# Patient Record
Sex: Female | Born: 1970 | Race: White | Hispanic: No | Marital: Married | Smoking: Never smoker
Health system: Southern US, Community
[De-identification: ages and names within clinical notes are randomized; demographics above are authoritative.]

## PROBLEM LIST (undated history)

## (undated) ENCOUNTER — Inpatient Hospital Stay (HOSPITAL_COMMUNITY): Payer: Self-pay

## (undated) DIAGNOSIS — N83209 Unspecified ovarian cyst, unspecified side: Secondary | ICD-10-CM

## (undated) DIAGNOSIS — R87629 Unspecified abnormal cytological findings in specimens from vagina: Secondary | ICD-10-CM

## (undated) DIAGNOSIS — E119 Type 2 diabetes mellitus without complications: Secondary | ICD-10-CM

## (undated) DIAGNOSIS — B999 Unspecified infectious disease: Secondary | ICD-10-CM

## (undated) DIAGNOSIS — O2200 Varicose veins of lower extremity in pregnancy, unspecified trimester: Secondary | ICD-10-CM

## (undated) HISTORY — PX: OVARIAN CYST REMOVAL: SHX89

## (undated) HISTORY — DX: Type 2 diabetes mellitus without complications: E11.9

## (undated) HISTORY — PX: OOPHORECTOMY: SHX86

---

## 2014-08-15 LAB — OB RESULTS CONSOLE ABO/RH: RH Type: POSITIVE

## 2014-08-15 LAB — OB RESULTS CONSOLE RUBELLA ANTIBODY, IGM: RUBELLA: NON-IMMUNE/NOT IMMUNE

## 2014-08-15 LAB — OB RESULTS CONSOLE HIV ANTIBODY (ROUTINE TESTING): HIV: NONREACTIVE

## 2014-08-15 LAB — OB RESULTS CONSOLE RPR: RPR: NONREACTIVE

## 2014-08-15 LAB — OB RESULTS CONSOLE HEPATITIS B SURFACE ANTIGEN: HEP B S AG: NEGATIVE

## 2014-08-15 LAB — OB RESULTS CONSOLE ANTIBODY SCREEN: Antibody Screen: NEGATIVE

## 2014-08-17 LAB — OB RESULTS CONSOLE GBS: STREP GROUP B AG: NEGATIVE

## 2014-09-05 ENCOUNTER — Encounter: Payer: BC Managed Care – PPO | Attending: Obstetrics and Gynecology | Admitting: Nutrition

## 2014-09-05 ENCOUNTER — Encounter: Payer: Self-pay | Admitting: Nutrition

## 2014-09-05 VITALS — Ht 65.0 in | Wt 192.0 lb

## 2014-09-05 DIAGNOSIS — Z713 Dietary counseling and surveillance: Secondary | ICD-10-CM | POA: Insufficient documentation

## 2014-09-05 DIAGNOSIS — E119 Type 2 diabetes mellitus without complications: Secondary | ICD-10-CM | POA: Insufficient documentation

## 2014-09-05 DIAGNOSIS — IMO0002 Reserved for concepts with insufficient information to code with codable children: Secondary | ICD-10-CM

## 2014-09-05 DIAGNOSIS — E118 Type 2 diabetes mellitus with unspecified complications: Secondary | ICD-10-CM

## 2014-09-05 DIAGNOSIS — E1165 Type 2 diabetes mellitus with hyperglycemia: Secondary | ICD-10-CM

## 2014-09-05 DIAGNOSIS — O24312 Unspecified pre-existing diabetes mellitus in pregnancy, second trimester: Secondary | ICD-10-CM | POA: Diagnosis present

## 2014-09-05 DIAGNOSIS — IMO0001 Reserved for inherently not codable concepts without codable children: Secondary | ICD-10-CM

## 2014-09-05 NOTE — Progress Notes (Signed)
  Medical Nutrition Therapy:  Appt start time: 0830 end time:  0930.   Assessment:  Primary concerns today:  Type 2 Diabetes and currently pregnant.  Here with her mom. Late age pregnancy-high risk at 43 years of age. Currently [redacted] weeks pregnant with a boy. Checks her BS Fasting and 2 hours after meals/snacks. FBS: 112 ng/dl and 657115 mg/dl.  89 before snack, 94-110 mg/dl  2 hrs after meals. Previously was on Janumet for the last year amd FBS were abpit 130 mg/dl.  Prepregnancy weight: 197 lbs. Lost 5 lbs since being pregnant. A1c was 7% before pregnancy. This is her first pregnancy. Current diet is eating three meals a day but not really any snacks and was restricting carbohydrates too much. Denies any significant low blood sugars. Currently taking 1500 mg of Metformin daily and Prenatal vitamins.  Preferred Learning Style:    Visual  Hands on  No preference indicated   Learning Readiness:     Ready  Change in progress EDICATIONS: see list   DIETARY INTAKE:  24-hr recall:  B ( AM):  1 cup of cherrios and 1/2 c whole milk, coffee with 2 splenda, tbls ff creamer Snk ( AM): apple,  L ( PM): Salad OR Hamburger patty and broccoli, chicken salad and fruit cup, unsweet tea and water:  Chicken nuggets, 5 and small slaw. Snk ( PM): none D ( PM): string beans, pork chop, 3 oz , Snk ( PM): 2 sugar free chocolate cookies Beverages: water, unsweet tea, coffee  Usual physical activity: walks during her lunch hour for 15-20 minutes.  Estimated energy needs: 2000 calories 225 g carbohydrates 150 g protein 56 g fat  Progress Towards Goal(s):  In progress.   Nutritional Diagnosis:  NB-1.1 Food and nutrition-related knowledge deficit As related to Type 2 Diabetes and being pregnant.  As evidenced by A1C 7% and [redacted] weeks pregnant..    Intervention:  Nutrition counseling for diabetes during pregnancy, meal planning, CHO counting, portion control and target ranges for BS during  pregnancy.  Plan:  Aim for 2-3 Carb Choices per meal (30-45 grams) ; 30 g for breakfast and 45-50 g for lunch and dinner each. Aim for 30 g Carbs  Plus 1 oz of protein per snack between meals .Include protein in moderation with your meals and snacks Consider reading food labels for Total Carbohydrate  Consider  increasing your activity level by 30 minutes daily as tolerated Continue checking BG at alternate times per day as directed by MD  Try to keep 1 hr BS less than 140 or the 2 hour BS after meal less than 120 mg/dl. Goal: Keep BS WNL during pregnancy 2. Gain 1-2 lbs per month for normal pregnancy weight gain. Avoid weight loss 3. Talk to OBGYN about high risk clinic due to age, DM and pregnancy. Follow Prenatal meal plan as outlined. Talk to OBGYN about prescriptions for additional strips for testing.  Teaching Method Utilized:  Visual Auditory Hands on  Handouts given during visit include:      Diabetes During Pregnancy Booklet      My Plate Carb Counting and Food Label handouts Meal Plan Card   Barriers to learning/adherence to lifestyle change: none  Demonstrated degree of understanding via:  Teach Back   Monitoring/Evaluation:  Dietary intake, exercise, meal planning, SBG, and body weight in 2 week(s).

## 2014-09-05 NOTE — Patient Instructions (Signed)
Plan:  Aim for 2-3 Carb Choices per meal (30-45 grams) ; 30 g for breakfast and 45-50 g for lunch and dinner each. Aim for 30 g Carbs  Plus 1 oz of protein per snack between meals .Include protein in moderation with your meals and snacks Consider reading food labels for Total Carbohydrate  Consider  increasing your activity level by 30 minutes daily as tolerated Continue checking BG at alternate times per day as directed by MD  Goal: Keep BS WNL during pregnancy 2. Gain 1-2 lbs per month for normal pregnancy weight gain. Avoid weight loss 3. Talk to OBGYN about high risk clinic due to age, DM and pregnancy. Follow Prenatal meal plan as outlined. Talk to OBGYN about prescriptions for additional strips for testing.

## 2014-09-17 ENCOUNTER — Encounter: Payer: BC Managed Care – PPO | Attending: Obstetrics and Gynecology | Admitting: Nutrition

## 2014-09-17 DIAGNOSIS — O24419 Gestational diabetes mellitus in pregnancy, unspecified control: Secondary | ICD-10-CM | POA: Diagnosis not present

## 2014-09-17 DIAGNOSIS — Z713 Dietary counseling and surveillance: Secondary | ICD-10-CM | POA: Diagnosis not present

## 2014-09-17 DIAGNOSIS — O24919 Unspecified diabetes mellitus in pregnancy, unspecified trimester: Secondary | ICD-10-CM | POA: Diagnosis present

## 2014-09-17 DIAGNOSIS — O24912 Unspecified diabetes mellitus in pregnancy, second trimester: Secondary | ICD-10-CM

## 2014-09-17 NOTE — Progress Notes (Signed)
  Medical Nutrition Therapy:  Appt start time: 0830 end time:  0930.   Assessment:  Primary concerns today:  Type 2 Diabetes and currently pregnant. BS Log brought in FBS: 107-130's. After  Breakfast 91-117 2 hrs after lunch:  109-140 2 hrs after supper:   85-136. 98% of blood sugars are WNL. She is 19 weeks 2 days pregnant. U/S was done and everything is on target. Gain 2 lbs since visit.  Denies any significant low blood sugars. She can tell when her blood sugars is getting low and it's time to eat. Appetite good. Sticking with eating 30-45 g of CHO at meals and 15-30 g for snacks between meals. Getting regular exercise by walking when it's not raining.Currently taking 1500 mg of Metformin daily and Prenatal vitamins.  Preferred Learning Style:   Visual  Hands on  No preference indicated   Learning Readiness:   Ready  Change in progress EDICATIONS: see list   DIETARY INTAKE:  24-hr recall:  B ( AM):  Whole wheat toast 2 slices and pb.  Snk ( AM): apple, cheese stick L ( PM): walnut chicken salad,  1 cup fruit 1 crissant  Snk ( PM): yogurt D ( PM): [place] stew 1 cup, apple, water Snk ( PM): small bowl of cherrios. Beverages: water, unsweet tea, coffee  Usual physical activity: walks during her lunch hour for 15-20 minutes.  Estimated energy needs: 2000 calories 225 g carbohydrates 150 g protein 56 g fat  Progress Towards Goal(s):  In progress.   Nutritional Diagnosis:  NB-1.1 Food and nutrition-related knowledge deficit As related to Type 2 Diabetes and being pregnant.  As evidenced by A1C 7% and [redacted] weeks pregnant..    Intervention:  Nutrition counseling for diabetes during pregnancy, meal planning, CHO counting, portion control and target ranges for BS during pregnancy.Discussed hormonal changes during pregnancy and potential need for short duration of insulin during later stages of pregnancy if BS consistently are elevated > 140 mg/dl 2 hours after meals or >100  mg/dl fasting. Encouraged her to discuss with OB GYN.  Plan:  Keep up the good wok!!! Aim for 2-3 Carb Choices per meal (30-45 grams) ; 30 g for breakfast and 45-50 g for lunch and dinner each. Aim for 30 g Carbs  Plus 1 oz of protein per snack between meals .Include protein in moderation with your meals and snacks Consider reading food labels for Total Carbohydrate  Consider  increasing your activity level by 30 minutes daily as tolerated Continue checking BG at alternate times per day as directed by MD  Try to keep 1 hr BS less than 140 or the 2 hour BS after meal less than 120 mg/dl. Goal: Keep BS WNL during pregnancy 2. Gain 1-2 lbs per month for normal pregnancy weight gain. Avoid weight loss   Teaching Method Utilized:  Visual Auditory Hands on  Handouts given during visit include:      Diabetes During Pregnancy Booklet      My Plate Carb Counting and Food Label handouts Meal Plan Card   Barriers to learning/adherence to lifestyle change: none  Demonstrated degree of understanding via:  Teach Back   Monitoring/Evaluation:  Dietary intake, exercise, meal planning, SBG, and body weight PRN.

## 2014-09-17 NOTE — Patient Instructions (Signed)
Keep up the good wok!!! Aim for 2-3 Carb Choices per meal (30-45 grams) ; 30 g for breakfast and 45-50 g for lunch and dinner each. Aim for 30 g Carbs  Plus 1 oz of protein per snack between meals .Include protein in moderation with your meals and snacks Consider reading food labels for Total Carbohydrate  Consider  increasing your activity level by 30 minutes daily as tolerated Continue checking BG at alternate times per day as directed by MD  Try to keep 1 hr BS less than 140 or the 2 hour BS after meal less than 120 mg/dl. Goal: Keep BS WNL during pregnancy 2. Gain 1-2 lbs per month for normal pregnancy weight gain. Avoid weight loss

## 2014-09-21 NOTE — L&D Delivery Note (Signed)
Vtx at +2 station and LOA.  Pt desires VE.  I discussed the R&B of VE including but not limited to injury to fetus but the potential benefit of expedited delivery.  She gives her informed consent and wishes to proceed. On the 2nd pull delivered viable female apgars 8,9 over post vag wall lac   Placenta delivered spontaneously intact with 3VC. Repair with 2-0 Chromic with good support and hemostasis noted and R/V exam confirms.  PH art was sent.  [name] was not done.  Mother and baby were doing well.  EBL 200cc  Gina Campavid Hiyab Nhem, MD

## 2014-10-11 ENCOUNTER — Inpatient Hospital Stay (HOSPITAL_COMMUNITY)
Admission: AD | Admit: 2014-10-11 | Discharge: 2014-10-15 | DRG: 781 | Disposition: A | Discharge status: Home / Self Care | Payer: BLUE CROSS/BLUE SHIELD | Source: Ambulatory Visit | Attending: Obstetrics & Gynecology | Admitting: Obstetrics & Gynecology

## 2014-10-11 ENCOUNTER — Encounter (HOSPITAL_COMMUNITY): Payer: Self-pay | Admitting: *Deleted

## 2014-10-11 DIAGNOSIS — N883 Incompetence of cervix uteri: Secondary | ICD-10-CM

## 2014-10-11 DIAGNOSIS — O09512 Supervision of elderly primigravida, second trimester: Secondary | ICD-10-CM | POA: Diagnosis not present

## 2014-10-11 DIAGNOSIS — Z3A22 22 weeks gestation of pregnancy: Secondary | ICD-10-CM | POA: Diagnosis present

## 2014-10-11 DIAGNOSIS — E119 Type 2 diabetes mellitus without complications: Secondary | ICD-10-CM | POA: Diagnosis present

## 2014-10-11 DIAGNOSIS — O3432 Maternal care for cervical incompetence, second trimester: Secondary | ICD-10-CM | POA: Diagnosis present

## 2014-10-11 DIAGNOSIS — O24112 Pre-existing diabetes mellitus, type 2, in pregnancy, second trimester: Secondary | ICD-10-CM | POA: Diagnosis present

## 2014-10-11 LAB — CBC
HCT: 35.9 % — ABNORMAL LOW (ref 36.0–46.0)
HEMOGLOBIN: 12.4 g/dL (ref 12.0–15.0)
MCH: 29.6 pg (ref 26.0–34.0)
MCHC: 34.5 g/dL (ref 30.0–36.0)
MCV: 85.7 fL (ref 78.0–100.0)
Platelets: 235 10*3/uL (ref 150–400)
RBC: 4.19 MIL/uL (ref 3.87–5.11)
RDW: 13.5 % (ref 11.5–15.5)
WBC: 12.7 10*3/uL — ABNORMAL HIGH (ref 4.0–10.5)

## 2014-10-11 MED ORDER — METFORMIN HCL 500 MG PO TABS
500.0000 mg | ORAL_TABLET | Freq: Three times a day (TID) | ORAL | Status: DC
  Administered 2014-10-12 – 2014-10-15 (×9): 500 mg via ORAL
  Filled 2014-10-11 (×14): qty 1

## 2014-10-11 MED ORDER — DEXTROSE IN LACTATED RINGERS 5 % IV SOLN
INTRAVENOUS | Status: DC
  Administered 2014-10-12 – 2014-10-14 (×2): via INTRAVENOUS

## 2014-10-11 MED ORDER — DOCUSATE SODIUM 100 MG PO CAPS
100.0000 mg | ORAL_CAPSULE | Freq: Every day | ORAL | Status: DC
  Administered 2014-10-13: 100 mg via ORAL
  Filled 2014-10-11: qty 1

## 2014-10-11 MED ORDER — PROGESTERONE MICRONIZED 200 MG PO CAPS
200.0000 mg | ORAL_CAPSULE | Freq: Every day | ORAL | Status: DC
  Administered 2014-10-11 – 2014-10-14 (×4): 200 mg via VAGINAL
  Filled 2014-10-11 (×5): qty 1

## 2014-10-11 MED ORDER — ZOLPIDEM TARTRATE 5 MG PO TABS
5.0000 mg | ORAL_TABLET | Freq: Every evening | ORAL | Status: DC | PRN
  Administered 2014-10-11: 5 mg via ORAL
  Filled 2014-10-11: qty 1

## 2014-10-11 MED ORDER — PRENATAL MULTIVITAMIN CH
1.0000 | ORAL_TABLET | Freq: Every day | ORAL | Status: DC
  Administered 2014-10-12 – 2014-10-13 (×2): 1 via ORAL
  Filled 2014-10-11 (×2): qty 1

## 2014-10-11 MED ORDER — CALCIUM CARBONATE ANTACID 500 MG PO CHEW: 2.0000 | CHEWABLE_TABLET | ORAL | Status: DC | PRN

## 2014-10-11 MED ORDER — ACETAMINOPHEN 325 MG PO TABS
650.0000 mg | ORAL_TABLET | ORAL | Status: DC | PRN
  Administered 2014-10-13: 650 mg via ORAL
  Filled 2014-10-11 (×2): qty 2

## 2014-10-11 NOTE — H&P (Signed)
[name] is a 44 y.o. female presenting for cervical incompetence.  She presented for routine OB appointment today and had ultrasound for completion of anatomy screen.  No measurable cervix was incidentally found.  Patient denies pelvic pressure and CTX.  +FM.  Antepartum course complicated by AMA with normal Panorama.  Also, Type 2 DM controlled with metformin 500 bid.    Maternal Medical History:  Fetal activity: Perceived fetal activity is normal.   Last perceived fetal movement was within the past hour.    Prenatal complications: no prenatal complications Prenatal Complications - Diabetes: type 2. Diabetes is managed by oral agent (monotherapy).      OB History    Gravida Para Term Preterm AB TAB SAB Ectopic Multiple Living   1              Past Medical History  Diagnosis Date  . Diabetes mellitus without complication    Past Surgical History  Procedure Laterality Date  . Ovarian cyst removal    . Oophorectomy     Family History: family history is not on file. Social History:  has no tobacco, alcohol, and drug history on file.   Prenatal Transfer Tool  Maternal Diabetes: Yes:  Diabetes Type:  Pre-pregnancy Genetic Screening: Normal Maternal Ultrasounds/Referrals: Abnormal:  Findings:   Other:no measurable cervix Fetal Ultrasounds or other Referrals:  None Maternal Substance Abuse:  No Significant Maternal Medications:  Meds include: Other: Metformin Significant Maternal Lab Results:  None Other Comments:  None  Review of Systems  Gastrointestinal: Negative for abdominal pain.  Musculoskeletal: Negative for back pain.      There were no vitals taken for this visit. Maternal Exam:  Abdomen: Patient reports no abdominal tenderness. Fundal height is c/w dates.       Physical Exam  Constitutional: She is oriented to person, place, and time. She appears well-developed and well-nourished.  GI: Soft. There is no rebound and no guarding.  Neurological: She is  alert and oriented to person, place, and time.  Skin: Skin is warm and dry.  Psychiatric: She has a normal mood and affect. Her behavior is normal.    Prenatal labs: ABO, Rh:   Antibody:   Rubella:   RPR:    HBsAg:    HIV:    GBS:     Assessment/Plan: 43yo G1 at 6180w4d with cervical incompetence -MFM u/s in AM to evaluate for cerclage placement -NPO after MN -Start vaginal progesterone -Type 2 DM-Continue metformin, monitor CBGs  Astor Gentle 10/11/2014, 9:50 PM

## 2014-10-12 ENCOUNTER — Inpatient Hospital Stay (HOSPITAL_COMMUNITY): Payer: BLUE CROSS/BLUE SHIELD | Admitting: Anesthesiology

## 2014-10-12 ENCOUNTER — Encounter (HOSPITAL_COMMUNITY)
Admission: AD | Disposition: A | Discharge status: Home / Self Care | Payer: Self-pay | Source: Ambulatory Visit | Attending: Obstetrics & Gynecology

## 2014-10-12 ENCOUNTER — Encounter (HOSPITAL_COMMUNITY): Payer: Self-pay | Admitting: *Deleted

## 2014-10-12 ENCOUNTER — Inpatient Hospital Stay (HOSPITAL_COMMUNITY): Payer: BLUE CROSS/BLUE SHIELD

## 2014-10-12 DIAGNOSIS — Z3A22 22 weeks gestation of pregnancy: Secondary | ICD-10-CM | POA: Insufficient documentation

## 2014-10-12 DIAGNOSIS — N883 Incompetence of cervix uteri: Secondary | ICD-10-CM | POA: Diagnosis present

## 2014-10-12 HISTORY — PX: CERVICAL CERCLAGE: SHX1329

## 2014-10-12 LAB — GLUCOSE, CAPILLARY
GLUCOSE-CAPILLARY: 97 mg/dL (ref 70–99)
Glucose-Capillary: 108 mg/dL — ABNORMAL HIGH (ref 70–99)
Glucose-Capillary: 134 mg/dL — ABNORMAL HIGH (ref 70–99)

## 2014-10-12 SURGERY — CERCLAGE, CERVIX, VAGINAL APPROACH
Anesthesia: Spinal | Site: Cervix

## 2014-10-12 MED ORDER — INDOMETHACIN 25 MG PO CAPS
25.0000 mg | ORAL_CAPSULE | Freq: Two times a day (BID) | ORAL | Status: AC
  Administered 2014-10-12 – 2014-10-14 (×4): 25 mg via ORAL
  Filled 2014-10-12 (×4): qty 1

## 2014-10-12 MED ORDER — FENTANYL CITRATE 0.05 MG/ML IJ SOLN
INTRAMUSCULAR | Status: AC
  Filled 2014-10-12: qty 2

## 2014-10-12 MED ORDER — INDOMETHACIN 50 MG PO CAPS
50.0000 mg | ORAL_CAPSULE | Freq: Once | ORAL | Status: AC
  Administered 2014-10-12: 50 mg via ORAL
  Filled 2014-10-12: qty 1

## 2014-10-12 MED ORDER — FENTANYL CITRATE 0.05 MG/ML IJ SOLN
INTRAMUSCULAR | Status: DC | PRN
  Administered 2014-10-12 (×2): 25 ug via INTRAVENOUS
  Administered 2014-10-12: 50 ug via INTRAVENOUS

## 2014-10-12 MED ORDER — DEXTROSE 5 % IV SOLN
2.0000 g | INTRAVENOUS | Status: DC
  Filled 2014-10-12: qty 2

## 2014-10-12 MED ORDER — CEFOTETAN DISODIUM 2 G IJ SOLR
2.0000 g | INTRAMUSCULAR | Status: DC | PRN
  Administered 2014-10-12: 2 g via INTRAVENOUS

## 2014-10-12 MED ORDER — LIDOCAINE IN DEXTROSE 5-7.5 % IV SOLN
INTRAVENOUS | Status: AC
  Filled 2014-10-12: qty 2

## 2014-10-12 MED ORDER — DEXTROSE 5 % IV SOLN
1.0000 g | Freq: Two times a day (BID) | INTRAVENOUS | Status: AC
  Administered 2014-10-12 – 2014-10-14 (×4): 1 g via INTRAVENOUS
  Filled 2014-10-12 (×4): qty 1

## 2014-10-12 MED ORDER — LACTATED RINGERS IV SOLN
INTRAVENOUS | Status: DC | PRN
  Administered 2014-10-12 (×2): via INTRAVENOUS

## 2014-10-12 MED ORDER — CITRIC ACID-SODIUM CITRATE 334-500 MG/5ML PO SOLN
ORAL | Status: AC
  Administered 2014-10-12: 30 mL
  Filled 2014-10-12: qty 15

## 2014-10-12 SURGICAL SUPPLY — 19 items
CATH FOLEY 2WAY SLVR 30CC 16FR (CATHETERS) ×3 IMPLANT
CLOTH BEACON ORANGE TIMEOUT ST (SAFETY) ×3 IMPLANT
COUNTER NEEDLE 1200 MAGNETIC (NEEDLE) ×3 IMPLANT
GAUZE SPONGE 4X4 16PLY XRAY LF (GAUZE/BANDAGES/DRESSINGS) ×3 IMPLANT
GLOVE BIO SURGEON STRL SZ8 (GLOVE) ×3 IMPLANT
GLOVE BIOGEL PI IND STRL 7.0 (GLOVE) ×4 IMPLANT
GLOVE BIOGEL PI INDICATOR 7.0 (GLOVE) ×8
GLOVE SURG ORTHO 8.0 STRL STRW (GLOVE) ×6 IMPLANT
GLOVE SURG SS PI 7.0 STRL IVOR (GLOVE) ×15 IMPLANT
GOWN STRL REUS W/TWL LRG LVL3 (GOWN DISPOSABLE) ×12 IMPLANT
NS IRRIG 1000ML POUR BTL (IV SOLUTION) ×3 IMPLANT
PACK VAGINAL MINOR WOMEN LF (CUSTOM PROCEDURE TRAY) ×3 IMPLANT
PAD OB MATERNITY 4.3X12.25 (PERSONAL CARE ITEMS) ×3 IMPLANT
PAD PREP 24X48 CUFFED NSTRL (MISCELLANEOUS) ×3 IMPLANT
SUT PROLENE 0 CT 1 30 (SUTURE) ×6 IMPLANT
SYR 30ML LL (SYRINGE) ×3 IMPLANT
TOWEL OR 17X24 6PK STRL BLUE (TOWEL DISPOSABLE) ×6 IMPLANT
TRAY FOLEY CATH 14FR (SET/KITS/TRAYS/PACK) ×3 IMPLANT
WATER STERILE IRR 1000ML POUR (IV SOLUTION) ×3 IMPLANT

## 2014-10-12 NOTE — Transfer of Care (Signed)
Immediate Anesthesia Transfer of Care Note  Patient: [name]  Procedure(s) Performed: Procedure(s): CERCLAGE CERVICAL (N/A)  Patient Location: Mother/Baby  Anesthesia Type:Spinal  Level of Consciousness: awake, alert , oriented and patient cooperative  Airway & Oxygen Therapy: Patient Spontanous Breathing  Post-op Assessment: Report given to PACU RN and Post -op Vital signs reviewed and stable  Post vital signs: Reviewed and stable  Complications: No apparent anesthesia complications

## 2014-10-12 NOTE — Op Note (Signed)
[name], MACBETH              ACCOUNT NO.:  [id number]  MEDICAL RECORD NO.:  [id number]  LOCATION:  9161                          FACILITY:  [facility]  PHYSICIAN:  Dineen Kid. Rana Snare, M.D.    DATE OF BIRTH:  02-Dec-1970  DATE OF PROCEDURE:  10/12/2014 DATE OF DISCHARGE:                              OPERATIVE REPORT   PREOPERATIVE DIAGNOSIS:  Incompetent cervix at 42 and 5/7 th weeks.  POSTOPERATIVE DIAGNOSIS:  Incompetent cervix at 42 and 5/7 th weeks.  PROCEDURE:  Emergent [employer] cervical cerclage.  SURGEON:  Dineen Kid. Rana Snare, MD  ANESTHESIA:  Spinal.  INDICATIONS:  [name] is a 44 year old, G1, who presented for routine obstetrical care at 22 weeks with a repeat ultrasound to complete her anatomic survey and noted to have no measurable cervix and funneling of the cervix.  She was admitted for hospitalization, placed in Trendelenburg, and no contractions were noted.  Followup ultrasound today was reviewed by Maternal Fetal Medicine, and due to no measurable cervix left, they had recommended and we had discussed with the family the possibility of an emergent cerclage.  We had discussed the risks and benefits, the pros and cons at length.  The risks include but not limited to risk of infection, bleeding, damage to bowel, bladder, uterus, fetus, possibility that we could have rupture of membranes could cause miscarriage, it may or may not prolong the pregnancy.  They do gave their informed consent.  FINDINGS AT THE TIME OF SURGERY:  Minimal cervical tissue noted more anteriorly than posteriorly.  The membranes were not hourglassing.  DESCRIPTION OF PROCEDURE:  After adequate analgesia, the patient was placed in the dorsal lithotomy position.  She was sterilely prepped and draped in the dorsal lithotomy position.  The bladder was not drained. We elected Foley intentionally.  A Heaney ring forceps was used at the 12 o'clock position to identify approximately 1-1.5-cm cervix.  A  0 Prolene was inserted at the 12 o'clock position, we made at the 10 o'clock position.  We inserted at the 7 to 6 o'clock position and at the 6 to 5 o'clock position and then the 10 and 12 o'clock positions.  After this was tied securely palpation by the operator's fingertips felt that the posterior 6 o'clock position had slipped through the cerclage, still had good support of the remaining portion of the cervix.  So a second cerclage was placed.  After further evaluation, we were able to grasp the 6 o'clock position with a Heaney ring forceps and a second cerclage was placed beginning at 12-10, 7-6, 6-5 and then 10-12 secured approximately about 1.5 cm from the external os.  This was tied down securely and by the operator's fingertips the posterior cervix was still part of this cerclage with good results noted.  It was tied at the 12 o'clock position along with the other suture and both were then cut.  So at the completion of the surgery, we have two cerclages, one that did not include the 6 o'clock position, the other did.  Minimal bleeding was noted.  No evidence of leaking of membranes.  Foley catheter was then placed after the completion of procedure, and the patient was  then transferred to the recovery room and then to the antepartum.  She will be continued in the hospital on bed rest.  We will continue antibiotics for 24-48 hours, and we will begin Indocin for 48 hours as well.  Estimated blood loss was minimal.  No complications noted.     Dineen Kidavid C. Rana SnareLowe, M.D.     DCL/MEDQ  D:  10/12/2014  T:  10/12/2014  Job:  [id number]

## 2014-10-12 NOTE — Anesthesia Procedure Notes (Signed)
Spinal Patient location during procedure: OR Preanesthetic Checklist Completed: patient identified, site marked, surgical consent, pre-op evaluation, timeout performed, IV checked, risks and benefits discussed and monitors and equipment checked Spinal Block Patient position: sitting Prep: DuraPrep Patient monitoring: heart rate, cardiac monitor, continuous pulse ox and blood pressure Approach: midline Location: L3-4 Injection technique: single-shot Needle Needle type: Sprotte  Needle gauge: 24 G Needle length: 9 cm Assessment Sensory level: T8 Additional Notes 1.1cc 5%Xylocaine = SAB

## 2014-10-12 NOTE — Progress Notes (Signed)
   10/12/14 1100  Clinical Encounter Type  Visited With Patient and family together (husband Berna SpareMarcus and dad Tommy)  Visit Type Pre-op;Spiritual support  Referral From Nurse Erven Colla(Melissa [name], RN)  Spiritual Encounters  Spiritual Needs Prayer (prior to cerclage)  Stress Factors  Patient Stress Factors Loss of control  Family Stress Factors Loss of control   Visited briefly with Toniann FailWendy and in more detail afterward with her husband and dad to offer prayer per pt request.  Introduced Spiritual Care and chaplain availability, providing pastoral presence and encouragement to [place] and Tommy.  Spiritual Care will follow, but please also page as needs arise.  Thank you.  [address]Chaplain Jammal Sarr [place], [place] [phone]

## 2014-10-12 NOTE — Progress Notes (Signed)
Noted pt with pre-existing type 2 DM treated with metformin at 24.[redacted] wks gestation presenting with incompetent cerivix for cerclage placement. Will follow patient for potential insulin needs while in-patient. CBG this am at 108 mg/dL (NPO after MN last HS). Glad to assist in any way.  Thank you, Lenor CoffinAnn Redding Cloe, RN, CNS, Diabetes Coordinator [phone])

## 2014-10-12 NOTE — Anesthesia Postprocedure Evaluation (Signed)
  Anesthesia Post-op Note  Patient: [name]  Procedure(s) Performed: Procedure(s): CERCLAGE CERVICAL (N/A)  Patient is awake, responsive, moving her legs, and has signs of resolution of her numbness. Pain and nausea are reasonably well controlled. Vital signs are stable and clinically acceptable. Oxygen saturation is clinically acceptable. There are no apparent anesthetic complications at this time. Patient is ready for discharge.

## 2014-10-12 NOTE — Anesthesia Preprocedure Evaluation (Signed)
Anesthesia Evaluation  Patient identified by MRN, date of birth, ID band Patient awake    Reviewed: Allergy & Precautions, H&P , NPO status , Patient's Chart, lab work & pertinent test results  Airway Mallampati: II       Dental   Pulmonary  breath sounds clear to auscultation        Cardiovascular Exercise Tolerance: Good Rhythm:regular Rate:Normal     Neuro/Psych    GI/Hepatic   Endo/Other  diabetes  Renal/GU      Musculoskeletal   Abdominal   Peds  Hematology   Anesthesia Other Findings   Reproductive/Obstetrics (+) Pregnancy                             Anesthesia Physical Anesthesia Plan  ASA: III  Anesthesia Plan: Spinal   Post-op Pain Management:    Induction:   Airway Management Planned:   Additional Equipment:   Intra-op Plan:   Post-operative Plan:   Informed Consent: I have reviewed the patients History and Physical, chart, labs and discussed the procedure including the risks, benefits and alternatives for the proposed anesthesia with the patient or authorized representative who has indicated his/her understanding and acceptance.     Plan Discussed with: Anesthesiologist, CRNA and Surgeon  Anesthesia Plan Comments:         Anesthesia Quick Evaluation

## 2014-10-12 NOTE — Anesthesia Postprocedure Evaluation (Signed)
Anesthesia Post Note  Patient: [name]  Procedure(s) Performed: Procedure(s) (LRB): CERCLAGE CERVICAL (N/A)  Anesthesia type: Spinal  Patient location: Mother/Baby  Post pain: Pain level controlled  Post assessment: Post-op Vital signs reviewed  Last Vitals:  Filed Vitals:   10/12/14 1212  BP: 119/61  Pulse: 95  Temp:   Resp:     Post vital signs: Reviewed  Level of consciousness: awake  Complications: No apparent anesthesia complications

## 2014-10-12 NOTE — Progress Notes (Signed)
   10/12/14 1400  Clinical Encounter Type  Visited With Patient and family together  Visit Type Follow-up;Spiritual support;Social support  Spiritual Encounters  Spiritual Needs Emotional   Followed up with Toniann FailWendy and [place] after procedure.  Per pt, she was much relieved, despite back pain/ctx discomfort.  She was cheerful, talkative, and very grateful to be finished with OR.  Provided pastoral presence, empathic listening, and emotional support, reminding family of ongoing chaplain availability throughout her pregnancy (BS/ante/NICU/etc).  Family appreciative.  [address]Chaplain Aadit Hagood [place], [place] [phone]

## 2014-10-12 NOTE — Brief Op Note (Signed)
10/11/2014 - 10/12/2014  12:01 PM  PATIENT:  [name]  44 y.o. female  PRE-OPERATIVE DIAGNOSIS:  incompetent cervix  POST-OPERATIVE DIAGNOSIS:  incompetent cervix  PROCEDURE:  Procedure(s): CERCLAGE CERVICAL (N/A)  SURGEON:  Surgeon(s) and Role:    * Turner Danielsavid C Adelaine Roppolo, MD - Primary  PHYSICIAN ASSISTANT:   ASSISTANTS: none   ANESTHESIA:   spinal  EBL:     BLOOD ADMINISTERED:none  DRAINS: Urinary Catheter (Foley)   LOCAL MEDICATIONS USED:  NONE  SPECIMEN:  No Specimen  DISPOSITION OF SPECIMEN:  N/A  COUNTS:  YES  TOURNIQUET:  * No tourniquets in log *  DICTATION: .Other Dictation: Dictation Number 1  PLAN OF CARE: Admit to inpatient   PATIENT DISPOSITION:  PACU - hemodynamically stable.   Delay start of Pharmacological VTE agent (>24hrs) due to surgical blood loss or risk of bleeding: not applicable

## 2014-10-12 NOTE — Progress Notes (Signed)
Pt went to labor and delivery and did not come into pacu

## 2014-10-12 NOTE — Progress Notes (Signed)
Patient ID: Liberty HandyWendy Plouffe, female   DOB: 12/13/1970, 44 y.o.   MRN: [id number] Pt resting in T berg without ctxs.  GFM  VSSAF FHR 140s No ctxs on Toco  Cx  Ft/1-2 cm of cervix thickness/High  By pelvic No hour glassing membranes  US/MFM - no measurable cervix.  Funneling noted thru entire cervix. Dr Cherre RobinsWhiticar (MFM) and I discussed the findings and he recommends proceeding with rescue cerclage attempt  IUP at 22 5/7 weeks Incompetent cervix - I discussed the findings by the US and exam, including MFM recommendations with the patient and her family.  The risks and benefits of cerclage were discussed at length including the risks or PPROM, labor, miscarriage,infection, the possiblity it can't be done, or the that it may not help prolong the pregnancy.  Other risks include infection, bleeding, damage to bowel ,bladder, fetus.  They give their informed consent and wish to proceed.  DL

## 2014-10-12 NOTE — Addendum Note (Signed)
Addendum  created 10/12/14 1750 by Graciela HusbandsWynn O Braven Wolk, CRNA   Modules edited: Notes Section   Notes Section:  File: [id number]

## 2014-10-13 LAB — GLUCOSE, CAPILLARY
GLUCOSE-CAPILLARY: 116 mg/dL — AB (ref 70–99)
GLUCOSE-CAPILLARY: 132 mg/dL — AB (ref 70–99)
Glucose-Capillary: 113 mg/dL — ABNORMAL HIGH (ref 70–99)
Glucose-Capillary: 101 mg/dL — ABNORMAL HIGH (ref 70–99)

## 2014-10-13 NOTE — Progress Notes (Addendum)
Patient ID: [name], female   DOB: 05/24/1971, 44 y.o.   MRN: [id number] Pt without complaints GFM No ctxs, bleeding  VSSAF FHR 140s Ctx irritability only  Abd Gravid nt  Inc Cx POD 1 s/p cerclage (x2) Stable Cont Indocin, Mod BR, Iv abx x 48 hours, prog supp Repeat US on Monday

## 2014-10-14 LAB — GLUCOSE, CAPILLARY
GLUCOSE-CAPILLARY: 117 mg/dL — AB (ref 70–99)
GLUCOSE-CAPILLARY: 97 mg/dL (ref 70–99)
Glucose-Capillary: 89 mg/dL (ref 70–99)

## 2014-10-14 NOTE — Progress Notes (Signed)
Patient ID: [name], female   DOB: 01/24/1971, 44 y.o.   MRN: [id number] Pt comfortable with only rare mild ctxs No bleeding or ROM sxs GFM  VSSAF FHR 140s Ctxs rare  Abd Gravid nt Neg homans bilaterally  IUP at 23 0/7 weeks  Incompetent cervix with emergent cerclage placement on 10/12/14 (POD 2) Stable on Indocin and abx both will be completed today Continue Vaginal Prog Supp q HS BMZ at 24 weeks Plan MFM US and consult tomorrow to discuss plan of care [place] so this may affect disposition

## 2014-10-15 ENCOUNTER — Inpatient Hospital Stay (HOSPITAL_COMMUNITY): Payer: BLUE CROSS/BLUE SHIELD

## 2014-10-15 ENCOUNTER — Encounter (HOSPITAL_COMMUNITY): Payer: Self-pay | Admitting: Obstetrics and Gynecology

## 2014-10-15 LAB — GLUCOSE, CAPILLARY: GLUCOSE-CAPILLARY: 76 mg/dL (ref 70–99)

## 2014-10-15 MED ORDER — PROGESTERONE MICRONIZED 200 MG PO CAPS: 200.0000 mg | ORAL_CAPSULE | Freq: Every day | ORAL | Status: DC

## 2014-10-15 NOTE — Progress Notes (Signed)
Pt without c/o.  Denies ctx and vb.  FHT + toco quiet  Abd - gravid, NT Ext - NT  A/P:  23+1 weeks with incompetent cervix S/p emergent double cerclage (1/22) S/p Indocin & abx Continue vaginal P4 Plan BMZ @ 24 wks MFM consult today

## 2014-10-15 NOTE — Consult Note (Signed)
Maternal Fetal Medicine Consultation  Requesting Provider(s): Dr. Langston Masker  Reason for consultation: Cervical insufficiency s/p emergency cerclage, Type 2 diabetes, Advanced maternal age > 40  HPI: [name] is a 44 yo G1P0 currently at [redacted]w[redacted]d who is currently admitted due to shortened cervix s/p cerclage.  On 1/25 she underwent an ultrasound that showed no measureable cervix with V-shaped funneling and cervical debris.  Her only risk factor was a history of a prior Laser of the cervix for dysplasia.  The patient underwent an emergent cerclage without complications.  Since that time, she has been in house - has completed a 48 hour course of Indocin and IV antibiotics.  She is without complaints today and denies contractions or leakage of fluid.  Ms. Franchini prenatal course has also been complicated by Type 2 diabetes that was first diagnosed about 4 years ago.  Her baseline HbA1C was 7.  She reports that her fingerstick values have improved (reports fasting values 70-123; most post prandial values "OK").  She has not yet had a fetal echo.  Her prenatal course has otherwise been uncomplicated.  OB History: OB History    Gravida Para Term Preterm AB TAB SAB Ectopic Multiple Living   1               PMH:  Past Medical History  Diagnosis Date  . Diabetes mellitus without complication     PSH:  Past Surgical History  Procedure Laterality Date  . Ovarian cyst removal    . Oophorectomy    Laser of cervix  Meds:  No current facility-administered medications on file prior to encounter.   Current Outpatient Prescriptions on File Prior to Encounter  Medication Sig Dispense Refill  . metFORMIN (GLUCOPHAGE) 500 MG tablet Take 500 mg by mouth 3 (three) times daily.     Allergies: No Known Allergies   FH: History reviewed. No pertinent family history. Denies family history of birth defects or hereditary disorders  Soc:  History   Social History  . Marital Status: Married    Spouse  Name: N/A    Number of Children: N/A  . Years of Education: N/A   Occupational History  . Not on file.   Social History Main Topics  . Smoking status: Never Smoker   . Smokeless tobacco: Not on file  . Alcohol Use: No  . Drug Use: No  . Sexual Activity: Yes    Birth Control/ Protection: Pill     Comment: on pill when found out pregnant   Other Topics Concern  . Not on file   Social History Narrative    Review of Systems: no vaginal bleeding or cramping/contractions, no LOF, no nausea/vomiting. All other systems reviewed and are negative.  PE:   Filed Vitals:   10/15/14 0756  BP:   Pulse: 66  Temp:   Resp:     GEN: well-appearing female ABD: gravid, NT  Ultrasound: Single IUP at 23w 1d Type 2 diabetes, cervical insufficiency s/p cerclage Normal fetal anatomic survey Somewhat limited views of the cranium, heart and spine obtained due to fetal position Growth is appropriate (47th %tile)  TVUS - cervical length 3.9 cm.  Some fluid is noted in the endocervical canal with some debris.  The cerclage stitch appears intact  Labs: CBC    Component Value Date/Time   WBC 12.7* 10/11/2014 2210   RBC 4.19 10/11/2014 2210   HGB 12.4 10/11/2014 2210   HCT 35.9* 10/11/2014 2210   PLT 235 10/11/2014 2210  MCV 85.7 10/11/2014 2210   MCH 29.6 10/11/2014 2210   MCHC 34.5 10/11/2014 2210   RDW 13.5 10/11/2014 2210     A/P: 1) Single IUP at 23w 1d         2) Cervical insufficiency s/p emergency cerclage - cerclage stitch appears intact; excellent cervical length achieved.  Feel that the patient may be discharged home.  Would continue vaginal progesterone supplementation.  Would discontinue antibiotics and Indocin.  Concur with plan for betamethasone at 24 weeks.  Recommend follow up ultrasound for cervical length with MFM in 2 weeks.         3) Type 2 diabetes - currently on Metformin 500 mg TID.  Baseline HbA1C was 7, but reports that fingerstick values have improved since  becoming pregnant.  Would recommend repeating HbA1C now and each trimester.  Would anticipate worsening values after betamethasone and as her pregnancy progresses.  There is a good likelihood that she will require Insulin as the pregnancy progresses.         4) Advanced maternal age > 7540.   Recommend: 1) Discharge home.  May discontinue antibiotics and Indocin. 2) Continue vaginal progesterone 3) Betamethasone at 24 weeks. 4) Fetal echo due to pregestational diabetes 5) Follow up cervical length with MFM in 2 weeks. 6) Follow up ultrasound with MFM in 4 weeks for growth and to complete anatomy 7) Antenatal testing to begin at 32 weeks   Thank you for the opportunity to be a part of the care of [name]. Please contact our office if we can be of further assistance.   I spent approximately 30 minutes with this patient with over 50% of time spent in face-to-face counseling.  Alpha GulaPaul Kelsey Edman, MD Maternal Fetal Medicine

## 2014-10-15 NOTE — Progress Notes (Signed)
Pt was unavailable when I went for a follow-up visit today.  Her RN reported that she was doing much better and would be discharged today.  I asked her to page if pt showed signs of additional stress before being discharged.  [id number]Chaplain Katy Citruslaussen Pager, [phone] 4:15 PM    10/15/14 1600  Clinical Encounter Type  Visited With Health care provider

## 2014-10-22 ENCOUNTER — Other Ambulatory Visit (HOSPITAL_COMMUNITY): Payer: Self-pay | Admitting: Obstetrics and Gynecology

## 2014-10-22 DIAGNOSIS — O24112 Pre-existing diabetes mellitus, type 2, in pregnancy, second trimester: Secondary | ICD-10-CM

## 2014-10-22 DIAGNOSIS — O09522 Supervision of elderly multigravida, second trimester: Secondary | ICD-10-CM

## 2014-10-22 DIAGNOSIS — O3432 Maternal care for cervical incompetence, second trimester: Secondary | ICD-10-CM

## 2014-10-29 ENCOUNTER — Ambulatory Visit (HOSPITAL_COMMUNITY): Payer: BLUE CROSS/BLUE SHIELD

## 2014-10-30 ENCOUNTER — Other Ambulatory Visit (HOSPITAL_COMMUNITY): Payer: Self-pay

## 2014-10-31 ENCOUNTER — Other Ambulatory Visit (HOSPITAL_COMMUNITY): Payer: Self-pay

## 2014-10-31 ENCOUNTER — Encounter (HOSPITAL_COMMUNITY): Payer: Self-pay | Admitting: *Deleted

## 2014-10-31 ENCOUNTER — Encounter (HOSPITAL_COMMUNITY): Payer: Self-pay

## 2014-10-31 ENCOUNTER — Inpatient Hospital Stay (HOSPITAL_COMMUNITY)
Admission: AD | Admit: 2014-10-31 | Discharge: 2015-01-04 | DRG: 781 | Disposition: A | Discharge status: Home / Self Care | Payer: BLUE CROSS/BLUE SHIELD | Source: Ambulatory Visit | Attending: Obstetrics and Gynecology | Admitting: Obstetrics and Gynecology

## 2014-10-31 ENCOUNTER — Ambulatory Visit (HOSPITAL_COMMUNITY)
Admission: RE | Admit: 2014-10-31 | Discharge: 2014-10-31 | Disposition: A | Discharge status: Home / Self Care | Payer: BLUE CROSS/BLUE SHIELD | Source: Ambulatory Visit | Attending: Obstetrics and Gynecology | Admitting: Obstetrics and Gynecology

## 2014-10-31 DIAGNOSIS — Z3A25 25 weeks gestation of pregnancy: Secondary | ICD-10-CM | POA: Diagnosis present

## 2014-10-31 DIAGNOSIS — E119 Type 2 diabetes mellitus without complications: Secondary | ICD-10-CM

## 2014-10-31 DIAGNOSIS — Z3A27 27 weeks gestation of pregnancy: Secondary | ICD-10-CM | POA: Diagnosis not present

## 2014-10-31 DIAGNOSIS — O09522 Supervision of elderly multigravida, second trimester: Secondary | ICD-10-CM

## 2014-10-31 DIAGNOSIS — O2202 Varicose veins of lower extremity in pregnancy, second trimester: Secondary | ICD-10-CM | POA: Diagnosis present

## 2014-10-31 DIAGNOSIS — O24312 Unspecified pre-existing diabetes mellitus in pregnancy, second trimester: Secondary | ICD-10-CM | POA: Diagnosis present

## 2014-10-31 DIAGNOSIS — O3432 Maternal care for cervical incompetence, second trimester: Secondary | ICD-10-CM | POA: Insufficient documentation

## 2014-10-31 DIAGNOSIS — Z90721 Acquired absence of ovaries, unilateral: Secondary | ICD-10-CM | POA: Diagnosis present

## 2014-10-31 DIAGNOSIS — O24112 Pre-existing diabetes mellitus, type 2, in pregnancy, second trimester: Secondary | ICD-10-CM

## 2014-10-31 DIAGNOSIS — N883 Incompetence of cervix uteri: Secondary | ICD-10-CM

## 2014-10-31 DIAGNOSIS — O321XX0 Maternal care for breech presentation, not applicable or unspecified: Secondary | ICD-10-CM | POA: Diagnosis not present

## 2014-10-31 DIAGNOSIS — O09512 Supervision of elderly primigravida, second trimester: Secondary | ICD-10-CM

## 2014-10-31 DIAGNOSIS — Z3A31 31 weeks gestation of pregnancy: Secondary | ICD-10-CM | POA: Insufficient documentation

## 2014-10-31 DIAGNOSIS — Z3A34 34 weeks gestation of pregnancy: Secondary | ICD-10-CM | POA: Insufficient documentation

## 2014-10-31 DIAGNOSIS — O09519 Supervision of elderly primigravida, unspecified trimester: Secondary | ICD-10-CM | POA: Diagnosis present

## 2014-10-31 DIAGNOSIS — Z3A29 29 weeks gestation of pregnancy: Secondary | ICD-10-CM

## 2014-10-31 DIAGNOSIS — O24919 Unspecified diabetes mellitus in pregnancy, unspecified trimester: Secondary | ICD-10-CM | POA: Insufficient documentation

## 2014-10-31 DIAGNOSIS — O3430 Maternal care for cervical incompetence, unspecified trimester: Secondary | ICD-10-CM | POA: Insufficient documentation

## 2014-10-31 HISTORY — DX: Unspecified ovarian cyst, unspecified side: N83.209

## 2014-10-31 HISTORY — DX: Varicose veins of lower extremity in pregnancy, unspecified trimester: O22.00

## 2014-10-31 HISTORY — DX: Unspecified infectious disease: B99.9

## 2014-10-31 HISTORY — DX: Unspecified abnormal cytological findings in specimens from vagina: R87.629

## 2014-10-31 LAB — COMPREHENSIVE METABOLIC PANEL
ALT: 14 U/L (ref 0–35)
ANION GAP: 5 (ref 5–15)
AST: 15 U/L (ref 0–37)
Albumin: 3.3 g/dL — ABNORMAL LOW (ref 3.5–5.2)
Alkaline Phosphatase: 34 U/L — ABNORMAL LOW (ref 39–117)
BUN: 8 mg/dL (ref 6–23)
CO2: 21 mmol/L (ref 19–32)
Calcium: 8.7 mg/dL (ref 8.4–10.5)
Chloride: 109 mmol/L (ref 96–112)
Creatinine, Ser: 0.52 mg/dL (ref 0.50–1.10)
GFR calc Af Amer: >90 mL/min (ref >90)
GFR calc non Af Amer: >90 mL/min (ref >90)
GLUCOSE: 82 mg/dL (ref 70–99)
POTASSIUM: 4 mmol/L (ref 3.5–5.1)
SODIUM: 135 mmol/L (ref 135–145)
Total Bilirubin: 0.5 mg/dL (ref 0.3–1.2)
Total Protein: 7.2 g/dL (ref 6.0–8.3)

## 2014-10-31 LAB — URINE MICROSCOPIC-ADD ON

## 2014-10-31 LAB — URINALYSIS, ROUTINE W REFLEX MICROSCOPIC
Bilirubin Urine: NEGATIVE
GLUCOSE, UA: NEGATIVE mg/dL
KETONES UR: NEGATIVE mg/dL
Nitrite: NEGATIVE
Protein, ur: NEGATIVE mg/dL
Urobilinogen, UA: 0.2 mg/dL (ref 0.0–1.0)
pH: 5.5 (ref 5.0–8.0)

## 2014-10-31 LAB — CBC
HEMATOCRIT: 35.7 % — AB (ref 36.0–46.0)
Hemoglobin: 12.3 g/dL (ref 12.0–15.0)
MCH: 29.6 pg (ref 26.0–34.0)
MCHC: 34.5 g/dL (ref 30.0–36.0)
MCV: 85.8 fL (ref 78.0–100.0)
PLATELETS: 216 10*3/uL (ref 150–400)
RBC: 4.16 MIL/uL (ref 3.87–5.11)
RDW: 13.3 % (ref 11.5–15.5)
WBC: 13.2 10*3/uL — AB (ref 4.0–10.5)

## 2014-10-31 LAB — GLUCOSE, CAPILLARY
GLUCOSE-CAPILLARY: 83 mg/dL (ref 70–99)
GLUCOSE-CAPILLARY: 174 mg/dL — AB (ref 70–99)

## 2014-10-31 MED ORDER — CALCIUM CARBONATE ANTACID 500 MG PO CHEW
2.0000 | CHEWABLE_TABLET | ORAL | Status: DC | PRN
  Filled 2014-10-31: qty 2

## 2014-10-31 MED ORDER — DOCUSATE SODIUM 100 MG PO CAPS
100.0000 mg | ORAL_CAPSULE | Freq: Every day | ORAL | Status: DC
  Administered 2014-11-01 – 2015-01-04 (×65): 100 mg via ORAL
  Filled 2014-10-31 (×67): qty 1

## 2014-10-31 MED ORDER — ZOLPIDEM TARTRATE 5 MG PO TABS: 5.0000 mg | ORAL_TABLET | Freq: Every evening | ORAL | Status: DC | PRN

## 2014-10-31 MED ORDER — SODIUM CHLORIDE 0.9 % IV SOLN: 250.0000 mL | INTRAVENOUS | Status: DC | PRN

## 2014-10-31 MED ORDER — BETAMETHASONE SOD PHOS & ACET 6 (3-3) MG/ML IJ SUSP
12.0000 mg | INTRAMUSCULAR | Status: AC
Start: 2014-10-31 — End: 2014-11-02
  Administered 2014-10-31: 12 mg via INTRAMUSCULAR
  Filled 2014-10-31 (×2): qty 2

## 2014-10-31 MED ORDER — METFORMIN HCL 500 MG PO TABS
500.0000 mg | ORAL_TABLET | Freq: Three times a day (TID) | ORAL | Status: DC
Start: 2014-10-31 — End: 2015-01-04
  Administered 2014-10-31 – 2015-01-04 (×192): 500 mg via ORAL
  Filled 2014-10-31 (×199): qty 1

## 2014-10-31 MED ORDER — SODIUM CHLORIDE 0.9 % IJ SOLN
3.0000 mL | INTRAMUSCULAR | Status: DC | PRN
  Administered 2014-11-06: 3 mL via INTRAVENOUS
  Filled 2014-10-31: qty 3

## 2014-10-31 MED ORDER — ACETAMINOPHEN 325 MG PO TABS
650.0000 mg | ORAL_TABLET | ORAL | Status: DC | PRN
Start: 2014-10-31 — End: 2015-01-04
  Administered 2014-11-02 – 2014-11-07 (×2): 650 mg via ORAL
  Filled 2014-10-31 (×2): qty 2

## 2014-10-31 MED ORDER — SODIUM CHLORIDE 0.9 % IJ SOLN
3.0000 mL | Freq: Two times a day (BID) | INTRAMUSCULAR | Status: DC
  Administered 2014-10-31 – 2014-11-10 (×16): 3 mL via INTRAVENOUS

## 2014-10-31 MED ORDER — PRENATAL MULTIVITAMIN CH
1.0000 | ORAL_TABLET | Freq: Every day | ORAL | Status: DC
  Administered 2014-11-01 – 2015-01-04 (×65): 1 via ORAL
  Filled 2014-10-31 (×67): qty 1

## 2014-10-31 MED ORDER — PROGESTERONE MICRONIZED 200 MG PO CAPS
200.0000 mg | ORAL_CAPSULE | Freq: Every day | ORAL | Status: DC
  Administered 2014-10-31 – 2014-11-27 (×28): 200 mg via VAGINAL
  Filled 2014-10-31 (×28): qty 1

## 2014-10-31 NOTE — MAU Provider Note (Signed)
Chief Complaint:  No chief complaint on file.   First Provider Initiated Contact with Patient 10/31/14 1320      HPI: [name] is a 44 y.o. G1P0 at 6571w3d who presents to maternity admissions sent from MFM following ultrasound for no measurable cervix and funneling membranes with cerclage.  Pt had cerclage placed for shortening cervix on 10/12/14 and has been seeing MFM for serial cervical length U/S.  She is currently using vaginal progesterone.  She reports good fetal movement, denies cramping/contractions, LOF, vaginal bleeding, vaginal itching/burning, urinary symptoms, h/a, dizziness, n/v, or fever/chills.     Past Medical History: Past Medical History  Diagnosis Date  . Diabetes mellitus without complication   . Infection     bladder infection  . Ovarian cyst   . Vaginal Pap smear, abnormal     laser removal of abn cells; normal since    Past obstetric history: OB History  Gravida Para Term Preterm AB SAB TAB Ectopic Multiple Living  1             # Outcome Date GA Lbr Len/2nd Weight Sex Delivery Anes PTL Lv  1 Current               Past Surgical History: Past Surgical History  Procedure Laterality Date  . Ovarian cyst removal    . Oophorectomy    . Cervical cerclage N/A 10/12/2014    Procedure: CERCLAGE CERVICAL;  Surgeon: Turner Danielsavid C Lowe, MD;  Location: [facility];  Service: Gynecology;  Laterality: N/A;  . No past surgeries      Family History: Family History  Problem Relation Age of Onset  . Hypertension Mother   . Diabetes Mother   . Stroke Mother   . Heart disease Father   . Hypertension Father   . Stroke Maternal Grandmother   . Cancer Maternal Grandfather     lung  . Heart disease Other     nephew, stint in aorta "narrow"; 2 leaves on aorta    Social History: History  Substance Use Topics  . Smoking status: Never Smoker   . Smokeless tobacco: Never Used  . Alcohol Use: Yes     Comment: rare, none with pregnancy    Allergies: No Known  Allergies  Meds:  Prescriptions prior to admission  Medication Sig Dispense Refill Last Dose  . metFORMIN (GLUCOPHAGE) 500 MG tablet Take 500 mg by mouth 3 (three) times daily.   10/31/2014 at Unknown time  . Prenatal Vit-Fe Fumarate-FA (PRENATAL MULTIVITAMIN) TABS tablet Take 1 tablet by mouth daily at 12 noon.   10/31/2014 at Unknown time  . progesterone 200 MG SUPP Place 200 mg vaginally at bedtime.   10/30/2014 at Unknown time  . progesterone (PROMETRIUM) 200 MG capsule Place 1 capsule (200 mg total) vaginally at bedtime. (Patient not taking: Reported on 10/31/2014) 30 capsule 6 Taking    ROS: Pertinent findings in history of present illness.  Physical Exam  Blood pressure 130/88, pulse 101, temperature 97.9 F (36.6 C), temperature source Oral, resp. rate 18. GENERAL: Well-developed, well-nourished female in no acute distress.  HEENT: normocephalic HEART: normal rate RESP: normal effort ABDOMEN: Soft, non-tender, gravid appropriate for gestational age EXTREMITIES: Nontender, no edema NEURO: alert and oriented Pelvic exam: Cervix pink, visually 1-2 cm, cerclage visible and intact, no pulling across cervical os, membranes visible at os, but not coming through on inspection, small amount white creamy discharge, vaginal walls and external genitalia normal     FHT:  Baseline 145 ,  moderate variability, accelerations present, no decelerations Contractions: None on toco or to palpation   Labs: Results for orders placed or performed during the hospital encounter of 10/31/14 (from the past 24 hour(s))  Urinalysis, Routine w reflex microscopic     Status: Abnormal   Collection Time: 10/31/14 12:45 PM  Result Value Ref Range   Color, Urine YELLOW YELLOW   APPearance HAZY (A) CLEAR   Specific Gravity, Urine <1.005 (L) 1.005 - 1.030   pH 5.5 5.0 - 8.0   Glucose, UA NEGATIVE NEGATIVE mg/dL   Hgb urine dipstick TRACE (A) NEGATIVE   Bilirubin Urine NEGATIVE NEGATIVE   Ketones, ur NEGATIVE  NEGATIVE mg/dL   Protein, ur NEGATIVE NEGATIVE mg/dL   Urobilinogen, UA 0.2 0.0 - 1.0 mg/dL   Nitrite NEGATIVE NEGATIVE   Leukocytes, UA SMALL (A) NEGATIVE  Urine microscopic-add on     Status: Abnormal   Collection Time: 10/31/14 12:45 PM  Result Value Ref Range   Squamous Epithelial / LPF MANY (A) RARE   WBC, UA 0-2 <3 WBC/hpf   RBC / HPF 0-2 <3 RBC/hpf   Bacteria, UA MANY (A) RARE  CBC on admission     Status: Abnormal   Collection Time: 10/31/14  1:38 PM  Result Value Ref Range   WBC 13.2 (H) 4.0 - 10.5 K/uL   RBC 4.16 3.87 - 5.11 MIL/uL   Hemoglobin 12.3 12.0 - 15.0 g/dL   HCT 16.1 (L) 09.6 - 04.5 %   MCV 85.8 78.0 - 100.0 fL   MCH 29.6 26.0 - 34.0 pg   MCHC 34.5 30.0 - 36.0 g/dL   RDW 40.9 81.1 - 91.4 %   Platelets 216 150 - 400 K/uL    Imaging:     Assessment: 44 y.o. G1P0 at [redacted]w[redacted]d 1. Incompetent cervix during second trimester, antepartum   2.  No measurable cervix with funneling membranes  Plan: Consult Dr Henderson Cloud Admit to antepartum Betamethasone x 2 in 24 hours     Medication List    ASK your doctor about these medications        metFORMIN 500 MG tablet  Commonly known as:  GLUCOPHAGE  Take 500 mg by mouth 3 (three) times daily.     prenatal multivitamin Tabs tablet  Take 1 tablet by mouth daily at 12 noon.     progesterone 200 MG capsule  Commonly known as:  PROMETRIUM  Place 1 capsule (200 mg total) vaginally at bedtime.     progesterone 200 MG Supp  Place 200 mg vaginally at bedtime.        Sharen Counter Certified Nurse-Midwife 10/31/2014 2:16 PM

## 2014-10-31 NOTE — ED Notes (Signed)
Report called to Ninfa MeekerJudy Lowe RN.  Pt to MAU.

## 2014-10-31 NOTE — Progress Notes (Signed)
I spent time with [name] at nurses' referral.  She is in good spirits despite anticipating extended time in the hospital.  She reports good support as well.  She requested that I keep her and her baby in prayer, which I will gladly do.  [id number]Cha[address] Dyanne CarrelKaty Jeron Grahn, Bcc Pager, [phone] 3:50 PM    10/31/14 1500  Clinical Encounter Type  Visited With Patient and family together  Visit Type Spiritual support  Referral From Nurse

## 2014-10-31 NOTE — H&P (Signed)
[name] is a 44 y.o. female presenting for incompetent cx. Had rescue cerclage placed at 22 5/7 weeks with 2 sutures. Today had U/S with MFM and had no measurable cervix, vtx. Speculum exam in MAU with CNMW saw external cervical os about 1-2 cm dilated to inspection. Membranes visualized but not prolapsing in to vaginal. Pregnancy also complicated by AMA with normal NIPPS and Type II DM on metformin and normal fetal echo. Maternal Medical History:  Fetal activity: Perceived fetal activity is normal.      OB History    Gravida Para Term Preterm AB TAB SAB Ectopic Multiple Living   1 0        0     Past Medical History  Diagnosis Date  . Diabetes mellitus without complication   . Infection     bladder infection  . Ovarian cyst   . Vaginal Pap smear, abnormal     laser removal of abn cells; normal since  . Varicose veins during pregnancy, antepartum    Past Surgical History  Procedure Laterality Date  . Ovarian cyst removal    . Oophorectomy    . Cervical cerclage N/A 10/12/2014    Procedure: CERCLAGE CERVICAL;  Surgeon: Gina Danielsavid C Lowe, MD;  Location: [facility];  Service: Gynecology;  Laterality: N/A;  . No past surgeries     Family History: family history includes Cancer in her maternal grandfather; Diabetes in her mother; Heart disease in her father and other; Hypertension in her father and mother; Stroke in her maternal grandmother and mother. Social History:  reports that she has never smoked. She has never used smokeless tobacco. She reports that she drinks alcohol. She reports that she does not use illicit drugs.   Prenatal Transfer Tool  Maternal Diabetes: Yes:  Diabetes Type:  Pre-pregnancy Genetic Screening: Normal Maternal Ultrasounds/Referrals: Normal Fetal Ultrasounds or other Referrals:  Referred to Materal Fetal Medicine  Maternal Substance Abuse:  No Significant Maternal Medications:  Meds include: Other: Metformin Significant Maternal Lab Results:  None Other  Comments:  None  Review of Systems  Eyes: Negative for blurred vision.  Gastrointestinal: Negative for abdominal pain.  Neurological: Negative for headaches.      Blood pressure 124/82, pulse 100, temperature 98.3 F (36.8 C), temperature source Oral, resp. rate 18.   Fetal Exam Fetal Monitor Review: Pattern: accelerations present.       Physical Exam  Cardiovascular: Normal rate and regular rhythm.   Respiratory: Effort normal and breath sounds normal.  GI: Soft. There is no tenderness.  Neurological: She has normal reflexes.    Prenatal labs: ABO, Rh: A/Positive/-- (11/25 0000) Antibody: Negative (11/25 0000) Rubella: Nonimmune (11/25 0000) RPR: Nonreactive (11/25 0000)  HBsAg: Negative (11/25 0000)  HIV: Non-reactive (11/25 0000)  GBS:     Assessment/Plan: 44 yo G1P0 @ 25 3/7 weeks Incompetent cervix-cerclage-now with cx dilation    Per MFM-will monitor cervix based on symptoms and exam    Vtx    BMZ ordered    Continue vaginal progesterone  Type II DM    Continue Metformin    Monitor glucose  Magnesium Sulfate for neuroprotection if labors U/S for growth due in 2 weeks Monitor in patient for at least 1 week   Ihor Meinzer II,Kameron Blethen E 10/31/2014, 6:39 PM

## 2014-10-31 NOTE — MAU Note (Signed)
Denies pain, cramping, no bleeding, leaking, wetness. Hips are achy at night.. Yellowish d/c after progesterone suppository- which are yellow. Frequency with urination, no pain or burning. No fever or diarrhea.

## 2014-10-31 NOTE — Progress Notes (Signed)
[facility] ultrasound  Indication: 44 yr old G1P0 at 3029w3d with type II diabetes and cervical insufficiency s/p ultrasound indicated cerclage for cervical length.  Findings: 1. Single intrauterine pregnancy. 2. Posterior placenta without evidence of previa. 3. Normal amniotic fluid volume. 4. There is no measurable cervix transvaginally; the membranes appeared funneled past the cerclage.  Recommendations: 1. Cervical insufficiency: - previously counseled; reiterated increased risk of PPROM, preterm delivery, preterm labor - has cerclage and is on vaginal progesterone (recommend continue) - given change in ultrasound exam recommend speculum exam to evaluate for dilation and integrity of cerclage - recommend admission for betamethasone- discussed benefits in reducing morbidity and mortality associated with prematurity - would recommend monitoring at least a week and then can reassess cervical exam to determine if can be managed as outpatient - do not feel any further transvaginal cervical lengths would be helpful; would monitor via speculum and digital exam - if delivery felt to be imminent prior to 32 weeks recommend magnesium sulfate for fetal neuroprotection  2. Advanced maternal age: - previously counseled - had low risk cell free fetal DNA - recommend serial fetal growth 3. Diabetes: - previously counseled - normal fetal echocardiogram - recommend fetal growth every 4 weeks; due in 2 weeks - recommend antenatal testing starting at 32 weeks - recommend close surveillance of blood sugars (expected to increase after betamethasone)  Discussed with Dr. Henderson Cloudomblin; patient sent to MAU  Eulis FosterKristen Audi Wettstein, MD

## 2014-10-31 NOTE — MAU Note (Signed)
On bed pan

## 2014-10-31 NOTE — Consult Note (Signed)
Neonatology Consult Note:  At the request of the patients obstetrician Dr. Gaetano Net I met with Hendricks Limes who is at 2 3 weeks currently with pregnancy complicated by cervical insufficiency s/p emergency cerclage at 22 weeks, Type 2 diabetes on metrformin and Advanced maternal age.  She was admitted today due to an U/S which showed no measurable cervix and funneling membranes.  She has received her first dose of betamethasone on 2/10.   We discussed morbidity/mortality at this gestional age, delivery room resuscitation, including intubation and surfactant in DR.  Discussed mechanical ventilation and risk for chronic lung disease, risk for IVH with potential for motor / cognitive deficits, ROP, NEC, sepsis, as well as temperature instability and feeding immaturity.  Discussed NG / OG feeds, benefits of MBM in reducing incidence of NEC.   Discussed likely length of stay. Thank you for allowing [place] to participate in her care.  Please call with questions.  Higinio Roger, DO  Neonatologist  The total length of face-to-face or floor / unit time for this encounter was 25 minutes.  Counseling and / or coordination of care was greater than fifty percent of the time.

## 2014-11-01 LAB — GLUCOSE, CAPILLARY
GLUCOSE-CAPILLARY: 130 mg/dL — AB (ref 70–99)
GLUCOSE-CAPILLARY: 209 mg/dL — AB (ref 70–99)
Glucose-Capillary: 116 mg/dL — ABNORMAL HIGH (ref 70–99)
Glucose-Capillary: 129 mg/dL — ABNORMAL HIGH (ref 70–99)
Glucose-Capillary: 187 mg/dL — ABNORMAL HIGH (ref 70–99)

## 2014-11-01 LAB — TYPE AND SCREEN
ABO/RH(D): A POS
Antibody Screen: NEGATIVE

## 2014-11-01 LAB — ABO/RH: ABO/RH(D): A POS

## 2014-11-01 NOTE — Progress Notes (Signed)
Antenatal Nutrition Assessment:  Currently  25 4/[redacted] weeks gestation, with incomp cervix. Height  65 "  Weight 186 lbs  pre-pregnancy weight 195 lbs .  Pre-pregnancy  BMI 32.5  IBW 125 lbs Total weight gain 0.lbs Weight gain goals 11-20 lbs Estimated needs: 2000-2200 kcal/day, 85-95 grams protein/day, 2.3 liters fluid/day  Carbohydrate modified gestational diet tolerated well, appetite good. Has no Hx of poor appetite or nausea as etiology of weight loss Current diet prescription will provide for increased needs.  Nutrition related labs  CBG (last 3)   Recent Labs  10/31/14 1635 10/31/14 2115 11/01/14 0932  GLUCAP 83 174* 116*      Nutrition Dx: Increased nutrient needs r/t pregnancy and fetal growth requirements aeb [redacted] weeks gestation.  No educational needs assessed at this time.Pt received diet education at her endocrinologists office on 12/16 and 12/28. She is well versed in [facility] counting/diet parameters  Gina CaraKatherine Kenadie [name] M.Odis LusterEd. R.D. LDN Neonatal Nutrition Support Specialist/RD III Pager [phone]

## 2014-11-01 NOTE — Progress Notes (Signed)
Patient is doing well. No complaints. Denies contractions or vaginal bleeding.  BP 116/70 mmHg  Pulse 104  Temp(Src) 98.2 F (36.8 C) (Oral)  Resp 16  Ht  (1.651 m)  Wt 84.369 kg (186 lb)  BMI 30.95 kg/m2 Results for orders placed or performed during the hospital encounter of 10/31/14 (from the past 24 hour(s))  Urinalysis, Routine w reflex microscopic     Status: Abnormal   Collection Time: 10/31/14 12:45 PM  Result Value Ref Range   Color, Urine YELLOW YELLOW   APPearance HAZY (A) CLEAR   Specific Gravity, Urine <1.005 (L) 1.005 - 1.030   pH 5.5 5.0 - 8.0   Glucose, UA NEGATIVE NEGATIVE mg/dL   Hgb urine dipstick TRACE (A) NEGATIVE   Bilirubin Urine NEGATIVE NEGATIVE   Ketones, ur NEGATIVE NEGATIVE mg/dL   Protein, ur NEGATIVE NEGATIVE mg/dL   Urobilinogen, UA 0.2 0.0 - 1.0 mg/dL   Nitrite NEGATIVE NEGATIVE   Leukocytes, UA SMALL (A) NEGATIVE  Urine microscopic-add on     Status: Abnormal   Collection Time: 10/31/14 12:45 PM  Result Value Ref Range   Squamous Epithelial / LPF MANY (A) RARE   WBC, UA 0-2 <3 WBC/hpf   RBC / HPF 0-2 <3 RBC/hpf   Bacteria, UA MANY (A) RARE  CBC on admission     Status: Abnormal   Collection Time: 10/31/14  1:38 PM  Result Value Ref Range   WBC 13.2 (H) 4.0 - 10.5 K/uL   RBC 4.16 3.87 - 5.11 MIL/uL   Hemoglobin 12.3 12.0 - 15.0 g/dL   HCT 45.4 (L) 09.8 - 11.9 %   MCV 85.8 78.0 - 100.0 fL   MCH 29.6 26.0 - 34.0 pg   MCHC 34.5 30.0 - 36.0 g/dL   RDW 14.7 82.9 - 56.2 %   Platelets 216 150 - 400 K/uL  Comprehensive metabolic panel     Status: Abnormal   Collection Time: 10/31/14  1:38 PM  Result Value Ref Range   Sodium 135 135 - 145 mmol/L   Potassium 4.0 3.5 - 5.1 mmol/L   Chloride 109 96 - 112 mmol/L   CO2 21 19 - 32 mmol/L   Glucose, Bld 82 70 - 99 mg/dL   BUN 8 6 - 23 mg/dL   Creatinine, Ser 1.30 0.50 - 1.10 mg/dL   Calcium 8.7 8.4 - 86.5 mg/dL   Total Protein 7.2 6.0 - 8.3 g/dL   Albumin 3.3 (L) 3.5 - 5.2 g/dL   AST 15 0  - 37 U/L   ALT 14 0 - 35 U/L   Alkaline Phosphatase 34 (L) 39 - 117 U/L   Total Bilirubin 0.5 0.3 - 1.2 mg/dL   GFR calc non Af Amer >90 >90 mL/min   GFR calc Af Amer >90 >90 mL/min   Anion gap 5 5 - 15  Glucose, capillary     Status: None   Collection Time: 10/31/14  4:35 PM  Result Value Ref Range   Glucose-Capillary 83 70 - 99 mg/dL   Comment 1 Notify RN    Comment 2 Document in Chart   Type and screen     Status: None   Collection Time: 10/31/14  6:08 PM  Result Value Ref Range   ABO/RH(D) A POS    Antibody Screen NEG    Sample Expiration 11/03/2014   ABO/Rh     Status: None   Collection Time: 10/31/14  6:08 PM  Result Value Ref Range   ABO/RH(D)  A POS   Glucose, capillary     Status: Abnormal   Collection Time: 10/31/14  9:15 PM  Result Value Ref Range   Glucose-Capillary 174 (H) 70 - 99 mg/dL   General alert and oriented Lung CTAB Car RRR  IMPRESSION: IUP at 25 w 4 days Cervical incompetence with cerclage in place Type 11 Diabetes  PLAN: Continue bedrest Complete steroid series Continue progesterone Magnesium for neuroprotection if necessary Continue checking Blood sugars

## 2014-11-02 LAB — GLUCOSE, CAPILLARY
GLUCOSE-CAPILLARY: 126 mg/dL — AB (ref 70–99)
Glucose-Capillary: 133 mg/dL — ABNORMAL HIGH (ref 70–99)
Glucose-Capillary: 125 mg/dL — ABNORMAL HIGH (ref 70–99)
Glucose-Capillary: 124 mg/dL — ABNORMAL HIGH (ref 70–99)

## 2014-11-02 NOTE — Progress Notes (Signed)
Inpatient Diabetes Program Recommendations  Diabetes Treatment Program Recommendations  ADA Standards of Care 2016 Diabetes in Pregnancy Target Glucose Ranges:  Fasting: 60 - 90 mg/dL Preprandial: 60 - 295105 mg/dL 1 hr postprandial: Less than 140mg /dL (from first bite of meal) 2 hr postprandial: Less than 120 mg/dL (from first bit of meal)    Noted some elevation in glucose levels following administration of betamethasone. High last HS but almost normalized this am. Pt on Metformin. Should/if pt need insulin in next weeks while hospitalized, please feel to call for assistance from our dept. Thank you, Lenor CoffinAnn Susano Cleckler, RN, CNS, Diabetes Coordinator  Pager [phone] 8:00 am to 5:00pm Office [phone])  8:6171m - 5:00 pm

## 2014-11-02 NOTE — Progress Notes (Signed)
3668w5d  S/   No new complaints, good FM  O// BP 124/83 mmHg  Pulse 110  Temp(Src) 98.2 F (36.8 C) (Oral)  Resp 18  Ht 5\' 5"  (1.651 m)  Wt 186 lb (84.369 kg)  BMI 30.95 kg/m2  FHR stable  A+P// 868w5d, vtx, short cx w/ cerclage, S/P BMZ, cont BR + P4

## 2014-11-03 LAB — GLUCOSE, CAPILLARY
GLUCOSE-CAPILLARY: 128 mg/dL — AB (ref 70–99)
Glucose-Capillary: 93 mg/dL (ref 70–99)
Glucose-Capillary: 102 mg/dL — ABNORMAL HIGH (ref 70–99)
Glucose-Capillary: 122 mg/dL — ABNORMAL HIGH (ref 70–99)

## 2014-11-03 NOTE — Progress Notes (Signed)
Patient ID: [name], female   DOB: 03/04/1971, 44 y.o.   MRN: [id number]   4210w6d  S// resting , no new complaints  O// BP 112/73 mmHg  Pulse 94  Temp(Src) 98.6 F (37 C) (Oral)  Resp 18  Ht 5\' 5"  (1.651 m)  Wt 186 lb (84.369 kg)  BMI 30.95 kg/m2  FHR stable  A+P// 3310w6d, vtx, cerclage, thin cervix, cont BR + P4, US this week

## 2014-11-04 LAB — GLUCOSE, CAPILLARY
GLUCOSE-CAPILLARY: 96 mg/dL (ref 70–99)
GLUCOSE-CAPILLARY: 159 mg/dL — AB (ref 70–99)
Glucose-Capillary: 79 mg/dL (ref 70–99)
Glucose-Capillary: 116 mg/dL — ABNORMAL HIGH (ref 70–99)

## 2014-11-04 NOTE — Progress Notes (Signed)
.  6453w0d  S// no new complaints O// BP 115/76 mmHg  Pulse 90  Temp(Src) 98.3 F (36.8 C) (Oral)  Resp 18  Ht 5\' 5"  (1.651 m)  Wt 186 lb (84.369 kg)  BMI 30.95 kg/m2  A+P//153w0d, vtx, short cx w/ cerclage, S/P BMZ>>ultrasound this week

## 2014-11-05 LAB — GLUCOSE, CAPILLARY
Glucose-Capillary: 108 mg/dL — ABNORMAL HIGH (ref 70–99)
Glucose-Capillary: 145 mg/dL — ABNORMAL HIGH (ref 70–99)
Glucose-Capillary: 86 mg/dL (ref 70–99)
Glucose-Capillary: 108 mg/dL — ABNORMAL HIGH (ref 70–99)

## 2014-11-05 MED ORDER — INSULIN NPH (HUMAN) (ISOPHANE) 100 UNIT/ML ~~LOC~~ SUSP
10.0000 [IU] | Freq: Every day | SUBCUTANEOUS | Status: DC
  Administered 2014-11-05 – 2015-01-03 (×60): 10 [IU] via SUBCUTANEOUS
  Filled 2014-11-05 (×2): qty 10

## 2014-11-05 NOTE — Progress Notes (Signed)
Ur chart review completed.  

## 2014-11-05 NOTE — Progress Notes (Signed)
Patient ID: [name], female   DOB: 12/31/1970, 44 y.o.   MRN: [id number] Pt without complaints GFM No ctxs or pressure  VSSAF FHR 140s + Accels Ctxs none  Abd Gravid nt  44 yo G1P0 @ 26 1/7 weeks Incompetent cervix-cerclage-now with cx dilation  Per MFM-will monitor cervix based on symptoms and exam  Vtx  BMZ completed  Continue vaginal progesterone  Type II DM  Continue Metformin  Monitor glucose, slightly elevated after BMZ  Magnesium Sulfate for neuroprotection if labors U/S for growth due in 2 weeks Monitor in patient for at least 1 week

## 2014-11-05 NOTE — Progress Notes (Addendum)
Inpatient Diabetes Program Recommendations  Diabetes Treatment Program Recommendations ADA Standards of Care 2016 Diabetes in Pregnancy Target Glucose Ranges:  Fasting: 60 - 90 mg/dL Preprandial: 60 - 161105 mg/dL 1 hr postprandial: Less than 140mg /dL (from first bite of meal) 2 hr postprandial: Less than 120 mg/dL (from first bit of meal)   Reviewing glucose patterns for the past 24 hrs. Last HS (2 hr pp supper) was elevated to 150 mg/dL.  This am fasting glucose elevated to 108 mg/dL and 2 hr PP lunch elevated to 145 mg/dL. Would recommend starting with attention to the fasting glucose first.(If 2 hr pp's continue elevated following a normal fasting glucose, will then address the am therapy)  Recommend starting with NPH at 10 units at HS.(Preferable over the glyburide which is taxing to her pancrease as well as the fetus') Spoke with RN-she will call Dr Rana SnareLowe and make aware of recommendations Would also advise that patient does eat an HS snack..  Thank you Lenor CoffinAnn Akhila Mahnken, RN, MSN, CDE  Diabetes Inpatient Program Office: [phone] Pager: [phone] 8:00 am to 5:00 pm

## 2014-11-06 LAB — GLUCOSE, CAPILLARY
GLUCOSE-CAPILLARY: 103 mg/dL — AB (ref 70–99)
GLUCOSE-CAPILLARY: 134 mg/dL — AB (ref 70–99)
Glucose-Capillary: 97 mg/dL (ref 70–99)
Glucose-Capillary: 109 mg/dL — ABNORMAL HIGH (ref 70–99)

## 2014-11-06 NOTE — Progress Notes (Signed)
Pt w/out complaints.  Good FM.    AF, VSS Abd - gravid, NT Ext - NT, no edema.  SCD  A/P:  26+2 weeks 1. Incompetent cervix - cerclage/progesterone.  S/p BMZ. vtx by last US Mag for neuroprotection/tocolysis if labors 2. Class B DM - Insulin added QHS - BG improved this am.  Metformin tid

## 2014-11-06 NOTE — Progress Notes (Signed)
Diabetes Treatment Program Recommendations  ADA Standards of Care 2016 Diabetes in Pregnancy Target Glucose Ranges:  Fasting: 60 - 90 mg/dL Preprandial: 60 - 161105 mg/dL 1 hr postprandial: Less than 140mg /dL (from first bite of meal) 2 hr postprandial: Less than 120 mg/dL (from first bit of meal)  Results for [name], [place] (MRN [id number]) as of 11/06/2014 09:09  Ref. Range 11/04/2014 22:04 11/05/2014 07:58 11/05/2014 10:58 11/05/2014 14:55 11/05/2014 20:16 11/06/2014 08:20  Glucose-Capillary Latest Range: 70-99 mg/dL 811159 (H) HS 914108 (H) Fasting 145 (H) 2 hr pp BF 86 108 (H) 97   Noted NPH insulin started last HS with 10 units. Fasting this am improved at 97 mg/dl (from 782108 yesterday fasting am-(fasting goal is 60-90 mg/dl) (However, on 9/562/14 the HS glucose was 159 mg/dL (2 hr pp dinner)-could have influence on fasting glucose the following am) Only one elevated glucose other than fasting yesterday was the 2 hr pp breakfast of 145 mg/dL Will follow today and based on meal 2 hr pp, can assess need for am NPH.  Thank you Lenor CoffinAnn Otto Caraway, RN, MSN, CDE  Diabetes Inpatient Program Office: [phone] Pager: [phone] 8:00 am to 5:00 pm

## 2014-11-07 LAB — GLUCOSE, CAPILLARY
GLUCOSE-CAPILLARY: 87 mg/dL (ref 70–99)
Glucose-Capillary: 94 mg/dL (ref 70–99)
Glucose-Capillary: 109 mg/dL — ABNORMAL HIGH (ref 70–99)
Glucose-Capillary: 88 mg/dL (ref 70–99)

## 2014-11-07 NOTE — Progress Notes (Signed)
No complaints.  +FM.  No vaginal pressure or change in d/c  VSS. AF. Abd: soft, NT Ext: no c/c/e  43yo G1 at 1675w3d with cervical incomp/cerclage, Class B DM -Continue vaginal prog -Bedrest -Vaginal/spec exam for change in symptoms; no more CL recommended per MFM -DM-metformin tid, insulin HS; improved glycemic control.    Mitchel HonourMegan Miguel Medal, DO

## 2014-11-08 LAB — GLUCOSE, CAPILLARY
Glucose-Capillary: 88 mg/dL (ref 70–99)
Glucose-Capillary: 107 mg/dL — ABNORMAL HIGH (ref 70–99)
Glucose-Capillary: 111 mg/dL — ABNORMAL HIGH (ref 70–99)
Glucose-Capillary: 108 mg/dL — ABNORMAL HIGH (ref 70–99)

## 2014-11-08 NOTE — Progress Notes (Signed)
Patient ID: [name], female   DOB: 11/05/1970, 44 y.o.   MRN: [id number] S: NO CTX'S NOTED.  DENIES ANY PRESSURE O: AF VSS      GRAVID UTERUS NONTENDER      FHR CAT ONE NO CTX      BLOOD SUGARS WITH GOOD CONTROL ON METFORMIN AND HS INSULIN A:IUP AT 26.4    INCOMPETENT CERVIX WITH CERCLAGE BUT WITH PROGRESSING CERVICAL THINNING    DIABETES P:CONTINUE REST AND PROGESTERONE

## 2014-11-09 LAB — GLUCOSE, CAPILLARY
GLUCOSE-CAPILLARY: 102 mg/dL — AB (ref 70–99)
Glucose-Capillary: 83 mg/dL (ref 70–99)
Glucose-Capillary: 90 mg/dL (ref 70–99)
Glucose-Capillary: 121 mg/dL — ABNORMAL HIGH (ref 70–99)

## 2014-11-09 NOTE — Progress Notes (Signed)
26 5/7 weeks  No C/O, no leaking, no bleeding  VSS Afeb Uterus soft, NT FHT reactive  Glucose good control  A/P: 26 5/7 weeks         Incompetent Cx with Cerclage and thinning         Diabetes         Continue BR, progesterone, surveillance of glucose         U/S next week

## 2014-11-10 LAB — GLUCOSE, CAPILLARY
GLUCOSE-CAPILLARY: 96 mg/dL (ref 70–99)
Glucose-Capillary: 89 mg/dL (ref 70–99)
Glucose-Capillary: 109 mg/dL — ABNORMAL HIGH (ref 70–99)
Glucose-Capillary: 116 mg/dL — ABNORMAL HIGH (ref 70–99)

## 2014-11-10 NOTE — Progress Notes (Signed)
26 6/7 wks  No C/O, No leaking/bleeding  VSS Afeb Uterus soft, NT  FHT + accels  Good Glucose control  A/P: Incompetent Cx with Cerclage and thinning           Progesterone, BR         Diabetes-good control on Metformin/insulin         U/S Next week

## 2014-11-10 NOTE — Progress Notes (Signed)
Patient has had company this afternoon and request to be monitored later.

## 2014-11-11 LAB — GLUCOSE, CAPILLARY
GLUCOSE-CAPILLARY: 88 mg/dL (ref 70–99)
GLUCOSE-CAPILLARY: 88 mg/dL (ref 70–99)
Glucose-Capillary: 111 mg/dL — ABNORMAL HIGH (ref 70–99)
Glucose-Capillary: 111 mg/dL — ABNORMAL HIGH (ref 70–99)

## 2014-11-11 NOTE — Progress Notes (Signed)
27 0/7 weeks  No C/O  VSS Afeb Uterus soft/NT  FHT + aceels  Glucose- good control  A/P: Incompetent Cx with Cerclage             Cx Thinning             Continue BR/Progesterone        Diabetes continue metformin        U/S  11/13/14

## 2014-11-12 LAB — GLUCOSE, CAPILLARY
GLUCOSE-CAPILLARY: 110 mg/dL — AB (ref 70–99)
GLUCOSE-CAPILLARY: 104 mg/dL — AB (ref 70–99)
GLUCOSE-CAPILLARY: 111 mg/dL — AB (ref 70–99)
Glucose-Capillary: 72 mg/dL (ref 70–99)

## 2014-11-12 NOTE — Progress Notes (Signed)
4425w1d  S// no new c/o  O//BP 115/74 mmHg  Pulse 104  Temp(Src) 98.6 F (37 C) (Oral)  Resp 18  Ht 5\' 5"  (1.651 m)  Wt 179 lb 2.2 oz (81.256 kg)  BMI 29.81 kg/m2  Stable  FHR, for US   A+P// 7625w1d, vtx, cerclage, CBG stable on Metformin/insulin

## 2014-11-13 ENCOUNTER — Inpatient Hospital Stay (HOSPITAL_COMMUNITY): Payer: BLUE CROSS/BLUE SHIELD

## 2014-11-13 DIAGNOSIS — O24312 Unspecified pre-existing diabetes mellitus in pregnancy, second trimester: Secondary | ICD-10-CM | POA: Diagnosis present

## 2014-11-13 DIAGNOSIS — Z3A27 27 weeks gestation of pregnancy: Secondary | ICD-10-CM | POA: Diagnosis not present

## 2014-11-13 DIAGNOSIS — O3430 Maternal care for cervical incompetence, unspecified trimester: Secondary | ICD-10-CM | POA: Diagnosis present

## 2014-11-13 DIAGNOSIS — O09519 Supervision of elderly primigravida, unspecified trimester: Secondary | ICD-10-CM | POA: Diagnosis present

## 2014-11-13 LAB — GLUCOSE, CAPILLARY
GLUCOSE-CAPILLARY: 80 mg/dL (ref 70–99)
GLUCOSE-CAPILLARY: 109 mg/dL — AB (ref 70–99)
GLUCOSE-CAPILLARY: 91 mg/dL (ref 70–99)
Glucose-Capillary: 121 mg/dL — ABNORMAL HIGH (ref 70–99)

## 2014-11-13 NOTE — Progress Notes (Signed)
No complaints. +FM. No vaginal pressure or change in d/c  VSS. AF. Abd: soft, NT Ext: no c/c/e  43yo G1 at 7662w2d with cervical incomp/cerclage, Class B DM -Continue vaginal prog -Bedrest -Vaginal/spec exam for change in symptoms -DM-metformin tid, insulin HS; excellent control -MFM U/S today for growth  Mitchel HonourMegan Azoria Abbett, DO

## 2014-11-14 LAB — GLUCOSE, CAPILLARY
GLUCOSE-CAPILLARY: 138 mg/dL — AB (ref 70–99)
Glucose-Capillary: 99 mg/dL (ref 70–99)
Glucose-Capillary: 138 mg/dL — ABNORMAL HIGH (ref 70–99)

## 2014-11-14 NOTE — Progress Notes (Signed)
S:  Patient without complaints. Reports good fetal movement.  O:  BP 118/70 mmHg  Pulse 104  Temp(Src) 97.8 F (36.6 C) (Oral)  Resp 18  Ht 5\' 5"  (1.651 m)  Wt 81.256 kg (179 lb 2.2 oz)  BMI 29.81 kg/m2 Results for orders placed or performed during the hospital encounter of 10/31/14 (from the past 24 hour(s))  Glucose, capillary     Status: Abnormal   Collection Time: 11/13/14 11:06 AM  Result Value Ref Range   Glucose-Capillary 109 (H) 70 - 99 mg/dL   Comment 1 Notify RN    Comment 2 Document in Chart   Glucose, capillary     Status: None   Collection Time: 11/13/14  3:25 PM  Result Value Ref Range   Glucose-Capillary 91 70 - 99 mg/dL   Comment 1 Notify RN    Comment 2 Document in Chart   Glucose, capillary     Status: Abnormal   Collection Time: 11/13/14  9:12 PM  Result Value Ref Range   Glucose-Capillary 121 (H) 70 - 99 mg/dL   General alert and oriented Lung CTAB Car RRR Abdomen is soft and non tender  Ultrasound yesterday EFW at 53%  IMPRESSION: IUP at 27 w 3  Cervical incompetence DM  PLAN: Continue bedrest and progesterone Cerclage in place Continue insulin and metformin

## 2014-11-15 LAB — GLUCOSE, CAPILLARY
GLUCOSE-CAPILLARY: 115 mg/dL — AB (ref 70–99)
GLUCOSE-CAPILLARY: 80 mg/dL (ref 70–99)
GLUCOSE-CAPILLARY: 117 mg/dL — AB (ref 70–99)
Glucose-Capillary: 96 mg/dL (ref 70–99)
Glucose-Capillary: 102 mg/dL — ABNORMAL HIGH (ref 70–99)

## 2014-11-15 NOTE — Progress Notes (Signed)
Pt without complaints.  Good FM  AF, VSS Fastin BG 80 2hr PP:  108-138 Gen - NAD Abd - gravid, NT Ext - NT  US 11/13/14:  AGA (53%), vtx  A/P:  27+4 wks with cervical incompetence and GDM Bedrest Progesterone Metformin/insulin

## 2014-11-15 NOTE — Progress Notes (Addendum)
Inpatient Diabetes Program Recommendations  Diabetes Treatment Program Recommendations  ADA Standards of Care 2016 Diabetes in Pregnancy Target Glucose Ranges:  Fasting: 60 - 90 mg/dL Preprandial: 60 - 829105 mg/dL 1 hr postprandial: Less than 140mg /dL (from first bite of meal) 2 hr postprandial: Less than 120 mg/dL (from first bit of meal)   Glucose overall well controlled. Occasional slight post-prandial elevations, but overall good. Unsure as to when the post-prandial cbg's are checked-if 1 hr pp, they are controlled. But if they are 2 hr pp readings, they are a little elevated. If they are 2 hr pp, and continue to elevate greater than 120mg /dL, would increase NPH slightly to 12 units in the am. Will follow and assist as needed and requested. Thank you Gina CoffinAnn Timeka Goette, RN, MSN, CDE   Diabetes Inpatient Program Office: [phone] Pager: [phone]

## 2014-11-16 LAB — GLUCOSE, CAPILLARY
Glucose-Capillary: 99 mg/dL (ref 70–99)
Glucose-Capillary: 138 mg/dL — ABNORMAL HIGH (ref 70–99)
Glucose-Capillary: 105 mg/dL — ABNORMAL HIGH (ref 70–99)
Glucose-Capillary: 113 mg/dL — ABNORMAL HIGH (ref 70–99)

## 2014-11-16 NOTE — Progress Notes (Signed)
Patient ID: [name], female   DOB: 10/23/1970, 44 y.o.   MRN: [id number] S: DOING WELL NO CTX'S OR LEAKAGE O: AF VSS      GRAVID UTERUS NONTENDER      FHR CAT ONE LAST PM NO CTX'S      GOOD BLOOD SUGAR CONTROL A:  IUP AT 27.5 INCOMPETENT CERVIX WITH CERCLAGE      DIABETES ON METFORMIN AND INSULIN AT HS P:  REST AND VAGINAL PROGESTERONE

## 2014-11-17 LAB — GLUCOSE, CAPILLARY
GLUCOSE-CAPILLARY: 88 mg/dL (ref 70–99)
GLUCOSE-CAPILLARY: 108 mg/dL — AB (ref 70–99)
GLUCOSE-CAPILLARY: 131 mg/dL — AB (ref 70–99)
Glucose-Capillary: 101 mg/dL — ABNORMAL HIGH (ref 70–99)

## 2014-11-17 NOTE — Progress Notes (Signed)
Patient ID: [name], female   DOB: 05/10/1971, 44 y.o.   MRN: [id number] S: NO LEAKAGE OR CONTRACTIONS NOTED O: AF VSS      FHR CAT ONE      GOOD BLOOD SUGAR CONTROL A:  IUP AT 27.6 WITH INCOMPETENT CERVIX      DIABETES ON METFORMIN AND INSULIN  P: CONTINUE REST AND PROGESTERONE

## 2014-11-18 LAB — GLUCOSE, CAPILLARY
GLUCOSE-CAPILLARY: 102 mg/dL — AB (ref 70–99)
Glucose-Capillary: 79 mg/dL (ref 70–99)
Glucose-Capillary: 94 mg/dL (ref 70–99)
Glucose-Capillary: 114 mg/dL — ABNORMAL HIGH (ref 70–99)

## 2014-11-18 NOTE — Progress Notes (Signed)
Patient ID: Gina HandyWendy Giovanetti, female   DOB: 08/16/1971, 44 y.o.   MRN: [id number] S: NO CTX'S NO LEAKAGE OR BLEEDING O: AF VSS      FHR LAST PM CAT ONE NO CTX'S NOTED      GOOD BLOOD SUGAR CONTROLL A:  IUP AT 28 WEEKS WITH INCOMPETENT CERVIX RESCUE CERCLAGE IN PLACE      DIABETES ON METFORMIN AND INSULIN P:  REST AND PROGESTERONE

## 2014-11-19 LAB — GLUCOSE, CAPILLARY
GLUCOSE-CAPILLARY: 97 mg/dL (ref 70–99)
Glucose-Capillary: 88 mg/dL (ref 70–99)
Glucose-Capillary: 124 mg/dL — ABNORMAL HIGH (ref 70–99)
Glucose-Capillary: 92 mg/dL (ref 70–99)

## 2014-11-19 NOTE — Progress Notes (Signed)
28 1/7 wks  No C/O, no leaking, no bleeding  VSS Afeb Uterus soft, NT FHT + accels Ext NT  A/P : Incompetent Cx with cerclage          DM with good control on metformin/insulin          Will order PT consult-D/W patient

## 2014-11-19 NOTE — Evaluation (Signed)
Physical Therapy Evaluation Patient Details Name: [name] MRN: [id number] DOB: [birth year] Today's Date: 11/19/2014   History of Present Illness  Patient admitted with incompetent cervix.  Cerclage in place.  Currently at 28 1/[redacted] weeks gestation  Clinical Impression  Patient demonstrated and verbalized understanding of all exercises and positioning education.  Patient reports no back pain at this time.  Did demonstrate back stretch for patient and father.  No further PT needs identified.  Will sign off.    Follow Up Recommendations No PT follow up    Equipment Recommendations  None recommended by PT    Recommendations for Other Services       Precautions / Restrictions Precautions Precaution Comments: bedrest with BRP      Mobility  Bed Mobility                  Transfers                    Ambulation/Gait                Stairs            Wheelchair Mobility    Modified Rankin (Stroke Patients Only)       Balance                                             Pertinent Vitals/Pain Pain Assessment: No/denies pain    Home Living Family/patient expects to be discharged to:: Private residence Living Arrangements: Spouse/significant other                    Prior Function                 Hand Dominance        Extremity/Trunk Assessment   Upper Extremity Assessment: Generalized weakness           Lower Extremity Assessment: Generalized weakness         Communication      Cognition Arousal/Alertness: Awake/alert Behavior During Therapy: WFL for tasks assessed/performed Overall Cognitive Status: Within Functional Limits for tasks assessed                      General Comments      Exercises Antenatal Exercises Ankle Circles/Pumps: AROM;Both;5 reps;Supine Quad Sets: AROM;Both;5 reps;Supine Short Arc Quad: AROM;Both;5 reps;Supine Hip ABduction/ADduction:  AROM;Supine;Both;5 reps Sidelying Hip Flexor Stretch: PROM;Right Other Exercises Other Exercises: educated patient on bed positioning and how to use pillows to support back and LE's.        Assessment/Plan    PT Assessment Patent does not need any further PT services  PT Diagnosis Generalized weakness   PT Problem List    PT Treatment Interventions     PT Goals (Current goals can be found in the Care Plan section) Acute Rehab PT Goals PT Goal Formulation: All assessment and education complete, DC therapy    Frequency     Barriers to discharge        Co-evaluation               End of Session   Activity Tolerance: Patient tolerated treatment well Patient left: in bed;with call bell/phone within reach;with family/visitor present           Time: 4098-1191 PT Time Calculation (min) (ACUTE ONLY): 24 min  Charges:   PT Evaluation $Initial PT Evaluation Tier I: 1 Procedure     PT G CodesOlivia Canter:        Roshini Fulwider M 11/19/2014, 7:41 PM 11/19/2014 Corlis HoveMargie Yeraldi Fidler, PT [phone]

## 2014-11-20 LAB — GLUCOSE, CAPILLARY
GLUCOSE-CAPILLARY: 114 mg/dL — AB (ref 70–99)
Glucose-Capillary: 102 mg/dL — ABNORMAL HIGH (ref 70–99)
Glucose-Capillary: 87 mg/dL (ref 70–99)
Glucose-Capillary: 111 mg/dL — ABNORMAL HIGH (ref 70–99)

## 2014-11-20 MED ORDER — SALINE SPRAY 0.65 % NA SOLN
1.0000 | NASAL | Status: DC | PRN
  Administered 2014-11-20: 1 via NASAL
  Filled 2014-11-20: qty 44

## 2014-11-20 NOTE — Progress Notes (Signed)
No complaints. +FM. No vaginal pressure or change in d/c  VSS. AF. Abd: soft, NT Ext: no c/c/e  43yo G1 at 4132w2d with cervical incomp/cerclage, Class B DM -Continue vaginal prog -Bedrest -Vaginal/spec exam for change in symptoms -DM-metformin tid, insulin HS; excellent control -VTX by last u/s 2/23  Mitchel HonourMegan Lamonica Trueba, DO

## 2014-11-20 NOTE — Plan of Care (Signed)
Problem: Consults Goal: Physical Therapy Consult Outcome: Completed/Met Date Met:  11/20/14 PT consult done on 11/19/14

## 2014-11-20 NOTE — Plan of Care (Signed)
Problem: Consults Goal: Teacher, musicVolunteer Services (Diversional Activities, Age Appropriate) Outcome: Progressing Patient notified of volunteer services that are available for antenatal patients.

## 2014-11-20 NOTE — Plan of Care (Signed)
Problem: Consults Goal: Birthing Suites Patient Information Press F2 to bring up selections list  Outcome: Not Applicable Date Met:  11/20/14 Patient on antenatal     

## 2014-11-20 NOTE — Progress Notes (Signed)
Inpatient Diabetes Program Recommendations  Diabetes Treatment Program Recommendations  ADA Standards of Care 2016 Diabetes in Pregnancy Target Glucose Ranges:  Fasting: 60 - 90 mg/dL Preprandial: 60 - 536105 mg/dL 1 hr postprandial: Less than 140mg /dL (from first bite of meal) 2 hr postprandial: Less than 120 mg/dL (from first bit of meal)   Glucose control has been excellent thus far. Noted slight elevation(s) yesterday-124 mg/dL post-prandial lunch, but normalized to 92 without intervention at HS (PP supper). Fasting this am at 102 mg/dL (goal is 64-4060-90 mg/dL fasting), however all other cbg's are within target goal range. Pt at 28.2 weeks, typically when hormones are increasing as well as glucose levels. For future control in the next few days, may need to increase NPH at HS to 12 units (titrate slightly every few days until fastings are at goal).  If fastings are within target goal (60-90), but post-prandial glucose following lunch and/or supper, may want to add an AM dose of NPH (peaks mid-afternoon) of low dose-could start at 5units NPH in the am Will follow patient's glucose pattern and glad to assess, assist and recommend as needed and/or requested.  Thank you Gina CoffinAnn Jaise Moser, RN, MSN, CDE  Diabetes Inpatient Program Office: [phone] Pager: [phone] 8:00 am to 5:00 pm

## 2014-11-21 LAB — GLUCOSE, CAPILLARY
GLUCOSE-CAPILLARY: 132 mg/dL — AB (ref 70–99)
Glucose-Capillary: 86 mg/dL (ref 70–99)
Glucose-Capillary: 111 mg/dL — ABNORMAL HIGH (ref 70–99)
Glucose-Capillary: 95 mg/dL (ref 70–99)

## 2014-11-21 NOTE — Progress Notes (Signed)
No complaints. +FM. No vaginal pressure or change in d/c  VSS. AF. Abd: soft, NT Ext: no c/c/e  43yo G1 at 5491w3d with cervical incomp/cerclage, Class B DM -Continue vaginal prog -Bedrest -Vaginal/spec exam for change in symptoms -DM-metformin tid, insulin HS; fasting elevated yesterday but o/w excellent control - will watch -VTX by last u/s 2/23

## 2014-11-22 LAB — GLUCOSE, CAPILLARY
GLUCOSE-CAPILLARY: 127 mg/dL — AB (ref 70–99)
Glucose-Capillary: 91 mg/dL (ref 70–99)
Glucose-Capillary: 141 mg/dL — ABNORMAL HIGH (ref 70–99)
Glucose-Capillary: 99 mg/dL (ref 70–99)

## 2014-11-22 NOTE — Progress Notes (Signed)
Normal fetal movement. No complaints.  BP 126/79 mmHg  Pulse 94  Temp(Src) 98.1 F (36.7 C) (Oral)  Resp 20  Ht 5\' 5"  (1.651 m)  Wt 81.92 kg (180 lb 9.6 oz)  BMI 30.05 kg/m2 Results for orders placed or performed during the hospital encounter of 10/31/14 (from the past 24 hour(s))  Glucose, capillary     Status: None   Collection Time: 11/21/14  8:21 AM  Result Value Ref Range   Glucose-Capillary 86 70 - 99 mg/dL   Comment 1 Notify RN    Comment 2 Document in Chart   Glucose, capillary     Status: Abnormal   Collection Time: 11/21/14 10:56 AM  Result Value Ref Range   Glucose-Capillary 132 (H) 70 - 99 mg/dL   Comment 1 Notify RN    Comment 2 Document in Chart   Glucose, capillary     Status: Abnormal   Collection Time: 11/21/14  3:29 PM  Result Value Ref Range   Glucose-Capillary 111 (H) 70 - 99 mg/dL  Glucose, capillary     Status: None   Collection Time: 11/21/14  9:35 PM  Result Value Ref Range   Glucose-Capillary 95 70 - 99 mg/dL  Glucose, capillary     Status: None   Collection Time: 11/22/14  8:01 AM  Result Value Ref Range   Glucose-Capillary 91 70 - 99 mg/dL   Comment 1 Notify RN    Comment 2 Document in Chart    General alert and oriented Lung CTAB Car RRR Abdomen is soft and non tender  IMPRESSION: IUP at 28 w 4 days Cervical Incompetence Diabetes Mellitus  PLAN: Continue progesterone  Cerclage in place Continue metformin and insulin

## 2014-11-23 LAB — GLUCOSE, CAPILLARY
GLUCOSE-CAPILLARY: 105 mg/dL — AB (ref 70–99)
GLUCOSE-CAPILLARY: 112 mg/dL — AB (ref 70–99)
Glucose-Capillary: 82 mg/dL (ref 70–99)
Glucose-Capillary: 94 mg/dL (ref 70–99)

## 2014-11-23 NOTE — Progress Notes (Signed)
Patient ID: [name], female   DOB: 07/21/1971, 44 y.o.   MRN: [id number] Pt in shower.  No change per RN  VSSAF FHR 140s Ctxs rare  43yo G1 at 3357w5d with cervical incomp/cerclage, Class B DM -Continue vaginal prog -Bedrest -Vaginal/spec exam for change in symptoms -DM-metformin tid, insulin HS; excellent control -VTX by last u/s 2/23

## 2014-11-24 LAB — GLUCOSE, CAPILLARY
GLUCOSE-CAPILLARY: 105 mg/dL — AB (ref 70–99)
GLUCOSE-CAPILLARY: 83 mg/dL (ref 70–99)
Glucose-Capillary: 113 mg/dL — ABNORMAL HIGH (ref 70–99)
Glucose-Capillary: 101 mg/dL — ABNORMAL HIGH (ref 70–99)

## 2014-11-24 NOTE — Progress Notes (Signed)
Patient ID: [name], female   DOB: 01/05/1971, 44 y.o.   MRN: [id number] Pt without complaints GFM  VSSAF FHR 140s Ctxs rare  Abd Gravid nt  43yo G1 at 5674w6d with cervical incomp/cerclage, Class B DM -Continue vaginal prog -Bedrest -Vaginal/spec exam for change in symptoms -DM-metformin tid, insulin HS; excellent control -VTX by last u/s 2/23  MFM consult next week to discuss possible outpt management

## 2014-11-25 LAB — GLUCOSE, CAPILLARY
GLUCOSE-CAPILLARY: 92 mg/dL (ref 70–99)
GLUCOSE-CAPILLARY: 103 mg/dL — AB (ref 70–99)
Glucose-Capillary: 78 mg/dL (ref 70–99)
Glucose-Capillary: 102 mg/dL — ABNORMAL HIGH (ref 70–99)

## 2014-11-25 NOTE — Progress Notes (Signed)
2050 Patients 2 hour post meal Blood Sugar 102

## 2014-11-25 NOTE — Progress Notes (Signed)
Patient ID: [name], female   DOB: 11/25/1970, 44 y.o.   MRN: [id number]  Patient without complaints GFM  VSSAF  FHR 140s + accels Rare ctxs  Abd Gravid , Nt Neg homans bil  43yo G1 at 7271w0d with cervical incomp/cerclage, Class B DM -Continue vaginal prog -Bedrest -Vaginal/spec exam for change in symptoms -DM-metformin tid, insulin HS; excellent control -VTX by last u/s 2/23  MFM consult next week to discuss possible outpt management

## 2014-11-26 LAB — GLUCOSE, CAPILLARY
Glucose-Capillary: 81 mg/dL (ref 70–99)
Glucose-Capillary: 124 mg/dL — ABNORMAL HIGH (ref 70–99)
Glucose-Capillary: 130 mg/dL — ABNORMAL HIGH (ref 70–99)
Glucose-Capillary: 124 mg/dL — ABNORMAL HIGH (ref 70–99)

## 2014-11-26 NOTE — Progress Notes (Signed)
No complaints. +FM. No vaginal pressure or change in d/c  VSS. AF. Abd: soft, NT Ext: no c/c/e FHT Cat I, flat toco  43yo G1 at 1156w1d with cervical incomp/cerclage, Class B DM -Continue vaginal prog -Bedrest -Vaginal/spec exam for change in symptoms -DM-metformin tid, insulin HS; excellent control -VTX by last u/s 2/23 -MFM consult today to discuss possibility of outpt management   Mitchel HonourMegan Hennesy Sobalvarro, DO

## 2014-11-27 ENCOUNTER — Inpatient Hospital Stay (HOSPITAL_COMMUNITY): Payer: BLUE CROSS/BLUE SHIELD

## 2014-11-27 ENCOUNTER — Encounter (HOSPITAL_COMMUNITY): Payer: Self-pay | Admitting: Obstetrics

## 2014-11-27 DIAGNOSIS — Z3A29 29 weeks gestation of pregnancy: Secondary | ICD-10-CM

## 2014-11-27 LAB — GLUCOSE, CAPILLARY
GLUCOSE-CAPILLARY: 106 mg/dL — AB (ref 70–99)
Glucose-Capillary: 77 mg/dL (ref 70–99)
Glucose-Capillary: 95 mg/dL (ref 70–99)
Glucose-Capillary: 96 mg/dL (ref 70–99)

## 2014-11-27 MED ORDER — MAGNESIUM HYDROXIDE 400 MG/5ML PO SUSP
15.0000 mL | Freq: Every day | ORAL | Status: DC | PRN
  Administered 2014-11-28: 15 mL via ORAL
  Filled 2014-11-27: qty 30

## 2014-11-27 NOTE — Progress Notes (Signed)
29 2/7 weeks No C/O No leaking/bleeding  VSS Afeb Uterus soft, NT  FHT Cat One  A/P Cervical Incompetence/Cerclage/Thin Cx        Class B DM-good control on metformin/insulin         BR        Vtx on last U/S        MFM to consult today

## 2014-11-27 NOTE — Progress Notes (Signed)
I assisted Dr Molly MaduroLinda Street with ultrasound, I ordered patients dinner, snack, and dinner, by Orlan LeavensViria Alvarez Spanish Interpreter

## 2014-11-27 NOTE — Progress Notes (Addendum)
MATERNAL FETAL MEDICINE CONSULT  Patient Name: [name] Medical Record Number:  [id number] Date of Birth: [birth year] Requesting Physician Name:  Harold Hedge, MD Date of Service: 11/27/2014  Chief Complaint Cervical incompetence  History of Present Illness [name] was seen today  secondary to cervical incompetence at the request of Dr. Henderson Cloud .  The patient is a 44 y.o. G1P0,at [redacted]w[redacted]d with an EDD of 02/10/2015, by Ultrasound dating method. She was admitted at [redacted]w[redacted]d due to no measurable cervix on ultrasound and a speculum exam consistent with 1-2cm dilation.  She had a rescue cerclage placed prior to that point without complications.  She reports no change in symptoms since being admitted.  No LOF, VB, CTX, +FM.  [name] prenatal course has also been complicated by Type 2 diabetes that was first diagnosed about 4 years ago. Her baseline HbA1C was 7. She reports that her fingerstick values have been well controlled on Metformin 500 mg TID but she did require addition of 12 units NPH qhs a few days ago.   Her prenatal course has otherwise been uncomplicated.  Review of Systems Pertinent items are noted in HPI.  Patient History OB History  Gravida Para Term Preterm AB SAB TAB Ectopic Multiple Living  1 0        0    # Outcome Date GA Lbr Len/2nd Weight Sex Delivery Anes PTL Lv  1 Current               Past Medical History  Diagnosis Date  . Diabetes mellitus without complication   . Infection     bladder infection  . Ovarian cyst   . Vaginal Pap smear, abnormal     laser removal of abn cells; normal since  . Varicose veins during pregnancy, antepartum     Past Surgical History  Procedure Laterality Date  . Ovarian cyst removal    . Oophorectomy    . Cervical cerclage N/A 10/12/2014    Procedure: CERCLAGE CERVICAL;  Surgeon: Turner Daniels, MD;  Location: [facility];  Service: Gynecology;  Laterality: N/A;  . No past surgeries      History   Social History  .  Marital Status: Married    Spouse Name: N/A  . Number of Children: N/A  . Years of Education: N/A   Social History Main Topics  . Smoking status: Never Smoker   . Smokeless tobacco: Never Used  . Alcohol Use: Yes     Comment: rare, none with pregnancy  . Drug Use: No  . Sexual Activity: Not Currently    Birth Control/ Protection: Pill     Comment: on pill when found out pregnant   Other Topics Concern  . None   Social History Narrative    Family History  Problem Relation Age of Onset  . Hypertension Mother   . Diabetes Mother   . Stroke Mother   . Heart disease Father   . Hypertension Father   . Stroke Maternal Grandmother   . Cancer Maternal Grandfather     lung  . Heart disease Other     nephew born with narrow aorta, stint placed, aortic valve only had 2 leaves   In addition, the patient has no family history of mental retardation, birth defects, or genetic diseases.  Physical Examination Filed Vitals:   11/27/14 1200  BP: 109/64  Pulse: 100  Temp: 98 F (36.7 C)  Resp: 18   General appearance - alert, well appearing, and in no  distress  Assessment and Recommendations 1) Single IUP at 4245w2d 2) Cervical insufficiency s/p rescue cerclage-  S/p betamethasone on 2/10-11.  Has been stable in house since.  We discussed that certainly outpatient management is reasonable at this gestational age with 1-2 cm stable cervix.  However [name] has concerns about her ability to replicate bed rest/hospital conditions at home and she lives 45 minutes away.  We discussed that in house management is also reasonable due to the distance she lives from the hospital.  She would need a cervical exam prior to discharge.  If she continues to remain stable and elects to remain in house after discussing risks/benefits of inpatient versus outpatient management we discussed discharge home by 32 weeks.  She may take wheelchair rides for fresh air. 3) type 2 DM- Currently on metformin 500  mg TID and NPH 12 units qhs with good control.  Continue serial growth evaluations and begin antenatal testing at 32 weeks.  4) Advanced maternal age-  Low risk NIPS. >45 minutes was spent with the patient with >50% of it dedicated to face to face counseling on the above.        Molly MaduroSTREET, Deyanira Fesler, MD

## 2014-11-28 LAB — GLUCOSE, CAPILLARY
GLUCOSE-CAPILLARY: 110 mg/dL — AB (ref 70–99)
GLUCOSE-CAPILLARY: 116 mg/dL — AB (ref 70–99)
Glucose-Capillary: 70 mg/dL (ref 70–99)
Glucose-Capillary: 83 mg/dL (ref 70–99)

## 2014-11-28 MED ORDER — PROGESTERONE MICRONIZED 200 MG PO CAPS: 200.0000 mg | ORAL_CAPSULE | Freq: Every day | ORAL | Status: DC

## 2014-11-28 MED ORDER — PROGESTERONE MICRONIZED 200 MG PO CAPS
200.0000 mg | ORAL_CAPSULE | Freq: Every day | ORAL | Status: DC
  Administered 2014-11-29 – 2015-01-03 (×36): 200 mg via VAGINAL
  Filled 2014-11-28 (×36): qty 1

## 2014-11-28 NOTE — Progress Notes (Signed)
Patient ID: [name], female   DOB: 04/12/1971, 44 y.o.   MRN: [id number] S: GOOD FM NO CTX'S OR LEAKAGE O: AF VSS      GRAVID UTERUS      FHR CAT ONE      GOOD BS CONTROLL A:  IUP AT 29.3 WEEKS      DIABETES ON METFORMIN AND INSULIN      INCOMPETENT CERVIX P: CONTINUE BEDREST

## 2014-11-29 LAB — GLUCOSE, CAPILLARY
GLUCOSE-CAPILLARY: 130 mg/dL — AB (ref 70–99)
Glucose-Capillary: 78 mg/dL (ref 70–99)
Glucose-Capillary: 114 mg/dL — ABNORMAL HIGH (ref 70–99)
Glucose-Capillary: 126 mg/dL — ABNORMAL HIGH (ref 70–99)

## 2014-11-29 NOTE — Progress Notes (Signed)
3544w4d   S// no further ctx or D/C  O// BP 122/82 mmHg  Pulse 95  Temp(Src) 98.6 F (37 C) (Oral)  Resp 18  Ht 5\' 5"  (1.651 m)  Wt 181 lb 11.2 oz (82.419 kg)  BMI 30.24 kg/m2  Good CBG control  A+P//5644w4d, vtx, cerclage, good CBg control on metformin +m hs insulin

## 2014-11-30 LAB — GLUCOSE, CAPILLARY
GLUCOSE-CAPILLARY: 80 mg/dL (ref 70–99)
GLUCOSE-CAPILLARY: 124 mg/dL — AB (ref 70–99)
Glucose-Capillary: 107 mg/dL — ABNORMAL HIGH (ref 70–99)

## 2014-11-30 NOTE — Progress Notes (Signed)
Patient without complaints  BP 114/81 mmHg  Pulse 103  Temp(Src) 98.3 F (36.8 C) (Oral)  Resp 18  Ht 5\' 5"  (1.651 m)  Wt 181 lb 11.2 oz (82.419 kg)  BMI 30.24 kg/m2 Results for orders placed or performed during the hospital encounter of 10/31/14 (from the past 24 hour(s))  Glucose, capillary     Status: Abnormal   Collection Time: 11/29/14 10:48 AM  Result Value Ref Range   Glucose-Capillary 130 (H) 70 - 99 mg/dL   Comment 1 Document in Chart    Comment 2 Repeat Test   Glucose, capillary     Status: Abnormal   Collection Time: 11/29/14  4:37 PM  Result Value Ref Range   Glucose-Capillary 114 (H) 70 - 99 mg/dL   Comment 1 Document in Chart    Comment 2 Repeat Test   Glucose, capillary     Status: Abnormal   Collection Time: 11/29/14  8:24 PM  Result Value Ref Range   Glucose-Capillary 126 (H) 70 - 99 mg/dL  Glucose, capillary     Status: None   Collection Time: 11/30/14  8:25 AM  Result Value Ref Range   Glucose-Capillary 80 70 - 99 mg/dL   General alert and oriented Lung CTAB CarRRR Abdomen is soft and non tender  IMPRESSION: IUP at 29 w 5 days Cervical incompetence Diabetes  PLAN: Stable Continue current treatment plan

## 2014-12-01 LAB — GLUCOSE, CAPILLARY
GLUCOSE-CAPILLARY: 105 mg/dL — AB (ref 70–99)
Glucose-Capillary: 70 mg/dL (ref 70–99)
Glucose-Capillary: 83 mg/dL (ref 70–99)
Glucose-Capillary: 93 mg/dL (ref 70–99)

## 2014-12-01 NOTE — Progress Notes (Signed)
Patient is doing well. Good fetal movement. No contractions  BP 113/73 mmHg  Pulse 99  Temp(Src) 98 F (36.7 C) (Oral)  Resp 20  Ht 5\' 5"  (1.651 m)  Wt 181 lb 11.2 oz (82.419 kg)  BMI 30.24 kg/m2 Results for orders placed or performed during the hospital encounter of 10/31/14 (from the past 24 hour(s))  Glucose, capillary     Status: Abnormal   Collection Time: 11/30/14  3:10 PM  Result Value Ref Range   Glucose-Capillary 124 (H) 70 - 99 mg/dL  Glucose, capillary     Status: Abnormal   Collection Time: 11/30/14 10:05 PM  Result Value Ref Range   Glucose-Capillary 107 (H) 70 - 99 mg/dL  Glucose, capillary     Status: None   Collection Time: 12/01/14  9:22 AM  Result Value Ref Range   Glucose-Capillary 70 70 - 99 mg/dL   General alert and oriented Lung CTAB Car RRR Abdomen is non tender  IMPRESSION: IUP at 29 w 6 days Cervical incompetence Diabetes  PLAN: Stable Good glucose control Continue current plan

## 2014-12-02 LAB — GLUCOSE, CAPILLARY
GLUCOSE-CAPILLARY: 107 mg/dL — AB (ref 70–99)
GLUCOSE-CAPILLARY: 98 mg/dL (ref 70–99)
Glucose-Capillary: 76 mg/dL (ref 70–99)
Glucose-Capillary: 101 mg/dL — ABNORMAL HIGH (ref 70–99)
Glucose-Capillary: 115 mg/dL — ABNORMAL HIGH (ref 70–99)

## 2014-12-02 NOTE — Progress Notes (Signed)
S:  Patient is doing well. No complaints. Reports good fetal movement.  O:  BP 109/57 mmHg  Pulse 100  Temp(Src) 98.4 F (36.9 C) (Oral)  Resp 18  Ht 5\' 5"  (1.651 m)  Wt 181 lb 11.2 oz (82.419 kg)  BMI 30.24 kg/m2 Results for orders placed or performed during the hospital encounter of 10/31/14 (from the past 24 hour(s))  Glucose, capillary     Status: None   Collection Time: 12/01/14 12:07 PM  Result Value Ref Range   Glucose-Capillary 83 70 - 99 mg/dL  Glucose, capillary     Status: None   Collection Time: 12/01/14  3:02 PM  Result Value Ref Range   Glucose-Capillary 93 70 - 99 mg/dL  Glucose, capillary     Status: Abnormal   Collection Time: 12/01/14  7:58 PM  Result Value Ref Range   Glucose-Capillary 105 (H) 70 - 99 mg/dL   General alert and oriented Lung CTAB Car RRR Abdomen non tender  IMPRESSION: IUP at 30 weeks Cervical incompetence Type 2 Diabetes Mellitus  PLAN: Continue in house management until 32 weeks and reevaluate at that time Cerclage in place Continue current medications Good glucose control

## 2014-12-03 LAB — GLUCOSE, CAPILLARY
GLUCOSE-CAPILLARY: 78 mg/dL (ref 70–99)
Glucose-Capillary: 101 mg/dL — ABNORMAL HIGH (ref 70–99)
Glucose-Capillary: 100 mg/dL — ABNORMAL HIGH (ref 70–99)
Glucose-Capillary: 108 mg/dL — ABNORMAL HIGH (ref 70–99)

## 2014-12-03 NOTE — Progress Notes (Signed)
Patient ID: [name], female   DOB: 03/28/1971, 44 y.o.   MRN: [id number] Pt without complaints GFM Rare Ctxs  VSSAF FHR 145 + accels Rare ctxs  Glc levels excellent  Abd Gravid nt Neg homans bil  43yo G1 at 30 1/7 with cervical incomp/cerclage Class B DM-metformin tid, insulin HS; excellent control -Continue vaginal prog -Bedrest -Vaginal/spec exam for change in symptoms  Continue in house management until 32 weeks then reassess US for growth at 32 weeks -VTX by last u/s 2/23

## 2014-12-04 LAB — GLUCOSE, CAPILLARY
Glucose-Capillary: 77 mg/dL (ref 70–99)
Glucose-Capillary: 116 mg/dL — ABNORMAL HIGH (ref 70–99)
Glucose-Capillary: 105 mg/dL — ABNORMAL HIGH (ref 70–99)
Glucose-Capillary: 116 mg/dL — ABNORMAL HIGH (ref 70–99)

## 2014-12-04 NOTE — Progress Notes (Signed)
Patient ID: [name], female   DOB: 12/03/1970, 44 y.o.   MRN: [id number] S: NO LEAKAGE OR BLEEDING  GOOD FETAL  MOVEMENT O: AF VSS      GRAVID UTERUS NONTENDER      FHR TRACING CAT ONE LAST PM      BLOOD GLUCOSES EXCELLENT A:  IUP AT 30.2 WITH INCOMPETENT CERVIX WITH CERCLAGE      CLASS B DIABETES ON METFORMIN AND INSULIN AT HS P:  CONTINUE PRESENT MANAGEMENT

## 2014-12-05 LAB — GLUCOSE, CAPILLARY
GLUCOSE-CAPILLARY: 143 mg/dL — AB (ref 70–99)
GLUCOSE-CAPILLARY: 102 mg/dL — AB (ref 70–99)
Glucose-Capillary: 83 mg/dL (ref 70–99)
Glucose-Capillary: 155 mg/dL — ABNORMAL HIGH (ref 70–99)
Glucose-Capillary: 89 mg/dL (ref 70–99)

## 2014-12-05 NOTE — Progress Notes (Signed)
RN relayed pt request for wheelchair ride rather than stretcher ride to MD. Per MD, pt may have wheelchair ride.

## 2014-12-05 NOTE — Progress Notes (Signed)
S/  No new c/o  O/  BP 99/71 mmHg  Pulse 92  Temp(Src) 98.1 F (36.7 C) (Oral)  Resp 18  Ht 5\' 5"  (1.651 m)  Wt 182 lb 14.4 oz (82.963 kg)  BMI 30.44 kg/m2  FHR stable  A+P// 7055w3d, cerclage, good CBG control, cont obsv

## 2014-12-05 NOTE — Discharge Summary (Signed)
NAMLiberty Handy:  [name], [name]              ACCOUNT NO.:  [id number]  MEDICAL RECORD NO.:  [id number]  LOCATION:  9161                          FACILITY:  [facility]  PHYSICIAN:  Gina [name], MD    DATE OF BIRTH:  1971/09/16  DATE OF ADMISSION:  10/11/2014 DATE OF DISCHARGE:  10/15/2014                              DISCHARGE SUMMARY   ADMISSION DIAGNOSIS:  Cervical incompetence.  DISCHARGE DIAGNOSIS:  Cervical incompetence.  PROCEDURE:  Emergent McDonald cervical cerclage.  HOSPITAL COURSE:  The patient was admitted to the hospital after presenting for routine OB appointment and noted to have no measurable cervix on ultrasound.  She was started on vaginal progesterone and MFM consultation was performed.  Rescue cerclage attempt was recommended and discussed with the patient, they agreed to move forward.  Procedure was performed without complications.  She was stable in the hospital for 24 hours and felt stable for discharge.  She was discharged home with vaginal progesterone and scheduled followup.     Gina CairoGretchen Keiaira Donlan, MD     GA/MEDQ  D:  12/04/2014  T:  12/05/2014  Job:  [id number]

## 2014-12-06 LAB — GLUCOSE, CAPILLARY
GLUCOSE-CAPILLARY: 79 mg/dL (ref 70–99)
Glucose-Capillary: 118 mg/dL — ABNORMAL HIGH (ref 70–99)
Glucose-Capillary: 118 mg/dL — ABNORMAL HIGH (ref 70–99)
Glucose-Capillary: 104 mg/dL — ABNORMAL HIGH (ref 70–99)

## 2014-12-06 MED ORDER — FLUCONAZOLE 150 MG PO TABS
150.0000 mg | ORAL_TABLET | Freq: Once | ORAL | Status: AC
  Administered 2014-12-06: 150 mg via ORAL
  Filled 2014-12-06: qty 1

## 2014-12-06 NOTE — Progress Notes (Signed)
30 4/7 weeks  No c/o No leaking/bleeding  VSS Afeb FHT cat one  Glucose-2 values in 140-150s yesterday  A/P: Incompetent Cx-Cerclage         DM-Metformin/insulin, watch CBG         Vtx on last U/S

## 2014-12-06 NOTE — Progress Notes (Signed)
Pt. c/o "yeast infection" - reports itching with white discharge.  Called Dr. Henderson Cloudomblin - order for Diflucan 150mg  PO x1 received.

## 2014-12-07 LAB — GLUCOSE, CAPILLARY
GLUCOSE-CAPILLARY: 80 mg/dL (ref 70–99)
GLUCOSE-CAPILLARY: 136 mg/dL — AB (ref 70–99)
GLUCOSE-CAPILLARY: 115 mg/dL — AB (ref 70–99)
Glucose-Capillary: 98 mg/dL (ref 70–99)

## 2014-12-07 NOTE — Progress Notes (Signed)
30 5/7 weeks  No c/o No leaking/bleeding  VSS Afeb FHT cat one  Glucose-good control  A/P: Incompetent Cx-Cerclage  DM-Metformin/insulin, watch CBG  Vtx on last U/S

## 2014-12-08 LAB — GLUCOSE, CAPILLARY
GLUCOSE-CAPILLARY: 107 mg/dL — AB (ref 70–99)
GLUCOSE-CAPILLARY: 112 mg/dL — AB (ref 70–99)
Glucose-Capillary: 74 mg/dL (ref 70–99)
Glucose-Capillary: 102 mg/dL — ABNORMAL HIGH (ref 70–99)

## 2014-12-08 NOTE — Progress Notes (Signed)
Pt denies feeling any ctxs.  Pt encouraged to void and keep bladder empty.

## 2014-12-08 NOTE — Plan of Care (Signed)
Problem: Phase II Progression Outcomes Goal: Electronic fetal monitoring(Doppler,Continuous,Intermittent) EFM (Doppler, Continuous, Intermittent)  Outcome: Progressing NST QS.

## 2014-12-08 NOTE — Progress Notes (Signed)
30 6/7 weeks  No c/o No leaking/bleeding  VSS Afeb FHT cat one  Glucose-good control  A/P: Incompetent Cx-Cerclage  DM-Metformin/insulin, watch CBG  Vtx on last U/S

## 2014-12-09 LAB — GLUCOSE, CAPILLARY
GLUCOSE-CAPILLARY: 70 mg/dL (ref 70–99)
GLUCOSE-CAPILLARY: 87 mg/dL (ref 70–99)
Glucose-Capillary: 156 mg/dL — ABNORMAL HIGH (ref 70–99)
Glucose-Capillary: 105 mg/dL — ABNORMAL HIGH (ref 70–99)

## 2014-12-09 NOTE — Progress Notes (Signed)
31 weeks  No c/o.  Not feeling ctx No leaking/bleeding  VSS Afeb FHT cat one Toco - mild ctx noted on NST yesterday  Glucose-good control  A/P: Incompetent Cx-Cerclage  DM-Metformin/insulin, watch CBG  Vtx on last U/S

## 2014-12-10 LAB — GLUCOSE, CAPILLARY
GLUCOSE-CAPILLARY: 73 mg/dL (ref 70–99)
GLUCOSE-CAPILLARY: 107 mg/dL — AB (ref 70–99)
Glucose-Capillary: 105 mg/dL — ABNORMAL HIGH (ref 70–99)
Glucose-Capillary: 103 mg/dL — ABNORMAL HIGH (ref 70–99)

## 2014-12-10 NOTE — Progress Notes (Signed)
31 1/7 weeks No C/O No leaking or bleeding  VSS Afeb Glu control stable FHT Cat One Uterus soft and NT  A/P: Incompetent Cx with cerclage        DM - insulin/metformin        U/S this week

## 2014-12-10 NOTE — Progress Notes (Signed)
RN spoke to [employer]act[employer], Vernona RiegerLaura, regarding lactation consult for pt. Lactation Consult reported she will be by at some point today to speak with pt.

## 2014-12-10 NOTE — Consult Note (Signed)
Lactation Note: Met with mom to provide breastfeeding materials and education.  We also discussed importance of pumping if baby is born preterm.  Answered questions and reassured mom that we will be available to assist her.

## 2014-12-11 ENCOUNTER — Inpatient Hospital Stay (HOSPITAL_COMMUNITY): Payer: BLUE CROSS/BLUE SHIELD

## 2014-12-11 DIAGNOSIS — Z3A31 31 weeks gestation of pregnancy: Secondary | ICD-10-CM | POA: Insufficient documentation

## 2014-12-11 DIAGNOSIS — O3430 Maternal care for cervical incompetence, unspecified trimester: Secondary | ICD-10-CM | POA: Insufficient documentation

## 2014-12-11 LAB — GLUCOSE, CAPILLARY
GLUCOSE-CAPILLARY: 108 mg/dL — AB (ref 70–99)
Glucose-Capillary: 71 mg/dL (ref 70–99)
Glucose-Capillary: 96 mg/dL (ref 70–99)
Glucose-Capillary: 82 mg/dL (ref 70–99)

## 2014-12-11 NOTE — Progress Notes (Signed)
Patient ID: [name], female   DOB: 02/10/1971, 44 y.o.   MRN: [id number] Pt without complaints GFM  VSSAF FHR Cat 1  Glc levels good control  Abd Gravid, nt Neg homans Bilaterally  43yo G1 at 7231 2/7 with cervical incomp/cerclage Class B DM-metformin tid, insulin HS; excellent control -Continue vaginal prog -Bedrest -Vaginal/spec exam for change in symptoms  Continue in house management until 32 weeks then reassess US for growth at this week -VTX by last u/s 2/23

## 2014-12-12 LAB — GLUCOSE, CAPILLARY
GLUCOSE-CAPILLARY: 71 mg/dL (ref 70–99)
GLUCOSE-CAPILLARY: 95 mg/dL (ref 70–99)
GLUCOSE-CAPILLARY: 107 mg/dL — AB (ref 70–99)
GLUCOSE-CAPILLARY: 78 mg/dL (ref 70–99)

## 2014-12-12 MED ORDER — INSULIN STARTER KIT- SYRINGES (ENGLISH)
1.0000 | Freq: Once | Status: DC
  Filled 2014-12-12: qty 1

## 2014-12-12 NOTE — Progress Notes (Addendum)
Patient ID: [name], female   DOB: 07/13/1971, 44 y.o.   MRN: [id number] Pt without complaints GFM  VSSAF FHR Cat 1  Glc levels good control  Abd Gravid, nt Neg homans Bilaterally  43yo G1 at 5931 3/7  with cervical incomp/cerclage Class B DM-metformin tid, insulin HS; excellent control -Continue vaginal prog -Bedrest -Vaginal/spec exam for change in symptoms  Continue in house management until 32 weeks then reassess US for growth at this week -Transverse by ultrasound yesterday

## 2014-12-13 LAB — GLUCOSE, CAPILLARY
GLUCOSE-CAPILLARY: 75 mg/dL (ref 70–99)
Glucose-Capillary: 104 mg/dL — ABNORMAL HIGH (ref 70–99)
Glucose-Capillary: 103 mg/dL — ABNORMAL HIGH (ref 70–99)
Glucose-Capillary: 113 mg/dL — ABNORMAL HIGH (ref 70–99)

## 2014-12-13 NOTE — Progress Notes (Signed)
Patient ID: [name], female   DOB: 08/04/1971, 44 y.o.   MRN: [id number] S: NO CTX'S LEAKING OR BLEEDING O: AF VSS      GRAVID UTERUS NONTENDER      FHR CAT ONE LAST PM      GOOD BLOOD SURGAR CONTROL      SONO ON 3/22 EFW 61%TILE  BREECH A: IUP AT 31.4     INCOMPETENT CERVIX WITH CERCLAGE     CLASS B DIABETES IN METFORMIN AND INSULIN     BREECH P: CONTINUE REST

## 2014-12-14 LAB — GLUCOSE, CAPILLARY
GLUCOSE-CAPILLARY: 102 mg/dL — AB (ref 70–99)
Glucose-Capillary: 79 mg/dL (ref 70–99)
Glucose-Capillary: 105 mg/dL — ABNORMAL HIGH (ref 70–99)
Glucose-Capillary: 132 mg/dL — ABNORMAL HIGH (ref 70–99)

## 2014-12-14 NOTE — Plan of Care (Signed)
Problem: Consults Goal: Diabetes Guidelines per MD order/protocol Outcome: Progressing Pt seen by Diabetic Educator today regarding Insulin self administration

## 2014-12-14 NOTE — Progress Notes (Signed)
Educated patient on insulin vial/syringe and insulin pen use at home. Reviewed contents of insulin flexpen starter kit. Reviewed and deomonstrated all steps of drawing up and administering insulin with vial and syringe. Also reviewed and demonstrated all steps of injection with insulin pen including attachment of needle, 2-unit air shot, dialing up dose, giving injection, removing needle, disposal of sharps, storage of unused insulin, disposal of insulin etc. Patient able to provide successful return demonstration. After learning both ways to inject insulin, patient prefers to use vial and syringe as an outpatient. Reviewed fasting and post prandial glucose goals.  Reviewed signs and symptoms of hyperglycemia and hypoglycemia along with treatment for both. Patient verbalized understanding of information discussed and states that she has no further questions at this time. RNs to provide ongoing basic DM education at bedside with this patient and engage patient to actively check blood glucose, draw up insulin, and administer insulin injections. Will continue to follow while inpatient.  Thanks, Barnie Alderman, RN, MSN, CCRN, CDE Diabetes Coordinator Inpatient Diabetes Program [phone] (Team Pager from [place] to [place]) [phone] ([facility] office) [phone] [facility] office)

## 2014-12-14 NOTE — Progress Notes (Signed)
No complaints. +FM. No vaginal pressure or change in d/c  VSS. AF. Abd: soft, NT Ext: no c/c/e FHT Cat I, flat toco  43yo G1 at 6047w5d with cervical incomp/cerclage, Class B DM -Continue vaginal prog -Bedrest -Per MFM, plan to D/C home at 32 weeks with SVE before discharge -DM-metformin tid, insulin HS; excellent control -Breech by last u/s; C/S for labor  Mitchel HonourMegan Sanna Porcaro, DO

## 2014-12-15 LAB — GLUCOSE, CAPILLARY
GLUCOSE-CAPILLARY: 107 mg/dL — AB (ref 70–99)
Glucose-Capillary: 68 mg/dL — ABNORMAL LOW (ref 70–99)
Glucose-Capillary: 124 mg/dL — ABNORMAL HIGH (ref 70–99)
Glucose-Capillary: 89 mg/dL (ref 70–99)

## 2014-12-15 NOTE — Progress Notes (Signed)
No complaints. +FM. No vaginal pressure or change in d/c  VSS. AF. Abd: soft, NT Ext: no c/c/e FHT Cat I, flat toco  43yo G1 at 5168w6d with cervical incomp/cerclage, Class B DM -Continue vaginal prog -Bedrest -Patient anxious about d/c at 32 weeks as she lives 45 min from hospital.  Will plan to consult MFM Monday for outpt management recommendations. -DM-metformin tid, insulin HS; excellent control -Breech by last u/s; C/S for labor  Mitchel HonourMegan Ysabel Cowgill, DO

## 2014-12-16 LAB — GLUCOSE, CAPILLARY
GLUCOSE-CAPILLARY: 111 mg/dL — AB (ref 70–99)
Glucose-Capillary: 84 mg/dL (ref 70–99)
Glucose-Capillary: 83 mg/dL (ref 70–99)

## 2014-12-16 NOTE — Progress Notes (Signed)
No complaints. +FM. No vaginal pressure or change in d/c  VSS. AF. Abd: soft, NT Ext: no c/c/e FHT Cat I, flat toco  43yo G1 at 6745w0d with cervical incomp/cerclage, Class B DM -Continue vaginal prog -Bedrest -Patient anxious about d/c at 32 weeks as she lives 45 min from hospital. Will plan to consult MFM tomorrow for outpt management recommendations. -DM-metformin tid, insulin HS; excellent control -Breech by last u/s; C/S for labor  Mitchel HonourMegan Misti Towle, DO

## 2014-12-17 ENCOUNTER — Inpatient Hospital Stay (HOSPITAL_COMMUNITY): Payer: BLUE CROSS/BLUE SHIELD

## 2014-12-17 LAB — GLUCOSE, CAPILLARY
GLUCOSE-CAPILLARY: 113 mg/dL — AB (ref 70–99)
Glucose-Capillary: 82 mg/dL (ref 70–99)
Glucose-Capillary: 102 mg/dL — ABNORMAL HIGH (ref 70–99)
Glucose-Capillary: 102 mg/dL — ABNORMAL HIGH (ref 70–99)

## 2014-12-17 NOTE — Consult Note (Signed)
MFM Note  [name] is a 44 year old G1 at 32+1 weeks who has been hospitalized since 25+ weeks for cervical insufficiency. She had a rescue cerclage placed at 22+5 weeks and then was admitted (at 25 weeks) when funneling was discovered to extend beyond to cerclage to the external os. She has had an uncomplicated hospital stay. Prenatal course also complicated by type 2 diabetes that has been controlled with Metformin and nightly long acting insulin.  Most recent fetal US at 31 weeks on 03/22: frank breech presentation; EFW at the 61st %tile; normal AFV  Assessment: 1) SIUP at 32+1 weeks 2) Cervical insufficiency; S/P cerclage placement at 22 weeks 3) Funneling past cerclage with admission at 25 weeks 4) Type 2 DM controlled on Metformin and insulin 5) AMA; low risk NIPS  Both [name] and her husband are extremely anxious about outpatient management. They are very concerned that the baby will be born prematurely with resultant complications and possible disabilities. They are comfortable in the hospital and feel that they need to stay a few more weeks.  Discussed with Dr. Marcelle OverlieHolland.  (Face-to-face consultation with patient: 30 min)

## 2014-12-17 NOTE — Progress Notes (Signed)
7043w1d   S// had some incr ctx yest, better this am  O// BP 120/85 mmHg  Pulse 86  Temp(Src) 98 F (36.7 C) (Oral)  Resp 18  Ht 5\' 5"  (1.651 m)  Wt 182 lb 11.2 oz (82.872 kg)  BMI 30.40 kg/m2  FHR stable  A+P// 2943w1d , breech, hx short cx/ptl, [place], more comft in pt mgmt, will discuss w/ MFM today

## 2014-12-18 LAB — GLUCOSE, CAPILLARY
Glucose-Capillary: 83 mg/dL (ref 70–99)
Glucose-Capillary: 91 mg/dL (ref 70–99)
Glucose-Capillary: 110 mg/dL — ABNORMAL HIGH (ref 70–99)
Glucose-Capillary: 105 mg/dL — ABNORMAL HIGH (ref 70–99)

## 2014-12-18 NOTE — Progress Notes (Signed)
No complaints. +FM. No vaginal pressure or change in d/c  VSS. AF. Abd: soft, NT Ext: no c/c/e FHT Cat I, flat toco  43yo G1 at 32+2 with cervical incomp/cerclage, Class B DM -Continue vaginal prog & inpatient bedrest -Patient anxious about d/c at 32 weeks as she lives 45 min from hospital.plan with MFM is to reconsider @ 34-36w -DM-metformin tid, insulin HS; excellent control -Breech by last u/s; C/S for labor

## 2014-12-19 LAB — GLUCOSE, CAPILLARY
GLUCOSE-CAPILLARY: 122 mg/dL — AB (ref 70–99)
Glucose-Capillary: 92 mg/dL (ref 70–99)
Glucose-Capillary: 109 mg/dL — ABNORMAL HIGH (ref 70–99)
Glucose-Capillary: 134 mg/dL — ABNORMAL HIGH (ref 70–99)

## 2014-12-19 NOTE — Plan of Care (Signed)
Problem: Consults Goal: Diabetes Guidelines per MD order/protocol Outcome: Progressing Pt CBG's stable

## 2014-12-19 NOTE — Progress Notes (Signed)
Patient ID: [name], female   DOB: 11/24/1970, 44 y.o.   MRN: [id number] S: FEW CONTRACTIONS NO BLEEDING OR LEAKAGE O: AF VSS     GRAVID UTERUS NONTENDER     FHR TRACING LAST PM CAT ONE     BLOOD SUGARS EXCELLENT A: IUP AT 32.3 WITH INCOMPETENT CERVIX WITH CERCLAGE IN PLACE     DIABETES ON INSULIN AND METFORMIN GOOD CONTROLL     BREECH P: DAILY NST CONTINUE REST AND PROGESTERONE

## 2014-12-20 LAB — GLUCOSE, CAPILLARY
GLUCOSE-CAPILLARY: 101 mg/dL — AB (ref 70–99)
GLUCOSE-CAPILLARY: 104 mg/dL — AB (ref 70–99)
Glucose-Capillary: 78 mg/dL (ref 70–99)
Glucose-Capillary: 108 mg/dL — ABNORMAL HIGH (ref 70–99)

## 2014-12-20 NOTE — Progress Notes (Signed)
No complaints. +FM. No vaginal pressure or change in d/c  VSS. AF. Abd: soft, NT Ext: no c/c/e FHT Cat I, flat toco  43yo G1 at 5327w4d with cervical incomp/cerclage, Class B DM -Continue vaginal prog -Bedrest -DM-metformin tid, insulin HS; excellent control -Breech by last u/s; C/S for labor  Mitchel HonourMegan Willistine Ferrall, DO

## 2014-12-21 LAB — GLUCOSE, CAPILLARY
GLUCOSE-CAPILLARY: 129 mg/dL — AB (ref 70–99)
Glucose-Capillary: 71 mg/dL (ref 70–99)
Glucose-Capillary: 95 mg/dL (ref 70–99)
Glucose-Capillary: 92 mg/dL (ref 70–99)

## 2014-12-21 NOTE — Progress Notes (Signed)
4625w5d   S// no new complaints, good FM  O// BP 121/80 mmHg  Pulse 96  Temp(Src) 98.2 F (36.8 C) (Oral)  Resp 18  Ht 5\' 5"  (1.651 m)  Wt 184 lb 4.8 oz (83.598 kg)  BMI 30.67 kg/m2  A+P//7425w5d, cerclage, breech...will cont inpt mgmt, good CBG control

## 2014-12-22 LAB — GLUCOSE, CAPILLARY
GLUCOSE-CAPILLARY: 118 mg/dL — AB (ref 70–99)
GLUCOSE-CAPILLARY: 71 mg/dL (ref 70–99)
Glucose-Capillary: 98 mg/dL (ref 70–99)
Glucose-Capillary: 125 mg/dL — ABNORMAL HIGH (ref 70–99)

## 2014-12-22 NOTE — Progress Notes (Signed)
6556w6d  S// no new changes O//BP 132/85 mmHg  Pulse 97  Temp(Src) 97.9 F (36.6 C) (Oral)  Resp 18  Ht 5\' 5"  (1.651 m)  Wt 184 lb 4.8 oz (83.598 kg)  BMI 30.67 kg/m2   FHR stable  A+P// 5256w6d, breech, cerclage with shortened cx, stable

## 2014-12-23 LAB — GLUCOSE, CAPILLARY
GLUCOSE-CAPILLARY: 79 mg/dL (ref 70–99)
Glucose-Capillary: 82 mg/dL (ref 70–99)
Glucose-Capillary: 122 mg/dL — ABNORMAL HIGH (ref 70–99)
Glucose-Capillary: 103 mg/dL — ABNORMAL HIGH (ref 70–99)

## 2014-12-23 NOTE — Progress Notes (Signed)
6335w0d  S//  No changes in sx, good FM O// BP 126/85 mmHg  Pulse 83  Temp(Src) 98.1 F (36.7 C) (Oral)  Resp 18  Ht 5\' 5"  (1.651 m)  Wt 184 lb 4.8 oz (83.598 kg)  BMI 30.67 kg/m2  FHR stable  A+P//6135w0d, cerclage, shortened cx, stable

## 2014-12-24 LAB — GLUCOSE, CAPILLARY
GLUCOSE-CAPILLARY: 76 mg/dL (ref 70–99)
Glucose-Capillary: 96 mg/dL (ref 70–99)
Glucose-Capillary: 124 mg/dL — ABNORMAL HIGH (ref 70–99)
Glucose-Capillary: 110 mg/dL — ABNORMAL HIGH (ref 70–99)

## 2014-12-24 NOTE — Progress Notes (Signed)
No complaints. +FM. No vaginal pressure or change in d/c  VSS. AF. Abd: soft, NT Ext: no c/c/e FHT Cat I, flat toco  43yo G1 at 5233w1dd with cervical incomp/cerclage, Class B DM -Continue vaginal prog -Bedrest -DM-metformin tid, insulin HS; excellent control -Breech by last u/s; C/S for labor  Gina HonourMegan Latayvia Mandujano, DO

## 2014-12-25 LAB — GLUCOSE, CAPILLARY
GLUCOSE-CAPILLARY: 72 mg/dL (ref 70–99)
GLUCOSE-CAPILLARY: 97 mg/dL (ref 70–99)
GLUCOSE-CAPILLARY: 102 mg/dL — AB (ref 70–99)
Glucose-Capillary: 103 mg/dL — ABNORMAL HIGH (ref 70–99)

## 2014-12-25 NOTE — Progress Notes (Signed)
Patient ID: [name], female   DOB: 04/22/1971, 44 y.o.   MRN: [id number] Pt without complaints GFM Rare Ctxs  VSSAF Glc excellent  Abd Gravid nt Neg homans bilaterally  43yo G1 at 33w 2/7 with cervical incomp/cerclage - Class B DM--metformin tid, insulin HS; excellent control -Continue vaginal prog -Bedrest  -Breech by last u/s; C/S for labor

## 2014-12-26 LAB — GLUCOSE, CAPILLARY
GLUCOSE-CAPILLARY: 74 mg/dL (ref 70–99)
Glucose-Capillary: 98 mg/dL (ref 70–99)
Glucose-Capillary: 124 mg/dL — ABNORMAL HIGH (ref 70–99)

## 2014-12-26 NOTE — Progress Notes (Signed)
Patient ID: [name], female   DOB: 11/09/1970, 44 y.o.   MRN: [id number] S: GOOD FETAL MOVEMENT NO LEAKAGE BLEEDING OR CTX'S O: AF VSS      GRAVID UTERUS NONTENDER      FHR REACTIVE YESTERDAY      CBG'S GOOD A: IUP AT 33.3 WITH CERCLAGE     CLASS 2 DIABETES ON METFORMIN AND INSULIN WITH GOOD CONTROL     BREECH BY LAST SONO P; BEDREST AND PROGESTERONE

## 2014-12-27 LAB — GLUCOSE, CAPILLARY
GLUCOSE-CAPILLARY: 91 mg/dL (ref 70–99)
GLUCOSE-CAPILLARY: 110 mg/dL — AB (ref 70–99)
Glucose-Capillary: 93 mg/dL (ref 70–99)
Glucose-Capillary: 107 mg/dL — ABNORMAL HIGH (ref 70–99)

## 2014-12-27 NOTE — Progress Notes (Signed)
Pt without complaints + FM   VSSAF Glc excellent  Abd Gravid nt Neg homans bilaterally  43yo G1 at 33w 4/7 with cervical incomp/cerclage - Class B DM--metformin tid, insulin HS; excellent control -Continue vaginal prog -Bedrest  -Breech by last u/s; C/S for labor

## 2014-12-28 LAB — GLUCOSE, CAPILLARY
GLUCOSE-CAPILLARY: 74 mg/dL (ref 70–99)
GLUCOSE-CAPILLARY: 111 mg/dL — AB (ref 70–99)
Glucose-Capillary: 97 mg/dL (ref 70–99)
Glucose-Capillary: 91 mg/dL (ref 70–99)
Glucose-Capillary: 113 mg/dL — ABNORMAL HIGH (ref 70–99)

## 2014-12-28 NOTE — Progress Notes (Signed)
33 5/7 weeks  No C/O  VSS Afeb Good gluscose control  Uterus soft, NT  A/P  Cx incompetence/cerclage         Cl B DM-metformin/insuliin             Good control         Breech-C/S for labor         Continue BR

## 2014-12-29 LAB — GLUCOSE, CAPILLARY
GLUCOSE-CAPILLARY: 68 mg/dL — AB (ref 70–99)
GLUCOSE-CAPILLARY: 81 mg/dL (ref 70–99)
Glucose-Capillary: 108 mg/dL — ABNORMAL HIGH (ref 70–99)
Glucose-Capillary: 90 mg/dL (ref 70–99)

## 2014-12-29 NOTE — Progress Notes (Signed)
33 6/7 weeks  No C/O  VSS  FHT cat one Good glucose control  A/P: Incompetent Cx-cerclage         Cl B DM-metformin/insulin, good control         Breech-C/S for delivery         D/W patient management-she is concerned over her 45-60 min drive to hospital from her home. Will order U/S for growth later this coming week.

## 2014-12-30 LAB — GLUCOSE, CAPILLARY
GLUCOSE-CAPILLARY: 151 mg/dL — AB (ref 70–99)
Glucose-Capillary: 78 mg/dL (ref 70–99)
Glucose-Capillary: 123 mg/dL — ABNORMAL HIGH (ref 70–99)
Glucose-Capillary: 93 mg/dL (ref 70–99)

## 2014-12-30 NOTE — Progress Notes (Signed)
34 0/7 weeks  No C/O  VSS Afeb Uterus soft, NT Good glucose control FHT cat one  A/P: Incompetent Cx-cerclage         Cl B DM-metformin, insulin, good control         Breech-C/S for delivery

## 2014-12-31 LAB — GLUCOSE, CAPILLARY
GLUCOSE-CAPILLARY: 98 mg/dL (ref 70–99)
GLUCOSE-CAPILLARY: 95 mg/dL (ref 70–99)
Glucose-Capillary: 70 mg/dL (ref 70–99)
Glucose-Capillary: 103 mg/dL — ABNORMAL HIGH (ref 70–99)

## 2014-12-31 NOTE — Progress Notes (Signed)
No complaints. +FM. No vaginal pressure or change in d/c  VSS. AF. Abd: soft, NT Ext: no c/c/e FHT Cat I, flat toco  43yo G1 at 3111w1d with cervical incomp/cerclage, Class B DM -Continue vaginal prog -Bedrest -DM-excellent control -Breech by last u/s; C/S for labor  Mitchel HonourMegan Lavon Horn, DO

## 2015-01-01 ENCOUNTER — Other Ambulatory Visit (HOSPITAL_COMMUNITY): Payer: BLUE CROSS/BLUE SHIELD

## 2015-01-01 LAB — GLUCOSE, CAPILLARY
GLUCOSE-CAPILLARY: 75 mg/dL (ref 70–99)
Glucose-Capillary: 95 mg/dL (ref 70–99)
Glucose-Capillary: 103 mg/dL — ABNORMAL HIGH (ref 70–99)
Glucose-Capillary: 109 mg/dL — ABNORMAL HIGH (ref 70–99)

## 2015-01-01 NOTE — Progress Notes (Signed)
MFM called RN to clarify when pt needed to come for U/S. RN called MD to verify when pt needs next U/S. Relayed to MD that pt's U/S for this Thursday, 04/14, was rescheduled for today, 04/12. Per MD, today's U/S cancelled and rescheduled for Friday, 04/15.

## 2015-01-01 NOTE — Progress Notes (Signed)
Problems: 1.  Cervical incompetence - cerclage in place 2.  Diabetes Mellitus 3.  Breech  S:  Patient is doing well. No complaints.  Had discussion with Dr. Langston MaskerMorris yesterday about potential discharge home.  O:  BP 127/74 mmHg  Pulse 102  Temp(Src) 98.1 F (36.7 C) (Oral)  Resp 18  Ht 5\' 5"  (1.651 m)  Wt 188 lb 12.8 oz (85.639 kg)  BMI 31.42 kg/m2 Results for orders placed or performed during the hospital encounter of 10/31/14 (from the past 24 hour(s))  Glucose, capillary     Status: None   Collection Time: 12/31/14  9:36 AM  Result Value Ref Range   Glucose-Capillary 70 70 - 99 mg/dL  Glucose, capillary     Status: None   Collection Time: 12/31/14 12:19 PM  Result Value Ref Range   Glucose-Capillary 98 70 - 99 mg/dL   Comment 1 Document in Chart    Comment 2 Repeat Test   Glucose, capillary     Status: None   Collection Time: 12/31/14  3:37 PM  Result Value Ref Range   Glucose-Capillary 95 70 - 99 mg/dL   Comment 1 Document in Chart    Comment 2 Repeat Test   Glucose, capillary     Status: Abnormal   Collection Time: 12/31/14  9:02 PM  Result Value Ref Range   Glucose-Capillary 103 (H) 70 - 99 mg/dL   General alert and oriented  IMPRESSION: IUP at 34 w 2 days Cervical incompetence Cerclage in place Diabetes Mellitus Breech  PLAN: Patient mentioned possible discharge on Friday Ultrasound for growth and cervical length this Thursday Digital cervical exam prior to discharge CBGs in excellent control

## 2015-01-02 LAB — GLUCOSE, CAPILLARY
GLUCOSE-CAPILLARY: 66 mg/dL — AB (ref 70–99)
GLUCOSE-CAPILLARY: 96 mg/dL (ref 70–99)
Glucose-Capillary: 98 mg/dL (ref 70–99)
Glucose-Capillary: 88 mg/dL (ref 70–99)

## 2015-01-02 NOTE — Progress Notes (Signed)
S: Patient is doing well. No complaints.   O: BP 127/74 mmHg  Pulse 102  Temp(Src) 98.1 F (36.7 C) (Oral)  Resp 18  Ht 5\' 5"  (1.651 m)  Wt 188 lb 12.8 oz (85.639 kg)  BMI 31.42 kg/m2  Lab Results Last 24 Hours    Results for orders placed or performed during the hospital encounter of 10/31/14 (from the past 24 hour(s))  Glucose, capillary Status: None   Collection Time: 12/31/14 9:36 AM  Result Value Ref Range   Glucose-Capillary 70 70 - 99 mg/dL  Glucose, capillary Status: None   Collection Time: 12/31/14 12:19 PM  Result Value Ref Range   Glucose-Capillary 98 70 - 99 mg/dL   Comment 1 Document in Chart    Comment 2 Repeat Test   Glucose, capillary Status: None   Collection Time: 12/31/14 3:37 PM  Result Value Ref Range   Glucose-Capillary 95 70 - 99 mg/dL   Comment 1 Document in Chart    Comment 2 Repeat Test   Glucose, capillary Status: Abnormal   Collection Time: 12/31/14 9:02 PM  Result Value Ref Range   Glucose-Capillary 103 (H) 70 - 99 mg/dL     General alert and oriented  IMPRESSION: IUP at 34 w 3 days Cervical incompetence Cerclage in place Diabetes Mellitus Breech  PLAN: Patient possible discharge on Friday Ultrasound for growth and cervical length this Thursday Digital cervical exam prior to discharge CBGs in excellent control

## 2015-01-03 LAB — GLUCOSE, CAPILLARY
GLUCOSE-CAPILLARY: 89 mg/dL (ref 70–99)
GLUCOSE-CAPILLARY: 119 mg/dL — AB (ref 70–99)
Glucose-Capillary: 71 mg/dL (ref 70–99)
Glucose-Capillary: 96 mg/dL (ref 70–99)

## 2015-01-03 NOTE — Progress Notes (Signed)
Ur chart review completed.  

## 2015-01-03 NOTE — Progress Notes (Signed)
Patient ID: [name], female   DOB: 11/14/1970, 44 y.o.   MRN: [id number] Pt without complaints GFM Rare Ctxs  VSSAF FHR 140s Cat 1 Ctxs [place]as Glc levels excellent  Abd Gravid nt Neg homans bilaterally   IMPRESSION: IUP at 34 w 4 days Cervical incompetence Cerclage(x2) in place Diabetes Mellitus  excellent control Breech  PLAN: Patient mentioned possible discharge on Friday Ultrasound for growth and cervical length today Digital cervical exam prior to discharge

## 2015-01-04 ENCOUNTER — Inpatient Hospital Stay (HOSPITAL_COMMUNITY): Payer: BLUE CROSS/BLUE SHIELD

## 2015-01-04 DIAGNOSIS — Z3A34 34 weeks gestation of pregnancy: Secondary | ICD-10-CM | POA: Insufficient documentation

## 2015-01-04 DIAGNOSIS — O24919 Unspecified diabetes mellitus in pregnancy, unspecified trimester: Secondary | ICD-10-CM | POA: Insufficient documentation

## 2015-01-04 LAB — GLUCOSE, CAPILLARY
Glucose-Capillary: 81 mg/dL (ref 70–99)
Glucose-Capillary: 103 mg/dL — ABNORMAL HIGH (ref 70–99)

## 2015-01-04 NOTE — Discharge Summary (Signed)
NAMIsaiah Blakes:  [name], [name]              ACCOUNT NO.:  [id number]  MEDICAL RECORD NO.:  [id number]  LOCATION:  9159                          FACILITY:  [facility]  PHYSICIAN:  Juluis MireJohn S. Anshul Meddings, M.D.   DATE OF BIRTH:  [birth year]  DATE OF ADMISSION:  10/31/2014 DATE OF DISCHARGE:  01/04/2015                              DISCHARGE SUMMARY   ADMITTING DIAGNOSIS:  Intrauterine pregnancy at 25 weeks and 3/7th with prior cervical cerclage.  Now with progressive cervical thinning and dilation.  For complete history and physical, see dictated note.  COURSE IN THE HOSPITAL:  The patient was brought in, placed on bed rest. She is placed on vaginal progesterone.  Steroids were given.  Of note, she was a diabetic. We adjusted, so she eventually ended up on metformin and insulin.  She did well with this combination.  She was observed in the hospital.  She had monitoring and serial ultrasounds. Blood sugars remain excellent.  We just did an ultrasound today.  The infant had been breech at this point in time, it is vertex.  Amniotic fluid was normal. Growth was normal.  The cervix was still unmeasurable.  I did examine her today, cervix was closer, cluster in place was 80% effaced with the vertex presenting.  She is presently e 34 weeks and 5 days.  Decision was to discharge her home to continue the rest at home on  progesterone, she will continue to manage her diabetes at home also.  In terms of complication as noted above.  The patient discharged home stable condition.  DISPOSITION:  The patient is to be at relative rest.  No vaginal entrance.  Continue metformin and insulin at home.  Watch her blood sugars.  Proper parameters have been discussed.  She will continue progesterone vaginal tablets.  Preterm labor warning signs are given, will follow up in the office early next week.     Juluis MireJohn S. Wilberta Dorvil, M.D.     JSM/MEDQ  D:  01/04/2015  T:  01/04/2015  Job:  [id number]

## 2015-01-04 NOTE — Discharge Instructions (Signed)
Fetal Movement Counts Patient Name: _[name]_________________________________________________ Patient Due Date: _5/22/2016___________________ Performing a fetal movement count is highly recommended in high-risk pregnancies, but it is good for every pregnant woman to do. Your health care provider may ask you to start counting fetal movements at 28 weeks of the pregnancy. Fetal movements often increase:  After eating a full meal.  After physical activity.  After eating or drinking something sweet or cold.  At rest. Pay attention to when you feel the baby is most active. This will help you notice a pattern of your baby's sleep and wake cycles and what factors contribute to an increase in fetal movement. It is important to perform a fetal movement count at the same time each day when your baby is normally most active.  HOW TO COUNT FETAL MOVEMENTS  Find a quiet and comfortable area to sit or lie down on your left side. Lying on your left side provides the best blood and oxygen circulation to your baby.  Write down the day and time on a sheet of paper or in a journal.  Start counting kicks, flutters, swishes, rolls, or jabs in a 2-hour period. You should feel at least 10 movements within 2 hours.  If you do not feel 10 movements in 2 hours, wait 2-3 hours and count again. Look for a change in the pattern or not enough counts in 2 hours. SEEK MEDICAL CARE IF:  You feel less than 10 counts in 2 hours, tried twice.  There is no movement in over an hour.  The pattern is changing or taking longer each day to reach 10 counts in 2 hours.  You feel the baby is not moving as he or she usually does. Date: ____________ Movements: ____________ Start time: ____________ Doreatha MartinFinish time: ____________  Date: ____________ Movements: ____________ Start time: ____________ Doreatha MartinFinish time: ____________ Date: ____________ Movements: ____________ Start time: ____________ Doreatha MartinFinish time: ____________ Date:  ____________ Movements: ____________ Start time: ____________ Doreatha MartinFinish time: ____________ Date: ____________ Movements: ____________ Start time: ____________ Doreatha MartinFinish time: ____________ Date: ____________ Movements: ____________ Start time: ____________ Doreatha MartinFinish time: ____________ Date: ____________ Movements: ____________ Start time: ____________ Doreatha MartinFinish time: ____________ Date: ____________ Movements: ____________ Start time: ____________ Doreatha MartinFinish time: ____________  Date: ____________ Movements: ____________ Start time: ____________ Doreatha MartinFinish time: ____________ Date: ____________ Movements: ____________ Start time: ____________ Doreatha MartinFinish time: ____________ Date: ____________ Movements: ____________ Start time: ____________ Doreatha MartinFinish time: ____________ Date: ____________ Movements: ____________ Start time: ____________ Doreatha MartinFinish time: ____________ Date: ____________ Movements: ____________ Start time: ____________ Doreatha MartinFinish time: ____________ Date: ____________ Movements: ____________ Start time: ____________ Doreatha MartinFinish time: ____________ Date: ____________ Movements: ____________ Start time: ____________ Doreatha MartinFinish time: ____________  Date: ____________ Movements: ____________ Start time: ____________ Doreatha MartinFinish time: ____________ Date: ____________ Movements: ____________ Start time: ____________ Doreatha MartinFinish time: ____________ Date: ____________ Movements: ____________ Start time: ____________ Doreatha MartinFinish time: ____________ Date: ____________ Movements: ____________ Start time: ____________ Doreatha MartinFinish time: ____________ Date: ____________ Movements: ____________ Start time: ____________ Doreatha MartinFinish time: ____________ Date: ____________ Movements: ____________ Start time: ____________ Doreatha MartinFinish time: ____________ Date: ____________ Movements: ____________ Start time: ____________ Doreatha MartinFinish time: ____________  Date: ____________ Movements: ____________ Start time: ____________ Doreatha MartinFinish time: ____________ Date: ____________ Movements: ____________ Start  time: ____________ Doreatha MartinFinish time: ____________ Date: ____________ Movements: ____________ Start time: ____________ Doreatha MartinFinish time: ____________ Date: ____________ Movements: ____________ Start time: ____________ Doreatha MartinFinish time: ____________ Date: ____________ Movements: ____________ Start time: ____________ Doreatha MartinFinish time: ____________ Date: ____________ Movements: ____________ Start time: ____________ Doreatha MartinFinish time: ____________ Date: ____________ Movements: ____________ Start time: ____________ Doreatha MartinFinish time: ____________  Date: ____________ Movements: ____________ Start time: ____________  Finish time: ____________ Date: ____________ Movements: ____________ Start time: ____________ Doreatha Martin time: ____________ Date: ____________ Movements: ____________ Start time: ____________ Doreatha Martin time: ____________ Date: ____________ Movements: ____________ Start time: ____________ Doreatha Martin time: ____________ Date: ____________ Movements: ____________ Start time: ____________ Doreatha Martin time: ____________ Date: ____________ Movements: ____________ Start time: ____________ Doreatha Martin time: ____________ Date: ____________ Movements: ____________ Start time: ____________ Doreatha Martin time: ____________  Date: ____________ Movements: ____________ Start time: ____________ Doreatha Martin time: ____________ Date: ____________ Movements: ____________ Start time: ____________ Doreatha Martin time: ____________ Date: ____________ Movements: ____________ Start time: ____________ Doreatha Martin time: ____________ Date: ____________ Movements: ____________ Start time: ____________ Doreatha Martin time: ____________ Date: ____________ Movements: ____________ Start time: ____________ Doreatha Martin time: ____________ Date: ____________ Movements: ____________ Start time: ____________ Doreatha Martin time: ____________ Date: ____________ Movements: ____________ Start time: ____________ Doreatha Martin time: ____________  Date: ____________ Movements: ____________ Start time: ____________ Doreatha Martin time:  ____________ Date: ____________ Movements: ____________ Start time: ____________ Doreatha Martin time: ____________ Date: ____________ Movements: ____________ Start time: ____________ Doreatha Martin time: ____________ Date: ____________ Movements: ____________ Start time: ____________ Doreatha Martin time: ____________ Date: ____________ Movements: ____________ Start time: ____________ Doreatha Martin time: ____________ Date: ____________ Movements: ____________ Start time: ____________ Doreatha Martin time: ____________ Date: ____________ Movements: ____________ Start time: ____________ Doreatha Martin time: ____________  Date: ____________ Movements: ____________ Start time: ____________ Doreatha Martin time: ____________ Date: ____________ Movements: ____________ Start time: ____________ Doreatha Martin time: ____________ Date: ____________ Movements: ____________ Start time: ____________ Doreatha Martin time: ____________ Date: ____________ Movements: ____________ Start time: ____________ Doreatha Martin time: ____________ Date: ____________ Movements: ____________ Start time: ____________ Doreatha Martin time: ____________ Date: ____________ Movements: ____________ Start time: ____________ Doreatha Martin time: ____________ Document Released: 10/07/2006 Document Revised: 01/22/2014 Document Reviewed: 07/04/2012 [employer] Patient Information 2015 [employer]. This information is not intended to replace advice given to you by your health care provider. Make sure you discuss any questions you have with your health care provider. Braxton Hicks Contractions Contractions of the uterus can occur throughout pregnancy. Contractions are not always a sign that you are in labor.  WHAT ARE BRAXTON HICKS CONTRACTIONS?  Contractions that occur before labor are called Braxton Hicks contractions, or false labor. Toward the end of pregnancy (32-34 weeks), these contractions can develop more often and may become more forceful. This is not true labor because these contractions do not result in opening (dilatation)  and thinning of the cervix. They are sometimes difficult to tell apart from true labor because these contractions can be forceful and people have different pain tolerances. You should not feel embarrassed if you go to the hospital with false labor. Sometimes, the only way to tell if you are in true labor is for your health care provider to look for changes in the cervix. If there are no prenatal problems or other health problems associated with the pregnancy, it is completely safe to be sent home with false labor and await the onset of true labor. HOW CAN YOU TELL THE DIFFERENCE BETWEEN TRUE AND FALSE LABOR? False Labor  The contractions of false labor are usually shorter and not as hard as those of true labor.   The contractions are usually irregular.   The contractions are often felt in the front of the lower abdomen and in the groin.   The contractions may go away when you walk around or change positions while lying down.   The contractions get weaker and are shorter lasting as time goes on.   The contractions do not usually become progressively stronger, regular, and closer together as with true labor.  True Labor  Contractions in true labor last 30-70 seconds,  become very regular, usually become more intense, and increase in frequency.   The contractions do not go away with walking.   The discomfort is usually felt in the top of the uterus and spreads to the lower abdomen and low back.   True labor can be determined by your health care provider with an exam. This will show that the cervix is dilating and getting thinner.  WHAT TO REMEMBER  Keep up with your usual exercises and follow other instructions given by your health care provider.   Take medicines as directed by your health care provider.   Keep your regular prenatal appointments.   Eat and drink lightly if you think you are going into labor.   If Braxton Hicks contractions are making you uncomfortable:    Change your position from lying down or resting to walking, or from walking to resting.   Sit and rest in a tub of warm water.   Drink 2-3 glasses of water. Dehydration may cause these contractions.   Do slow and deep breathing several times an hour.  WHEN SHOULD I SEEK IMMEDIATE MEDICAL CARE? Seek immediate medical care if:  Your contractions become stronger, more regular, and closer together.   You have fluid leaking or gushing from your vagina.   You have a fever.   You pass blood-tinged mucus.   You have vaginal bleeding.   You have continuous abdominal pain.   You have low back pain that you never had before.   You feel your baby's head pushing down and causing pelvic pressure.   Your baby is not moving as much as it used to.  Document Released: 09/07/2005 Document Revised: 09/12/2013 Document Reviewed: 06/19/2013 [facility] Patient Information 2015 [place], [place]. This information is not intended to replace advice given to you by your health care provider. Make sure you discuss any questions you have with your health care provider.

## 2015-01-04 NOTE — Discharge Summary (Signed)
  Patient name  [name], [name] DICTATION# [id number] CSN# [id number]  Juluis MireMCCOMB,Cristiano Capri S, MD 01/04/2015 12:21 PM

## 2015-01-04 NOTE — Progress Notes (Signed)
Patient ID: [name], female   DOB: 03/11/1971, 44 y.o.   MRN: [id number] S: NO LEAKAGE OR BLEEDING O: AV VSS      CERVIX CLOSED 90 % BREECH AT -1      FHR REACTIVE YESTERDAY      GOOD SUGAR CONTROL A:  IUP AT 34.5      INCOMPETENT CERVIX WITH CERCLAGE      CLASS 2 DIABETES WELL CONTROLLED P: SONO TODAY PENDING RESULTS WILL D/C HOME

## 2015-01-14 ENCOUNTER — Inpatient Hospital Stay (HOSPITAL_COMMUNITY)
Admission: AD | Admit: 2015-01-14 | Discharge: 2015-01-22 | DRG: 981 | Disposition: A | Discharge status: Home / Self Care | Payer: BLUE CROSS/BLUE SHIELD | Source: Ambulatory Visit | Attending: Obstetrics and Gynecology | Admitting: Obstetrics and Gynecology

## 2015-01-14 ENCOUNTER — Encounter (HOSPITAL_COMMUNITY): Payer: Self-pay | Admitting: *Deleted

## 2015-01-14 DIAGNOSIS — O2412 Pre-existing diabetes mellitus, type 2, in childbirth: Secondary | ICD-10-CM | POA: Diagnosis present

## 2015-01-14 DIAGNOSIS — Z90721 Acquired absence of ovaries, unilateral: Secondary | ICD-10-CM | POA: Diagnosis present

## 2015-01-14 DIAGNOSIS — Z3689 Encounter for other specified antenatal screening: Secondary | ICD-10-CM

## 2015-01-14 DIAGNOSIS — E119 Type 2 diabetes mellitus without complications: Secondary | ICD-10-CM | POA: Diagnosis present

## 2015-01-14 DIAGNOSIS — Z3A36 36 weeks gestation of pregnancy: Secondary | ICD-10-CM | POA: Diagnosis present

## 2015-01-14 DIAGNOSIS — O3433 Maternal care for cervical incompetence, third trimester: Secondary | ICD-10-CM | POA: Diagnosis not present

## 2015-01-14 DIAGNOSIS — O2203 Varicose veins of lower extremity in pregnancy, third trimester: Secondary | ICD-10-CM | POA: Diagnosis present

## 2015-01-14 DIAGNOSIS — N883 Incompetence of cervix uteri: Secondary | ICD-10-CM | POA: Diagnosis present

## 2015-01-14 DIAGNOSIS — Z794 Long term (current) use of insulin: Secondary | ICD-10-CM

## 2015-01-14 DIAGNOSIS — Z3490 Encounter for supervision of normal pregnancy, unspecified, unspecified trimester: Secondary | ICD-10-CM

## 2015-01-14 DIAGNOSIS — Z23 Encounter for immunization: Secondary | ICD-10-CM

## 2015-01-14 LAB — COMPREHENSIVE METABOLIC PANEL
ALBUMIN: 3.1 g/dL — AB (ref 3.5–5.2)
ALK PHOS: 81 U/L (ref 39–117)
ALT: 16 U/L (ref 0–35)
ANION GAP: 9 (ref 5–15)
AST: 20 U/L (ref 0–37)
BILIRUBIN TOTAL: 0.5 mg/dL (ref 0.3–1.2)
BUN: 15 mg/dL (ref 6–23)
CALCIUM: 8.9 mg/dL (ref 8.4–10.5)
CO2: 20 mmol/L (ref 19–32)
Chloride: 106 mmol/L (ref 96–112)
Creatinine, Ser: 0.85 mg/dL (ref 0.50–1.10)
GFR calc Af Amer: >90 mL/min (ref >90)
GFR, EST NON AFRICAN AMERICAN: 84 mL/min — AB (ref >90)
Glucose, Bld: 79 mg/dL (ref 70–99)
Potassium: 4.2 mmol/L (ref 3.5–5.1)
Sodium: 135 mmol/L (ref 135–145)
Total Protein: 6.8 g/dL (ref 6.0–8.3)

## 2015-01-14 LAB — CBC
HEMATOCRIT: 35.6 % — AB (ref 36.0–46.0)
Hemoglobin: 12.4 g/dL (ref 12.0–15.0)
MCH: 29.4 pg (ref 26.0–34.0)
MCHC: 34.8 g/dL (ref 30.0–36.0)
MCV: 84.4 fL (ref 78.0–100.0)
PLATELETS: 164 10*3/uL (ref 150–400)
RBC: 4.22 MIL/uL (ref 3.87–5.11)
RDW: 14 % (ref 11.5–15.5)
WBC: 10.8 10*3/uL — AB (ref 4.0–10.5)

## 2015-01-14 LAB — TYPE AND SCREEN
ABO/RH(D): A POS
Antibody Screen: NEGATIVE

## 2015-01-14 MED ORDER — SODIUM CHLORIDE 0.9 % IJ SOLN: 3.0000 mL | Freq: Two times a day (BID) | INTRAMUSCULAR | Status: DC

## 2015-01-14 MED ORDER — SODIUM CHLORIDE 0.9 % IJ SOLN: 3.0000 mL | INTRAMUSCULAR | Status: DC | PRN

## 2015-01-14 MED ORDER — NITROFURANTOIN MONOHYD MACRO 100 MG PO CAPS: 500.0000 mg | ORAL_CAPSULE | Freq: Three times a day (TID) | ORAL | Status: DC

## 2015-01-14 MED ORDER — INSULIN NPH (HUMAN) (ISOPHANE) 100 UNIT/ML ~~LOC~~ SUSP
10.0000 [IU] | Freq: Every day | SUBCUTANEOUS | Status: DC
  Administered 2015-01-14 – 2015-01-19 (×6): 10 [IU] via SUBCUTANEOUS
  Filled 2015-01-14: qty 10

## 2015-01-14 MED ORDER — ZOLPIDEM TARTRATE 5 MG PO TABS: 5.0000 mg | ORAL_TABLET | Freq: Every evening | ORAL | Status: DC | PRN

## 2015-01-14 MED ORDER — METFORMIN HCL 500 MG PO TABS
500.0000 mg | ORAL_TABLET | Freq: Three times a day (TID) | ORAL | Status: DC
  Administered 2015-01-14 – 2015-01-19 (×16): 500 mg via ORAL
  Filled 2015-01-14 (×21): qty 1

## 2015-01-14 MED ORDER — ACETAMINOPHEN 325 MG PO TABS: 650.0000 mg | ORAL_TABLET | ORAL | Status: DC | PRN

## 2015-01-14 MED ORDER — SODIUM CHLORIDE 0.9 % IV SOLN: 250.0000 mL | INTRAVENOUS | Status: DC | PRN

## 2015-01-14 MED ORDER — PRENATAL MULTIVITAMIN CH
1.0000 | ORAL_TABLET | Freq: Every day | ORAL | Status: DC
  Administered 2015-01-15 – 2015-01-19 (×5): 1 via ORAL
  Filled 2015-01-14 (×5): qty 1

## 2015-01-14 MED ORDER — DOCUSATE SODIUM 100 MG PO CAPS
100.0000 mg | ORAL_CAPSULE | Freq: Every day | ORAL | Status: DC
  Administered 2015-01-15 – 2015-01-16 (×2): 100 mg via ORAL
  Filled 2015-01-14 (×2): qty 1

## 2015-01-14 MED ORDER — CALCIUM CARBONATE ANTACID 500 MG PO CHEW: 2.0000 | CHEWABLE_TABLET | ORAL | Status: DC | PRN

## 2015-01-14 NOTE — H&P (Signed)
[name] is a 44 y.o. female prior cerclage. Now at 36 weeks.  In office cerclage torn and cervix 4 cm.  Blood pressure also elevated,  Admitted for observation.  GBS negative. Maternal Medical History:  Prenatal complications: Incompetent cervix  Prenatal Complications - Diabetes: type 2. Diabetes is managed by insulin injections and oral agent (monotherapy).      OB History    Gravida Para Term Preterm AB TAB SAB Ectopic Multiple Living   1 0        0     Past Medical History  Diagnosis Date  . Diabetes mellitus without complication   . Infection     bladder infection  . Ovarian cyst   . Vaginal Pap smear, abnormal     laser removal of abn cells; normal since  . Varicose veins during pregnancy, antepartum    Past Surgical History  Procedure Laterality Date  . Ovarian cyst removal    . Oophorectomy    . Cervical cerclage N/A 10/12/2014    Procedure: CERCLAGE CERVICAL;  Surgeon: Turner Danielsavid C Lowe, MD;  Location: [facility];  Service: Gynecology;  Laterality: N/A;   Family History: family history includes Cancer in her maternal grandfather; Diabetes in her mother; Heart disease in her father and other; Hypertension in her father and mother; Stroke in her maternal grandmother and mother. Social History:  reports that she has never smoked. She has never used smokeless tobacco. She reports that she drinks alcohol. She reports that she does not use illicit drugs.   Prenatal Transfer Tool  Maternal Diabetes: Yes:  Diabetes Type:  Insulin/Medication controlled Genetic Screening: Normal Maternal Ultrasounds/Referrals: Normal Fetal Ultrasounds or other Referrals:  None Maternal Substance Abuse:  No Significant Maternal Medications:  None Significant Maternal Lab Results:  None Other Comments:  None  ROS    Blood pressure 134/89, pulse 109, resp. rate 16. Maternal Exam:  Uterine Assessment: Contraction strength is mild.  Contraction frequency is irregular.   Abdomen: Patient  reports no abdominal tenderness. Fundal height is c/w dates.   Fetal presentation: vertex  Cervix: 4 cm cerclage torn  Physical Exam  Cardiovascular: Normal rate, regular rhythm, normal heart sounds and intact distal pulses.   Respiratory: Effort normal and breath sounds normal.  GI: Soft. Bowel sounds are normal.    Prenatal labs: ABO, Rh: --/--/A POS, A POS (02/10 1808) Antibody: NEG (02/10 1808) Rubella: Nonimmune (11/25 0000) RPR: Nonreactive (11/25 0000)  HBsAg: Negative (11/25 0000)  HIV: Non-reactive (11/25 0000)  GBS:     Assessment/Plan: iup at 36 weeks with incompetent cervix and torn cerclage Type 2 diabetes on insulin and oral meds  Slight elevation of BP Rest and reassess in the AM [facility] HMCCOMB,[name] 01/14/2015, 4:40 PM

## 2015-01-15 LAB — GLUCOSE, CAPILLARY
GLUCOSE-CAPILLARY: 131 mg/dL — AB (ref 70–99)
Glucose-Capillary: 112 mg/dL — ABNORMAL HIGH (ref 70–99)
Glucose-Capillary: 57 mg/dL — ABNORMAL LOW (ref 70–99)
Glucose-Capillary: 111 mg/dL — ABNORMAL HIGH (ref 70–99)
Glucose-Capillary: 101 mg/dL — ABNORMAL HIGH (ref 70–99)

## 2015-01-15 NOTE — Progress Notes (Signed)
Pt denies ctx, vb and LOF.  Good FM.  No HA, n/v, or visual changes  AF, VSS  BP 130/80s BG - good control Gen - NAD Abd - gravid, NT Ext - NT  A/P:  Pt with cervical insufficiency and cerclage Discussed with pt option of cerclage removal now or at 37wks Plan made with patient to continue cerclage to 37wks unless labor, bleeding, or ROM Continue hospital management given distance from hospital

## 2015-01-15 NOTE — Progress Notes (Signed)
Inpatient Diabetes Program Recommendations  Diabetes Treatment Program Recommendations  ADA Standards of Care 2016 Diabetes in Pregnancy Target Glucose Ranges:  Fasting: 60 - 90 mg/dL Preprandial: 60 - 161105 mg/dL 1 hr postprandial: Less than 140mg /dL (from first bite of meal) 2 hr postprandial: Less than 120 mg/dL (from first bit of meal)   Noted slightly low fasting this am at 57 mg/dl. (listed in home meds as well).Agree to hold the NPH 10 units this am and tonight. Pt may need during the day, but does not appear to need at this point. No NPH this am, and glucose thus far is controlled.  Thank you Lenor CoffinAnn Percilla Tweten, RN, MSN, CDE  Diabetes Inpatient Program Office: [phone] Pager: [phone] 8:00 am to 5:00 pm

## 2015-01-15 NOTE — Lactation Note (Signed)
Lactation Consultation Note  Patient Name: Liberty HandyWendy Calo ZOXWR'UToday's Date: 01/15/2015   Patient's nurse suggested meeting with this mom to see if she had any questions as mom has been readmitted after a week at home. Mom states that she read the materials/NICU booklet given to her by lactation during her earlier stay. Enc mom that [facility] to help her with breastfeeding when the baby is born.  Maternal Data    Feeding    [facility]                      Lactation Tools Discussed/Used     Consult Status      Geralynn OchsWILLIARD, Livian Vanderbeck 01/15/2015, 4:57 PM

## 2015-01-16 DIAGNOSIS — E119 Type 2 diabetes mellitus without complications: Secondary | ICD-10-CM | POA: Diagnosis present

## 2015-01-16 DIAGNOSIS — O2203 Varicose veins of lower extremity in pregnancy, third trimester: Secondary | ICD-10-CM | POA: Diagnosis present

## 2015-01-16 DIAGNOSIS — Z90721 Acquired absence of ovaries, unilateral: Secondary | ICD-10-CM | POA: Diagnosis present

## 2015-01-16 DIAGNOSIS — O2412 Pre-existing diabetes mellitus, type 2, in childbirth: Secondary | ICD-10-CM | POA: Diagnosis present

## 2015-01-16 DIAGNOSIS — O3433 Maternal care for cervical incompetence, third trimester: Secondary | ICD-10-CM | POA: Diagnosis present

## 2015-01-16 DIAGNOSIS — Z794 Long term (current) use of insulin: Secondary | ICD-10-CM | POA: Diagnosis not present

## 2015-01-16 DIAGNOSIS — Z3A36 36 weeks gestation of pregnancy: Secondary | ICD-10-CM | POA: Diagnosis present

## 2015-01-16 DIAGNOSIS — Z23 Encounter for immunization: Secondary | ICD-10-CM | POA: Diagnosis not present

## 2015-01-16 LAB — GLUCOSE, CAPILLARY
GLUCOSE-CAPILLARY: 71 mg/dL (ref 70–99)
GLUCOSE-CAPILLARY: 88 mg/dL (ref 70–99)
GLUCOSE-CAPILLARY: 115 mg/dL — AB (ref 70–99)
Glucose-Capillary: 61 mg/dL — ABNORMAL LOW (ref 70–99)
Glucose-Capillary: 60 mg/dL — ABNORMAL LOW (ref 70–99)

## 2015-01-16 NOTE — Progress Notes (Signed)
Inpatient Diabetes Program Recommendations  Diabetes Treatment Program Recommendations  ADA Standards of Care 2016 Diabetes in Pregnancy Target Glucose Ranges:  Fasting: 60 - 90 mg/dL Preprandial: 60 - 161105 mg/dL 1 hr postprandial: Less than 140mg /dL (from first bite of meal) 2 hr postprandial: Less than 120 mg/dL (from first bit of meal)     Results for Liberty HandyFARTHING, Adelia (MRN [id number]) as of 01/16/2015 08:44  Ref. Range 01/15/2015 08:03 01/15/2015 10:28 01/15/2015 14:32 01/15/2015 20:07  Glucose-Capillary Latest Ref Range: 70-99 mg/dL 57 (L) 811111 (H) 914101 (H) 131 (H)    Results for Liberty HandyFARTHING, Rheanna (MRN [id number]) as of 01/16/2015 08:44  Ref. Range 01/16/2015 08:12 01/16/2015 08:33 01/16/2015 08:34  Glucose-Capillary Latest Ref Range: 70-99 mg/dL 61 (L) 60 (L) 71     Home DM Meds: Metformin 500 mg tid       NPH insulin- 10 units QHS  Current DM Meds: Metformin 500 mg tid         NPH insulin- 10 units QHS    **Note patient with Hypoglycemia yesterday morning after receiving 10 units NPH insulin the night prior (CBG was 57 mg/dl yesterday AM).  **Patient's CBG 61 mg/dl this morning after 10 units NPH insulin last PM.     MD- Please consider decreasing NPH insulin to 8 units QHS    Will follow Ambrose FinlandJeannine Johnston Ash Mcelwain RN, MSN, CDE Diabetes Coordinator Inpatient Diabetes Program Team Pager: [phone] (8a-5p)

## 2015-01-16 NOTE — Progress Notes (Signed)
Ur chart review completed.  

## 2015-01-16 NOTE — Progress Notes (Signed)
S:  Patient is doing well. Reports good fetal movement. No contractions.  O:  BP 134/85 mmHg  Pulse 87  Temp(Src) 97.7 F (36.5 C) (Oral)  Resp 18  Ht 5\' 5"  (1.651 m)  Wt 83.734 kg (184 lb 9.6 oz)  BMI 30.72 kg/m2 Results for orders placed or performed during the hospital encounter of 01/14/15 (from the past 24 hour(s))  Glucose, capillary     Status: Abnormal   Collection Time: 01/15/15 10:28 AM  Result Value Ref Range   Glucose-Capillary 111 (H) 70 - 99 mg/dL   Comment 1 Document in Chart    Comment 2 Repeat Test   Glucose, capillary     Status: Abnormal   Collection Time: 01/15/15  2:32 PM  Result Value Ref Range   Glucose-Capillary 101 (H) 70 - 99 mg/dL   Comment 1 Document in Chart    Comment 2 Repeat Test   Glucose, capillary     Status: Abnormal   Collection Time: 01/15/15  8:07 PM  Result Value Ref Range   Glucose-Capillary 131 (H) 70 - 99 mg/dL  Glucose, capillary     Status: Abnormal   Collection Time: 01/16/15  8:12 AM  Result Value Ref Range   Glucose-Capillary 61 (L) 70 - 99 mg/dL   Comment 1 Notify RN    Comment 2 Document in Chart    General alert and oriented Abdomen is soft and non tender  IMPRESSION: IUP at 36 w 3 days Diabetes Mellitus Elevated BPS Cervical incompetence  PLAN: Continue in house observation until 37 weeks and then remainder of cerclage to be removed Continue blood sugar management Monitor Blood pressures  - they have improved since admission

## 2015-01-17 ENCOUNTER — Inpatient Hospital Stay (HOSPITAL_COMMUNITY): Payer: BLUE CROSS/BLUE SHIELD

## 2015-01-17 LAB — GLUCOSE, CAPILLARY
GLUCOSE-CAPILLARY: 68 mg/dL — AB (ref 70–99)
GLUCOSE-CAPILLARY: 153 mg/dL — AB (ref 70–99)
GLUCOSE-CAPILLARY: 104 mg/dL — AB (ref 70–99)
Glucose-Capillary: 112 mg/dL — ABNORMAL HIGH (ref 70–99)
Glucose-Capillary: 102 mg/dL — ABNORMAL HIGH (ref 70–99)

## 2015-01-17 LAB — TYPE AND SCREEN
ABO/RH(D): A POS
ANTIBODY SCREEN: NEGATIVE

## 2015-01-17 MED ORDER — INSULIN NPH (HUMAN) (ISOPHANE) 100 UNIT/ML ~~LOC~~ SUSP: 10.0000 [IU] | Freq: Every day | SUBCUTANEOUS | Status: DC

## 2015-01-17 MED ORDER — DOCUSATE SODIUM 100 MG PO CAPS
200.0000 mg | ORAL_CAPSULE | Freq: Two times a day (BID) | ORAL | Status: DC
  Administered 2015-01-17 – 2015-01-18 (×3): 100 mg via ORAL
  Administered 2015-01-18 – 2015-01-19 (×3): 200 mg via ORAL
  Filled 2015-01-17 (×6): qty 2

## 2015-01-17 MED ORDER — METFORMIN HCL 500 MG PO TABS: 500.0000 mg | ORAL_TABLET | Freq: Three times a day (TID) | ORAL | Status: DC

## 2015-01-17 MED ORDER — PRENATAL MULTIVITAMIN CH: 1.0000 | ORAL_TABLET | Freq: Every day | ORAL | Status: DC

## 2015-01-17 NOTE — Progress Notes (Signed)
Antenatal Nutrition Assessment:  Currently  36 4/[redacted] weeks gestation, with incomp cervix/ torn cervix. Height  65 "  Weight 184 lbs  pre-pregnancy weight 195 lbs .  Pre-pregnancy  BMI 32.5  IBW 125 lbs Total weight gain 0.lbs Weight gain goals 11-20 lbs Estimated needs: 2000-2200 kcal/day, 85-95 grams protein/day, 2.3 liters fluid/day  Carbohydrate modified gestational diet Current diet prescription will provide for increased needs.  Nutrition related labs  CBG (last 3)   Recent Labs  01/16/15 1517 01/16/15 2135 01/17/15 0807  GLUCAP 112* 115* 68*    Nutrition Dx: Increased nutrient needs r/t pregnancy and fetal growth requirements aeb [redacted] weeks gestation.  No educational needs assessed at this time.Pt received diet education at her endocrinologists office on 12/16 and 12/28. She is well versed in [facility] counting/diet parameters  Noted delivery planned at 37 weeks  Elisabeth CaraKatherine Harwood Nall M.Odis LusterEd. R.D. LDN Neonatal Nutrition Support Specialist/RD III Pager [phone]

## 2015-01-17 NOTE — Progress Notes (Signed)
7784w4d   S// no new complaints O// BP 119/85 mmHg  Pulse 84  Temp(Src) 98.6 F (37 C) (Oral)  Resp 18  Ht 5\' 5"  (1.651 m)  Wt 184 lb 9.6 oz (83.734 kg)  BMI 30.72 kg/m2 FHR stable  A+P/6584w4d, torn cerclage, GDM(metformin)..will check US today to verify vtx, plan cerclage removal with AROM @ 37

## 2015-01-18 LAB — GLUCOSE, CAPILLARY
GLUCOSE-CAPILLARY: 107 mg/dL — AB (ref 70–99)
GLUCOSE-CAPILLARY: 109 mg/dL — AB (ref 70–99)
Glucose-Capillary: 65 mg/dL — ABNORMAL LOW (ref 70–99)
Glucose-Capillary: 99 mg/dL (ref 70–99)

## 2015-01-18 NOTE — Progress Notes (Signed)
Patient ID: [name], female   DOB: 10/10/1970, 44 y.o.   MRN: [id number] Pt without complaints GFM Occas ctxs  VSSAF Glc levels good FHR 140s + accels Ctxs occas  Abd Gravid nt  IMPRESSION: IUP at 936 w 5 days Diabetes Mellitus Cervical incompetence BPs improved  PLAN: Continue in house observation until 37 weeks and then remainder of cerclage to be removed Continue blood sugar management.  Will ask for recommendations for post delivery management of DM I discussed the plan with the patient regarding removal of the cerclage at 37 weeks and most likely delivery afterwards due to the fact she is 4cm now with complete effacement.  We discussed the risks of early term delivery including respiratory difficulty, neonatal hypoglycemia, etc  Her questions were answered. Pt also wishes BTL.  Discussed the procedure, R&B, and how and when to do it.  I also discussed the possibility of IUD or vasectomy.

## 2015-01-18 NOTE — Plan of Care (Signed)
Problem: Consults Goal: Birthing Suites Patient Information Press F2 to bring up selections list   Pt < [redacted] weeks EGA     

## 2015-01-18 NOTE — Progress Notes (Signed)
Noted Consult for post partum planning regarding glycemic control. Patient has a history of Type 2 diabetes prior to pregnancy and was taking Metformin as an outpatient prior to pregnancy. Once patient delivers, anticipate insulin needs to change significantly since she will no longer have resistance to insulin from pregnancy hormones. Recommend monitoring CBGs ACHS (before meals and at bedtime) and if CBGs consistently greater than 120 mg/dl then order Metformin 161500 mg QAM and titrate dose up as needed and have patient follow up with PCP for further DM medication adjustments. Diabetes Coordinator will continue to follow as well while inpatient. If MD needs additional assistance from diabetes coordinator please page (on call everyday from 8am - 5pm).  Thanks, Orlando PennerMarie Ho Parisi, RN, MSN, CCRN, CDE Diabetes Coordinator Inpatient Diabetes Program [phone] (Team Pager from 8am to 5pm) [phone] ([facility] office) [phone] [facility] office)

## 2015-01-19 LAB — GLUCOSE, CAPILLARY
GLUCOSE-CAPILLARY: 66 mg/dL — AB (ref 70–99)
Glucose-Capillary: 95 mg/dL (ref 70–99)
Glucose-Capillary: 148 mg/dL — ABNORMAL HIGH (ref 70–99)
Glucose-Capillary: 123 mg/dL — ABNORMAL HIGH (ref 70–99)

## 2015-01-19 NOTE — Progress Notes (Signed)
Patient ID: [name], female   DOB: 01/09/1971, 44 y.o.   MRN: [id number] Pt without complaints Excited about tomorrow  VSSAF FHR 130s  Glc levels good control  Abd Gravid nt  IMPRESSION: IUP at 5236 w 6 days Diabetes Mellitus Cervical incompetence BPs improved  Plan as discussed yesterday, cerclage removal at 37 weeks with labor most likely after removal

## 2015-01-19 NOTE — Progress Notes (Deleted)
Patient left floor per wheelchair with s/o at side at 1600 until 1800.

## 2015-01-20 ENCOUNTER — Inpatient Hospital Stay (HOSPITAL_COMMUNITY): Admit: 2015-01-20 | Payer: BLUE CROSS/BLUE SHIELD

## 2015-01-20 ENCOUNTER — Encounter (HOSPITAL_COMMUNITY): Payer: Self-pay | Admitting: Anesthesiology

## 2015-01-20 ENCOUNTER — Encounter (HOSPITAL_COMMUNITY): Payer: Self-pay | Admitting: *Deleted

## 2015-01-20 ENCOUNTER — Inpatient Hospital Stay (HOSPITAL_COMMUNITY): Payer: BLUE CROSS/BLUE SHIELD | Admitting: Anesthesiology

## 2015-01-20 LAB — GLUCOSE, CAPILLARY
GLUCOSE-CAPILLARY: 71 mg/dL (ref 70–99)
GLUCOSE-CAPILLARY: 78 mg/dL (ref 70–99)
Glucose-Capillary: 66 mg/dL — ABNORMAL LOW (ref 70–99)
Glucose-Capillary: 149 mg/dL — ABNORMAL HIGH (ref 70–99)

## 2015-01-20 LAB — CBC
HEMATOCRIT: 36.1 % (ref 36.0–46.0)
Hemoglobin: 12.5 g/dL (ref 12.0–15.0)
MCH: 29.3 pg (ref 26.0–34.0)
MCHC: 34.6 g/dL (ref 30.0–36.0)
MCV: 84.5 fL (ref 78.0–100.0)
Platelets: 170 10*3/uL (ref 150–400)
RBC: 4.27 MIL/uL (ref 3.87–5.11)
RDW: 14.3 % (ref 11.5–15.5)
WBC: 9.8 10*3/uL (ref 4.0–10.5)

## 2015-01-20 LAB — TYPE AND SCREEN
ABO/RH(D): A POS
ANTIBODY SCREEN: NEGATIVE

## 2015-01-20 MED ORDER — MEDROXYPROGESTERONE ACETATE 150 MG/ML IM SUSP: 150.0000 mg | INTRAMUSCULAR | Status: DC | PRN

## 2015-01-20 MED ORDER — LIDOCAINE HCL (PF) 1 % IJ SOLN
30.0000 mL | INTRAMUSCULAR | Status: DC | PRN
Start: 2015-01-20 — End: 2015-01-20
  Filled 2015-01-20: qty 30

## 2015-01-20 MED ORDER — BENZOCAINE-MENTHOL 20-0.5 % EX AERO
1.0000 "application " | INHALATION_SPRAY | CUTANEOUS | Status: DC | PRN
  Administered 2015-01-21: 1 via TOPICAL
  Filled 2015-01-20: qty 56

## 2015-01-20 MED ORDER — ACETAMINOPHEN 325 MG PO TABS: 650.0000 mg | ORAL_TABLET | ORAL | Status: DC | PRN

## 2015-01-20 MED ORDER — TETANUS-DIPHTH-ACELL PERTUSSIS 5-2.5-18.5 LF-MCG/0.5 IM SUSP
0.5000 mL | Freq: Once | INTRAMUSCULAR | Status: AC
  Administered 2015-01-22: 0.5 mL via INTRAMUSCULAR

## 2015-01-20 MED ORDER — FAMOTIDINE 20 MG PO TABS: 40.0000 mg | ORAL_TABLET | Freq: Once | ORAL | Status: DC

## 2015-01-20 MED ORDER — PHENYLEPHRINE 40 MCG/ML (10ML) SYRINGE FOR IV PUSH (FOR BLOOD PRESSURE SUPPORT)
80.0000 ug | PREFILLED_SYRINGE | INTRAVENOUS | Status: DC | PRN
  Filled 2015-01-20: qty 2
  Filled 2015-01-20: qty 20

## 2015-01-20 MED ORDER — EPHEDRINE 5 MG/ML INJ
10.0000 mg | INTRAVENOUS | Status: DC | PRN
  Filled 2015-01-20: qty 2

## 2015-01-20 MED ORDER — DIPHENHYDRAMINE HCL 50 MG/ML IJ SOLN: 12.5000 mg | INTRAMUSCULAR | Status: DC | PRN

## 2015-01-20 MED ORDER — LACTATED RINGERS IV SOLN: 500.0000 mL | INTRAVENOUS | Status: DC | PRN

## 2015-01-20 MED ORDER — SENNOSIDES-DOCUSATE SODIUM 8.6-50 MG PO TABS
2.0000 | ORAL_TABLET | ORAL | Status: DC
  Administered 2015-01-21 – 2015-01-22 (×2): 2 via ORAL
  Filled 2015-01-20 (×2): qty 2

## 2015-01-20 MED ORDER — OXYTOCIN 40 UNITS IN LACTATED RINGERS INFUSION - SIMPLE MED: 1.0000 m[IU]/min | INTRAVENOUS | Status: DC

## 2015-01-20 MED ORDER — OXYCODONE-ACETAMINOPHEN 5-325 MG PO TABS: 2.0000 | ORAL_TABLET | ORAL | Status: DC | PRN

## 2015-01-20 MED ORDER — MEASLES, MUMPS & RUBELLA VAC ~~LOC~~ INJ: 0.5000 mL | INJECTION | Freq: Once | SUBCUTANEOUS | Status: DC

## 2015-01-20 MED ORDER — LACTATED RINGERS IV SOLN: INTRAVENOUS | Status: DC

## 2015-01-20 MED ORDER — PRENATAL MULTIVITAMIN CH
1.0000 | ORAL_TABLET | Freq: Every day | ORAL | Status: DC
  Administered 2015-01-21 – 2015-01-22 (×2): 1 via ORAL
  Filled 2015-01-20 (×2): qty 1

## 2015-01-20 MED ORDER — METFORMIN HCL 500 MG PO TABS
500.0000 mg | ORAL_TABLET | Freq: Two times a day (BID) | ORAL | Status: DC
  Administered 2015-01-20 – 2015-01-22 (×4): 500 mg via ORAL
  Filled 2015-01-20 (×6): qty 1

## 2015-01-20 MED ORDER — OXYCODONE-ACETAMINOPHEN 5-325 MG PO TABS: 1.0000 | ORAL_TABLET | ORAL | Status: DC | PRN

## 2015-01-20 MED ORDER — LIDOCAINE-EPINEPHRINE (PF) 2 %-1:200000 IJ SOLN
INTRAMUSCULAR | Status: DC | PRN
  Administered 2015-01-20: 3 mL

## 2015-01-20 MED ORDER — TERBUTALINE SULFATE 1 MG/ML IJ SOLN
0.2500 mg | Freq: Once | INTRAMUSCULAR | Status: DC | PRN
  Filled 2015-01-20: qty 1

## 2015-01-20 MED ORDER — IBUPROFEN 600 MG PO TABS
600.0000 mg | ORAL_TABLET | Freq: Four times a day (QID) | ORAL | Status: DC
  Administered 2015-01-20 – 2015-01-22 (×8): 600 mg via ORAL
  Filled 2015-01-20 (×8): qty 1

## 2015-01-20 MED ORDER — DIBUCAINE 1 % RE OINT
1.0000 | TOPICAL_OINTMENT | RECTAL | Status: DC | PRN
Start: 2015-01-20 — End: 2015-01-22

## 2015-01-20 MED ORDER — ONDANSETRON HCL 4 MG/2ML IJ SOLN: 4.0000 mg | Freq: Four times a day (QID) | INTRAMUSCULAR | Status: DC | PRN

## 2015-01-20 MED ORDER — OXYTOCIN BOLUS FROM INFUSION: 500.0000 mL | Freq: Once | INTRAVENOUS | Status: DC | PRN

## 2015-01-20 MED ORDER — SIMETHICONE 80 MG PO CHEW: 80.0000 mg | CHEWABLE_TABLET | ORAL | Status: DC | PRN

## 2015-01-20 MED ORDER — WITCH HAZEL-GLYCERIN EX PADS: 1.0000 "application " | MEDICATED_PAD | CUTANEOUS | Status: DC | PRN

## 2015-01-20 MED ORDER — OXYTOCIN 40 UNITS IN LACTATED RINGERS INFUSION - SIMPLE MED
62.5000 mL/h | Freq: Once | INTRAVENOUS | Status: AC | PRN
  Administered 2015-01-20: 62.5 mL/h via INTRAVENOUS
  Filled 2015-01-20: qty 1000

## 2015-01-20 MED ORDER — LANOLIN HYDROUS EX OINT: TOPICAL_OINTMENT | CUTANEOUS | Status: DC | PRN

## 2015-01-20 MED ORDER — ONDANSETRON HCL 4 MG/2ML IJ SOLN: 4.0000 mg | INTRAMUSCULAR | Status: DC | PRN

## 2015-01-20 MED ORDER — DEXTROSE IN LACTATED RINGERS 5 % IV SOLN: INTRAVENOUS | Status: DC

## 2015-01-20 MED ORDER — METOCLOPRAMIDE HCL 10 MG PO TABS: 10.0000 mg | ORAL_TABLET | Freq: Once | ORAL | Status: DC

## 2015-01-20 MED ORDER — FENTANYL 2.5 MCG/ML BUPIVACAINE 1/10 % EPIDURAL INFUSION (WH - ANES)
14.0000 mL/h | INTRAMUSCULAR | Status: DC | PRN
  Administered 2015-01-20: 12 mL/h via EPIDURAL
  Administered 2015-01-20: 14 mL/h via EPIDURAL
  Filled 2015-01-20: qty 125

## 2015-01-20 MED ORDER — ZOLPIDEM TARTRATE 5 MG PO TABS: 5.0000 mg | ORAL_TABLET | Freq: Every evening | ORAL | Status: DC | PRN

## 2015-01-20 MED ORDER — CITRIC ACID-SODIUM CITRATE 334-500 MG/5ML PO SOLN: 30.0000 mL | ORAL | Status: DC | PRN

## 2015-01-20 MED ORDER — BUPIVACAINE HCL (PF) 0.25 % IJ SOLN
INTRAMUSCULAR | Status: DC | PRN
  Administered 2015-01-20 (×2): 4 mL via EPIDURAL

## 2015-01-20 MED ORDER — LACTATED RINGERS IV SOLN
INTRAVENOUS | Status: DC
  Administered 2015-01-20 (×2): via INTRAVENOUS

## 2015-01-20 MED ORDER — DIPHENHYDRAMINE HCL 25 MG PO CAPS: 25.0000 mg | ORAL_CAPSULE | Freq: Four times a day (QID) | ORAL | Status: DC | PRN

## 2015-01-20 MED ORDER — ONDANSETRON HCL 4 MG PO TABS: 4.0000 mg | ORAL_TABLET | ORAL | Status: DC | PRN

## 2015-01-20 NOTE — Progress Notes (Signed)
Notified by H. Clovis RileyMitchell, [facility]

## 2015-01-20 NOTE — Progress Notes (Signed)
Cerclage removed successfully

## 2015-01-20 NOTE — Progress Notes (Signed)
Dr. Rana SnareLowe @ bedside attempting to remove cerclage.

## 2015-01-20 NOTE — Anesthesia Procedure Notes (Signed)
Epidural Patient location during procedure: OB  Staffing Anesthesiologist: Luccas Towell, CHRIS Performed by: anesthesiologist   Preanesthetic Checklist Completed: patient identified, surgical consent, pre-op evaluation, timeout performed, IV checked, risks and benefits discussed and monitors and equipment checked  Epidural Patient position: sitting Prep: site prepped and draped and DuraPrep Patient monitoring: heart rate, cardiac monitor, continuous pulse ox and blood pressure Approach: midline Location: L3-L4 Injection technique: LOR saline  Needle:  Needle type: Tuohy  Needle gauge: 17 G Needle length: 9 cm Needle insertion depth: 7 cm Catheter type: closed end flexible Catheter size: 19 Gauge Catheter at skin depth: 13 cm Test dose: negative and 2% lidocaine with Epi 1:200 K  Assessment Events: blood not aspirated, injection not painful, no injection resistance, negative IV test and no paresthesia  Additional Notes H+P and labs checked, risks and benefits discussed with the patient, consent obtained, procedure tolerated well and without complications.  Reason for block:procedure for pain   

## 2015-01-20 NOTE — Lactation Note (Addendum)
This note was copied from the chart of Boy Liberty HandyWendy Coronado. Lactation Consultation Note Initial visit at 6 hours of age.  Mom holding baby STS after feeding attempt with RN. Mom has wide spaced, hypoplastic breasts with little changes during pregnancy.  Mom reports not planning pregnacy with history of birthcontrol pills and rare periods before birthcontrol pills.  Mom is type 2 diabetic with insulin needs during pregnancy.  Several minutes of hand expression yields one small drops placed on baby's lips.  Attempted football and cross cradle hold with baby. Baby sucked a few times, but has difficulty holding breast in mouth and is sleepy.  Set up DEBP with instructions on use, cleaning and storage.  Mom is encouraged to pump every 3 hours for 15-20 minutes and then hand express to collect colostrum for baby.  LPT infant green communication sheet shared with parents.  Discussed need for supplementation due to baby not latching well and no colostrum collected at this time.  RN to assist with syring feeding and teaching for parents.  [facility] given and discussed.  Encouraged to feed with early cues on demand.  Early newborn behavior discussed.  Hand expression demonstrated by mom with drop of colostrum visible.  Mom to call for assist as needed.    Patient Name: Boy Liberty HandyWendy Snook ZOXWR'UToday's Date: 01/20/2015 Reason for consult: Initial assessment;Infant < 6lbs   Maternal Data Has patient been taught Hand Expression?: Yes Does the patient have breastfeeding experience prior to this delivery?: No  Feeding Feeding Type: Breast Fed Length of feed: 0 min  LATCH Score/Interventions Latch: Too sleepy or reluctant, no latch achieved, no sucking elicited. (few sucks poor latch) Intervention(s): Skin to skin;Teach feeding cues;Waking techniques Intervention(s): Adjust position;Assist with latch;Breast massage;Breast compression  Audible Swallowing: None Intervention(s): Skin to skin;Hand expression  Type  of Nipple: Flat Intervention(s): Hand pump;Double electric pump  Comfort (Breast/Nipple): Soft / non-tender     Hold (Positioning): Assistance needed to correctly position infant at breast and maintain latch. Intervention(s): Breastfeeding basics reviewed;Support Pillows;Position options;Skin to skin  LATCH Score: 4  Lactation Tools Discussed/Used Pump Review: Setup, frequency, and cleaning;Milk Storage Initiated by:: JS Date initiated:: 01/20/15   Consult Status Consult Status: Follow-up Date: 01/21/15 Follow-up type: In-patient    Jannifer RodneyShoptaw, Jana Lynn 01/20/2015, 9:23 PM

## 2015-01-20 NOTE — Progress Notes (Signed)
Mushroom VE applied by Dr. Rana SnareLowe.

## 2015-01-20 NOTE — Progress Notes (Signed)
Patient ID: [name], female   DOB: 04/21/1971, 44 y.o.   MRN: [id number] Per previous plan pt was sent to L&D for cerclage removal Cerclage x 2 removed complete and cervix now 5/c/-1 with ctxs q 3-5 minutes So, AROM performed due to active labor Anticipate SVD. Glucose monitoring and treatment per diabetic labor protocol.  Pt desires epidural DL

## 2015-01-20 NOTE — Progress Notes (Signed)
Pt beginning to push with ctxs 

## 2015-01-20 NOTE — Anesthesia Preprocedure Evaluation (Signed)
Anesthesia Evaluation  Patient identified by MRN, date of birth, ID band Patient awake    Reviewed: Allergy & Precautions, NPO status , Patient's Chart, lab work & pertinent test results  History of Anesthesia Complications Negative for: history of anesthetic complications  Airway Mallampati: II  TM Distance: >3 FB Neck ROM: Full    Dental  (+) Teeth Intact   Pulmonary neg pulmonary ROS,  breath sounds clear to auscultation        Cardiovascular negative cardio ROS  Rhythm:Regular     Neuro/Psych negative neurological ROS  negative psych ROS   GI/Hepatic negative GI ROS, Neg liver ROS,   Endo/Other  diabetes, Type 2  Renal/GU negative Renal ROS     Musculoskeletal   Abdominal   Peds  Hematology negative hematology ROS (+)   Anesthesia Other Findings   Reproductive/Obstetrics (+) Pregnancy                             Anesthesia Physical Anesthesia Plan  ASA: II  Anesthesia Plan: Epidural   Post-op Pain Management:    Induction:   Airway Management Planned:   Additional Equipment:   Intra-op Plan:   Post-operative Plan:   Informed Consent: I have reviewed the patients History and Physical, chart, labs and discussed the procedure including the risks, benefits and alternatives for the proposed anesthesia with the patient or authorized representative who has indicated his/her understanding and acceptance.     Plan Discussed with: Anesthesiologist  Anesthesia Plan Comments:         Anesthesia Quick Evaluation

## 2015-01-21 ENCOUNTER — Encounter (HOSPITAL_COMMUNITY)
Admission: AD | Disposition: A | Discharge status: Home / Self Care | Payer: Self-pay | Source: Ambulatory Visit | Attending: Obstetrics and Gynecology

## 2015-01-21 LAB — RPR: RPR Ser Ql: NONREACTIVE

## 2015-01-21 LAB — CBC
HCT: 30.5 % — ABNORMAL LOW (ref 36.0–46.0)
Hemoglobin: 10.2 g/dL — ABNORMAL LOW (ref 12.0–15.0)
MCH: 28.4 pg (ref 26.0–34.0)
MCHC: 33.4 g/dL (ref 30.0–36.0)
MCV: 85 fL (ref 78.0–100.0)
Platelets: 150 10*3/uL (ref 150–400)
RBC: 3.59 MIL/uL — AB (ref 3.87–5.11)
RDW: 14.3 % (ref 11.5–15.5)
WBC: 11.7 10*3/uL — ABNORMAL HIGH (ref 4.0–10.5)

## 2015-01-21 LAB — GLUCOSE, CAPILLARY
Glucose-Capillary: 86 mg/dL (ref 70–99)
Glucose-Capillary: 141 mg/dL — ABNORMAL HIGH (ref 70–99)
Glucose-Capillary: 102 mg/dL — ABNORMAL HIGH (ref 70–99)
Glucose-Capillary: 155 mg/dL — ABNORMAL HIGH (ref 70–99)

## 2015-01-21 SURGERY — LIGATION, FALLOPIAN TUBE, POSTPARTUM
Anesthesia: Choice | Laterality: Bilateral

## 2015-01-21 NOTE — Lactation Note (Signed)
This note was copied from the chart of Boy Liberty HandyWendy Belnap. Lactation Consultation Note  Patient Name: Boy Liberty HandyWendy Olarte ZOXWR'UToday's Date: 01/21/2015 Reason for consult: Follow-up assessment    With this mom of a [redacted] week gestation baby, weighing less than 6 pounds, 18 hours old and just back from circumcision. I was asked to see mom to assist with latching. The baby was rooting, but once latched, both wityh and without nipple shield, baby asleep. He was breast fed and supplemented with 10 ml's of formula 2 hours ago. I left the baby skin to skin with mom. Mom knows to call for questions/concerns.    Maternal Data    Feeding Feeding Type: Breast Fed Length of feed: 20 min  LATCH Score/Interventions Latch: Too sleepy or reluctant, no latch achieved, no sucking elicited. Intervention(s): Skin to skin;Teach feeding cues Intervention(s): Assist with latch;Adjust position  Audible Swallowing: A few with stimulation Intervention(s): Skin to skin  Type of Nipple: Everted at rest and after stimulation  Comfort (Breast/Nipple): Soft / non-tender     Hold (Positioning): Assistance needed to correctly position infant at breast and maintain latch. Intervention(s): Breastfeeding basics reviewed;Support Pillows;Position options;Skin to skin  LATCH Score: 7  Lactation Tools Discussed/Used Tools: Nipple Shields Nipple shield size: 24   Consult Status Consult Status: Follow-up Date: 01/21/15 Follow-up type: In-patient    Alfred LevinsLee, Rajendra Spiller Anne 01/21/2015, 9:37 AM

## 2015-01-21 NOTE — Plan of Care (Signed)
Problem: Phase II Progression Outcomes Goal: Progress activity as tolerated unless otherwise ordered Outcome: Completed/Met Date Met:  01/21/15 Attended baby /mother care class

## 2015-01-21 NOTE — Anesthesia Postprocedure Evaluation (Signed)
Anesthesia Post Note  Patient: [name]  Procedure(s) Performed: * No procedures listed *  Anesthesia type: Epidural  Patient location: Mother/Baby  Post pain: Pain level controlled  Post assessment: Post-op Vital signs reviewed  Last Vitals:  Filed Vitals:   01/21/15 0551  BP: 111/71  Pulse: 93  Temp: 36.5 C  Resp: 18    Post vital signs: Reviewed  Level of consciousness:alert  Complications: No apparent anesthesia complications

## 2015-01-21 NOTE — Progress Notes (Signed)
Attended 'Well After Birth' group class which presented discharge care information for both Mom and Baby. 

## 2015-01-21 NOTE — Lactation Note (Signed)
This note was copied from the chart of Gina Liberty HandyWendy Schlitt. Lactation Consultation Note  Patient Name: [name] ONGEX'BToday's Date: 01/21/2015 Reason for consult: Follow-up assessment with this mom of a 37 2/7 weeks CGA, weighing under 6 pounds, and 25 hours old. Mom is very tiered, has been brest feeding with nipple shiled and curved tip syringe to add formula, but has decided at this time to botle feed and pump. vised to pump at least 5 -8 times a day, and sleep at least 5 hours at night.  I explained to mom that due to 2 1/2 months of bedrest and diabetes, her milk can take days longer to come in. Mom pleased with her new plan, wants her baby to get breast milk, and is aware that lactation is available to her and baby after discharge, as needed, if she wants to transition the baby back to breast at some time.    Maternal Data    Feeding Feeding Type: Formula  LATCH Score/Interventions Latch: Too sleepy or reluctant, no latch achieved, no sucking elicited.  Audible Swallowing: None  Type of Nipple: Everted at rest and after stimulation  Comfort (Breast/Nipple): Soft / non-tender (soft, flat, pendulous breasts)     Hold (Positioning): Assistance needed to correctly position infant at breast and maintain latch. Intervention(s): Breastfeeding basics reviewed;Support Pillows;Position options;Skin to skin  LATCH Score: 5  Lactation Tools Discussed/Used Breast pump type: Double-Electric Breast Pump Pump Review: Setup, frequency, and cleaning;Milk Storage;Other (comment) (premie setting, hand expression)   Consult Status Consult Status: Follow-up Date: 01/22/15 Follow-up type: In-patient    Alfred LevinsLee, Akshay Spang Anne 01/21/2015, 4:01 PM

## 2015-01-21 NOTE — Lactation Note (Signed)
This note was copied from the chart of [name]. Lactation Consultation Note Mom having difficulty latching baby. Baby hungry but doesn't have good suck swallow coordination w/gloved finger. Moms nipples semi flat at the end of long pendulum soft breast, w/wide space between breast.  Breast massage and hand expression got a drop out of Rt. Nipple after a white plug removed from center of inverted center of nipple.  #20NS fitted and applied to nipple. #24 probable fits better, slightly large but to large for baby's mouth. NS pre-filled w/formula encouraged mom to elevate breast w/cloth under breast to assist in firming them up some, then use "C" hold for latching abby. Baby latched well and suckled great. When formula out, baby stopped suckling. Added more total of 10ml. Baby has bruised appearance to face. Discussed jaundice and importance of BF/supplementing/and output. Mom inexperienced and baby was a surprize. Has been on bedrest and hasn't been able to take any classes or get pre-pared. Discussed baby being SGA and importance of I&O. I changed a wet diaper. Discussed supplementing and breast feeding. Encouraged post-pumping to stimulate breast. I'm concerned mom has PCOS and doesn't know it. 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 Patient Name: [name] ZOXWR'UToday's Date: 01/21/2015 Reason for consult: Difficult latch;Infant < 6lbs   Maternal Data    Feeding Feeding Type: Formula Length of feed: 10 min  LATCH Score/Interventions Latch: Grasps breast easily, tongue down, lips flanged, rhythmical sucking. Intervention(s): Skin to skin;Teach feeding cues;Waking techniques Intervention(s): Adjust position;Assist with latch;Breast massage;Breast compression  Audible Swallowing: None Intervention(s): Skin to skin;Hand expression  Type of Nipple: Flat Intervention(s): Double electric pump;Shells  Comfort (Breast/Nipple): Soft / non-tender      Hold (Positioning): Assistance needed to correctly position infant at breast and maintain latch. Intervention(s): Skin to skin;Position options;Support Pillows;Breastfeeding basics reviewed  LATCH Score: 6  Lactation Tools Discussed/Used Tools: Nipple Dorris CarnesShields;Shells;Pump Nipple shield size: 20;24 Shell Type: Inverted Breast pump type: Double-Electric Breast Pump   Consult Status Consult Status: Follow-up Date: 01/21/15 (in pm) Follow-up type: In-patient    Lilly Gasser, Diamond NickelLAURA G 01/21/2015, 6:09 AM

## 2015-01-21 NOTE — Progress Notes (Signed)
Post Partum Day 1 Subjective: no complaints, up ad lib, voiding, tolerating PO and + flatus  Objective: Blood pressure 111/71, pulse 93, temperature 97.7 F (36.5 C), temperature source Oral, resp. rate 18, height 5\' 5"  (1.651 m), weight 184 lb 9.6 oz (83.734 kg), SpO2 99 %, unknown if currently breastfeeding.  Physical Exam:  General: alert and cooperative Lochia: appropriate Uterine Fundus: firm Incision: healing well DVT Evaluation: No evidence of DVT seen on physical exam. Negative Homan's sign. No cords or calf tenderness. No significant calf/ankle edema.   Recent Labs  01/20/15 1010 01/21/15 0550  HGB 12.5 10.2*  HCT 36.1 30.5*    Assessment/Plan: Plan for discharge tomorrow   LOS: 5 days   [name],[name] 01/21/2015, 8:39 AM

## 2015-01-22 LAB — GLUCOSE, CAPILLARY: GLUCOSE-CAPILLARY: 71 mg/dL (ref 70–99)

## 2015-01-22 MED ORDER — IBUPROFEN 600 MG PO TABS: 600.0000 mg | ORAL_TABLET | Freq: Four times a day (QID) | ORAL | Status: DC

## 2015-01-22 MED ORDER — PRENATAL MULTIVITAMIN CH: 1.0000 | ORAL_TABLET | Freq: Every day | ORAL | Status: DC

## 2015-01-22 MED ORDER — METFORMIN HCL 500 MG PO TABS: 500.0000 mg | ORAL_TABLET | Freq: Three times a day (TID) | ORAL | Status: DC

## 2015-01-22 NOTE — Progress Notes (Signed)
Patient checked hr own 2 hour post lunch CBG at 1200 with the result of 110.

## 2015-01-22 NOTE — Discharge Summary (Signed)
Obstetric Discharge Summary Reason for Admission: onset of labor Prenatal Procedures: ultrasound Intrapartum Procedures: vacuum Postpartum Procedures: none Complications-Operative and Postpartum: vaginal laceration HEMOGLOBIN  Date Value Ref Range Status  01/21/2015 10.2* 12.0 - 15.0 g/dL Final   HCT  Date Value Ref Range Status  01/21/2015 30.5* 36.0 - 46.0 % Final    Physical Exam:  General: alert and cooperative Lochia: appropriate Uterine Fundus: firm Incision: healing well DVT Evaluation: No evidence of DVT seen on physical exam. Negative Homan's sign. No cords or calf tenderness. No significant calf/ankle edema.  Discharge Diagnoses: incompetent cervix with cerclage removal, s/p VE vag delivery at 37 weeks  Discharge Information: Date: 01/22/2015 Activity: pelvic rest Diet: low carb diet Medications: PNV, Ibuprofen and Metformin Condition: stable Instructions: refer to practice specific booklet Discharge to: home   Newborn Data: Live born female  Birth Weight: 5 lb 15.4 oz (2705 g) APGAR: 8, 9   baby under phototherapy, patient baby with probable discharge in am.  CURTIS,CAROL G 01/22/2015, 8:56 AM

## 2015-01-22 NOTE — Lactation Note (Signed)
This note was copied from the chart of [name] Jacquez. Lactation Consultation Note Mom is bottle feeding now. She isn't going to put the baby to the breast. Doesn't like the nNS. Mom will pump and bottle feed when her milk comes in. Mom pumped yesterday. Hasn't pumped today. States she is exhausted and is going to rest. Will pump later. Stated she knows she should pump for stimulation.  Baby on DPT.  Patient Name: [name] Modesitt WUJWJ'XToday's Date: 01/22/2015 Reason for consult: Follow-up assessment   Maternal Data    Feeding Feeding Type: Bottle Fed - Formula  LATCH Score/Interventions       Intervention(s): Double electric pump              Lactation Tools Discussed/Used Tools: Pump Breast pump type: Double-Electric Breast Pump   Consult Status Consult Status: PRN Date: 01/23/15 Follow-up type: In-patient    Charyl DancerCARVER, Maddix Heinz G 01/22/2015, 12:14 PM

## 2015-01-22 NOTE — Progress Notes (Signed)
On Am rounds Dr Lynnette Caffey Informed that patient declines MMR and pneumonia vaccine.

## 2015-09-23 LAB — HEMOGLOBIN A1C: Hemoglobin A1C: 9

## 2015-10-24 IMAGING — US US MFM OB TRANSVAGINAL
1 series · 13 of 28 positions shown · non-contrast
Comparison: none

OBSTETRICS REPORT
                      (Signed Final 10/31/2014 [DATE])

Service(s) Provided
 US MFM OB TRANSVAGINAL                                76817.2
Indications
 Cervical incompetence, second trimester
 Advanced maternal age primigravida 35+, second
 trimester
 Pre-existing diabetes, type 2, in pregnancy, second
 Cervical Cerclage
 25 weeks gestation of pregnancy
Fetal Evaluation
 Num Of Fetuses:    1
 Fetal Heart Rate:  152                          bpm
 Cardiac Activity:  Observed
 Presentation:      Cephalic
 Placenta:          Posterior, above cervical
                    os
 P. Cord            Visualized
 Insertion:
 Amniotic Fluid
 AFI FV:      Subjectively within normal limits
Gestational Age
 Best:          25w 3d     Det. By:  Early Ultrasound         EDD:   02/10/15
Cervix Uterus Adnexa
 Cervix:       Measured transvaginally.
Impression
INDICATION: 43 yr old G1P0 at 00w8d with type II diabetes and
 cervical insufficiency s/p ultrasound indicated cerclage for
 cervical length.

[Series 1: us mfm ob transvaginal · 0.26mm/px · 13 of 34 slices shown]
[im 2/34]
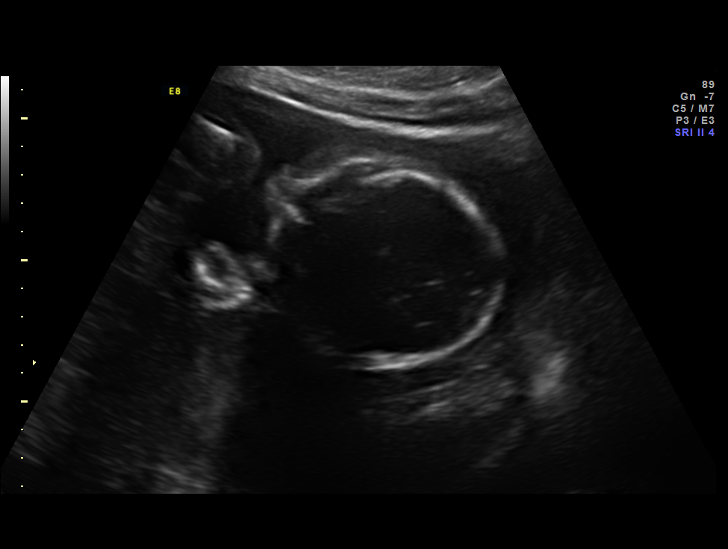
[im 4/34]
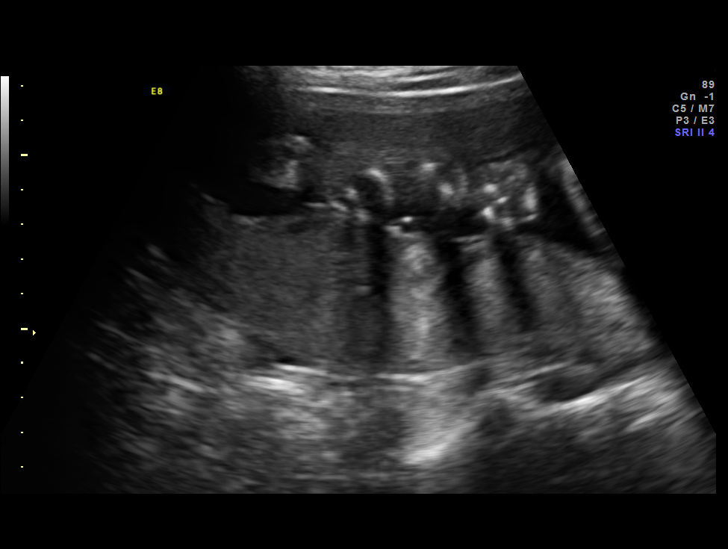
[im 7/34]
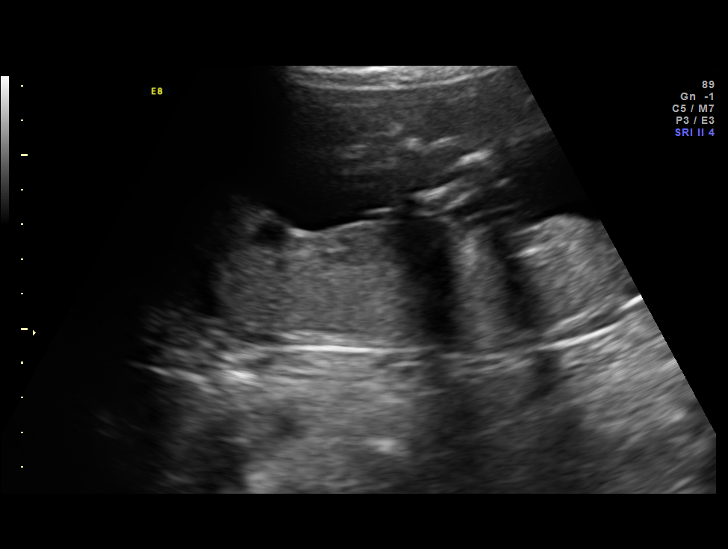
[im 9/34]
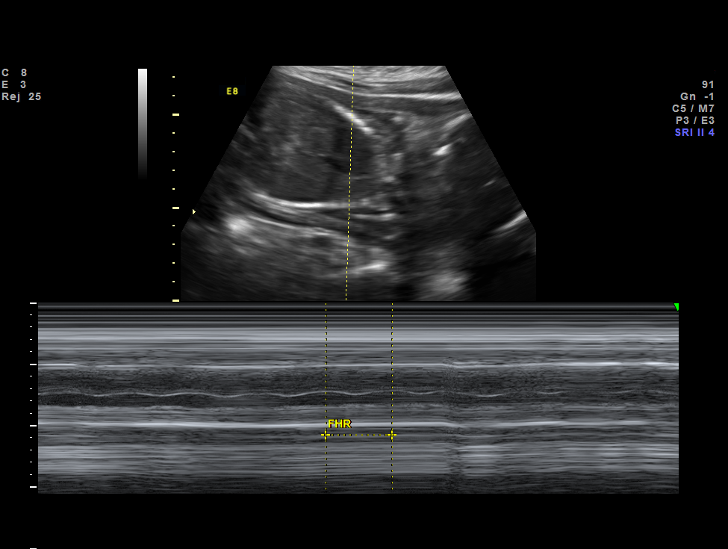
[im 12/34]
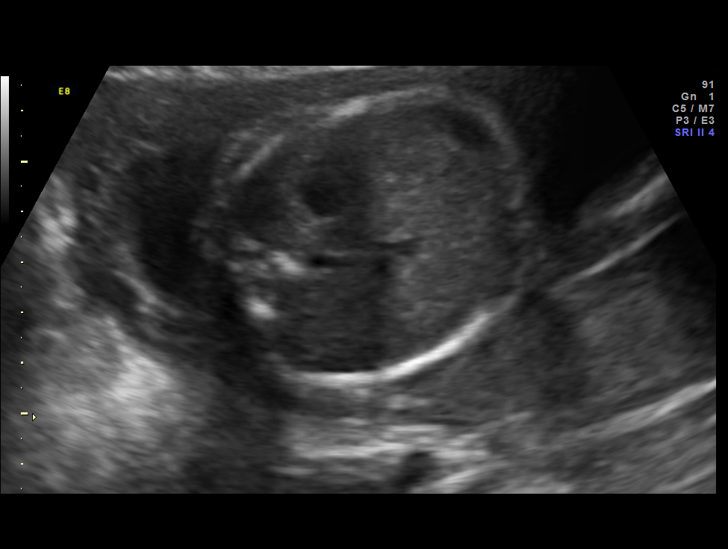
[im 14/34]
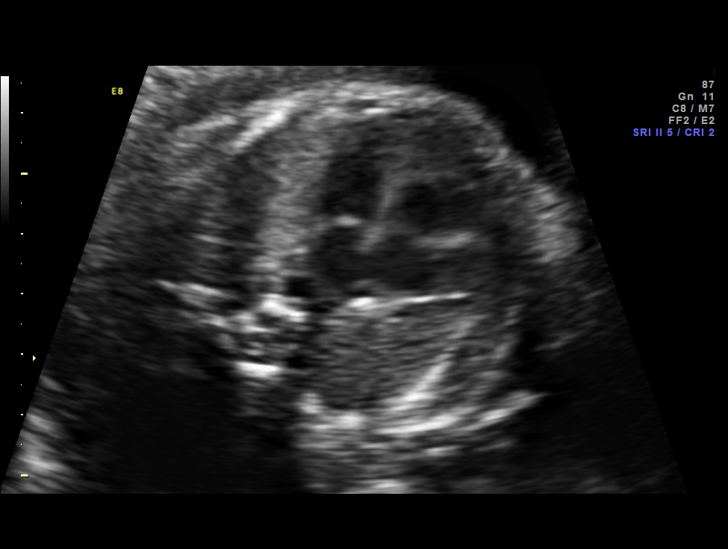
[im 18/34]
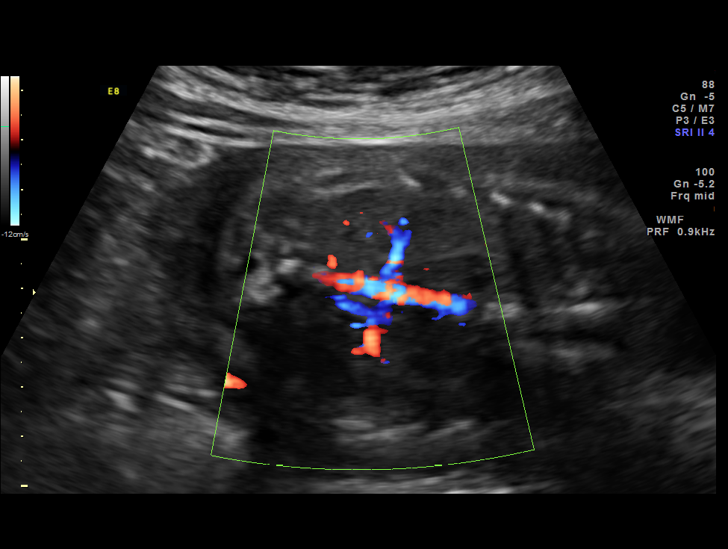
[im 20/34]
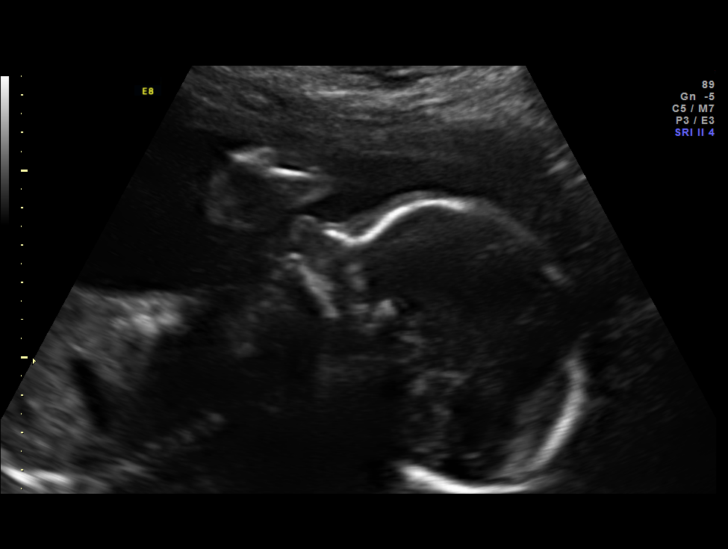
[im 23/34]
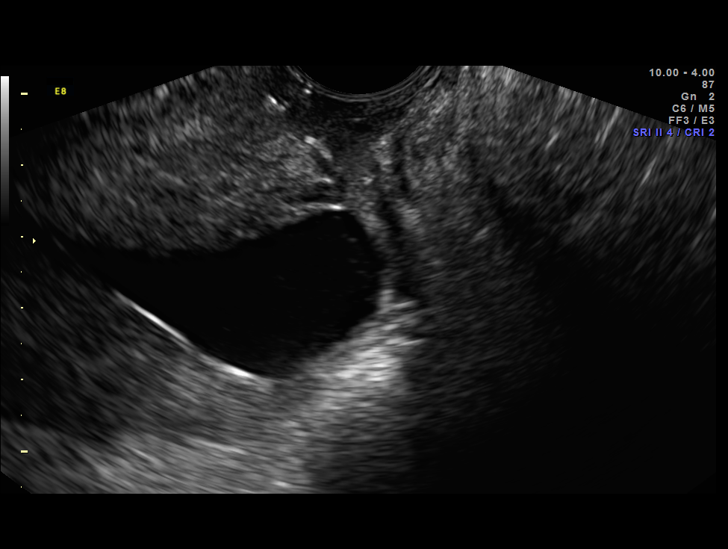
[im 25/34]
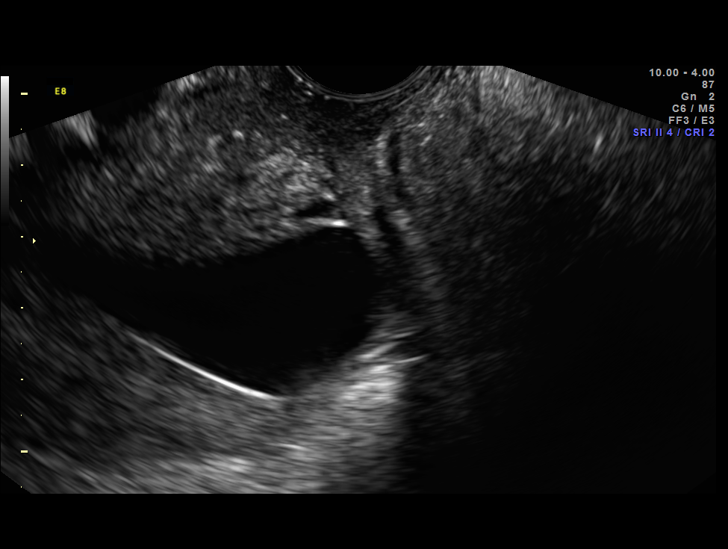
[im 27/34]
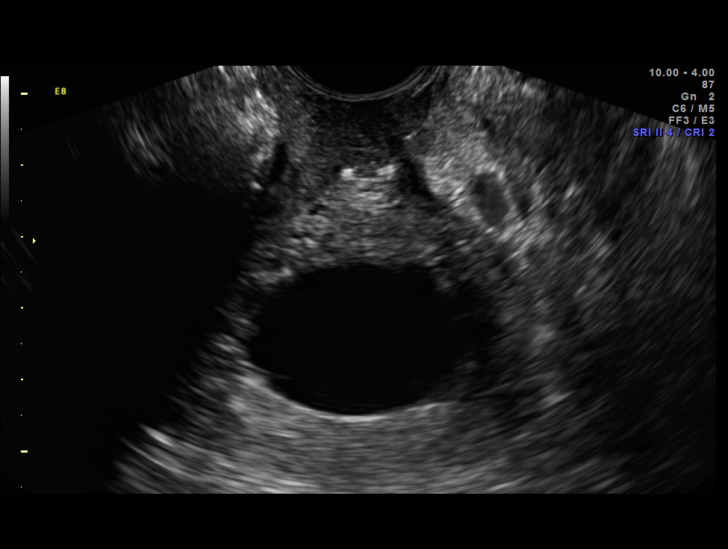
[im 30/34]
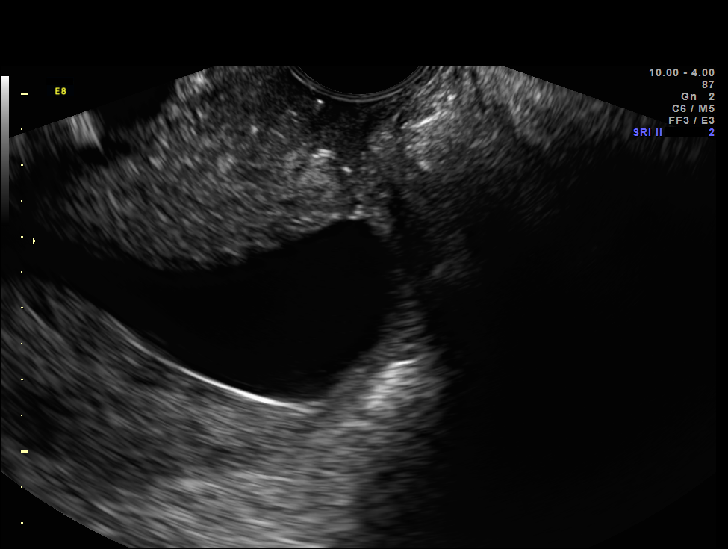
[im 32/34]
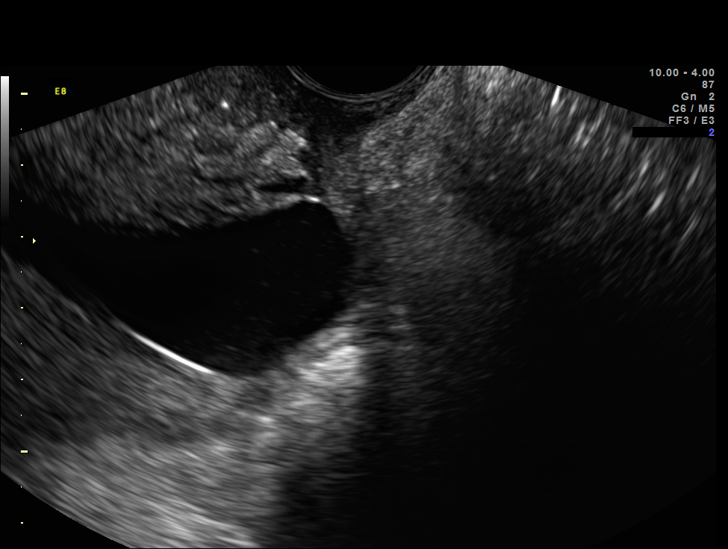

[13 of 28 positions shown; findings below may reference images not displayed]

FINDINGS: 1. Single intrauterine pregnancy.
 2. Posterior placenta without evidence of previa.
 3. Normal amniotic fluid volume.
 4. There is no measurable cervix transvaginally; the
 membranes appeared funneled past the cerclage.
Recommendations

 1. Cervical insufficiency:
 - previously counseled; reiterated increased risk of PPROM,
 preterm delivery, preterm labor
 - has cerclage and is on vaginal progesterone (recommend
 continue)
 - given change in ultrasound exam recommend speculum
 exam to evaluate for dilation and integrity of cerclage
 - recommend admission for [place]alcazar discussed
 benefits in reducing morbidity and mortality associated with
 prematurity
 - would recommend monitoring at least a week and then can
 reassess cervical exam to determine if can be managed as
 outpatient
 - do not feel any further transvaginal cervical lengths would
 be helpful; would monitor via speculum and digital exam
 - if delivery felt to be imminent prior to 32 weeks recommend
 magnesium sulfate for fetal neuroprotection
 2. Advanced maternal age:
 - previously counseled
 - had low risk cell free fetal DNA
 - recommend serial fetal growth
 3. Diabetes:
 - previously counseled
 - normal fetal echocardiogram
 - recommend fetal growth every 4 weeks; due in 2 weeks
 - recommend antenatal testing starting at 32 weeks
 - recommend close surveillance of blood sugars (expected to
 increase after Ingert)

 Discussed with Dr. Stupers; patient sent to [facility]

 questions or concerns.

## 2015-12-04 IMAGING — US US OB FOLLOW-UP
1 series · 12 of 28 positions shown · non-contrast
Comparison: none

[Series 1: us ob follow up · 45 acquisitions, 12 frames shown]
[im 2/45]
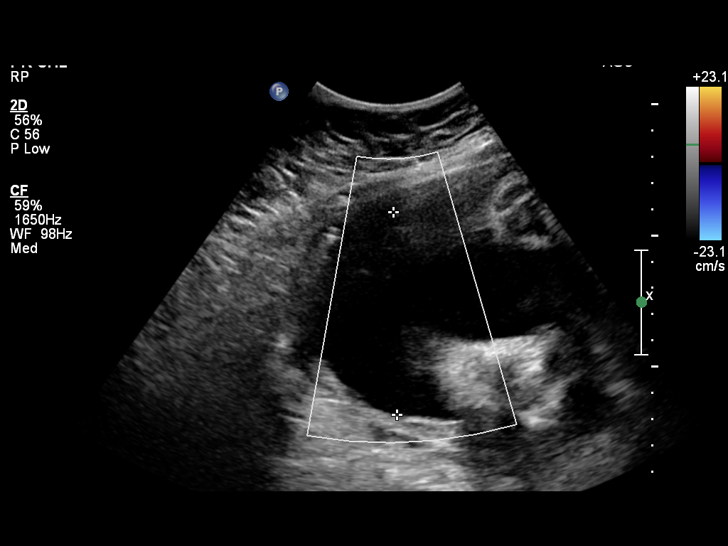
[im 5/45]
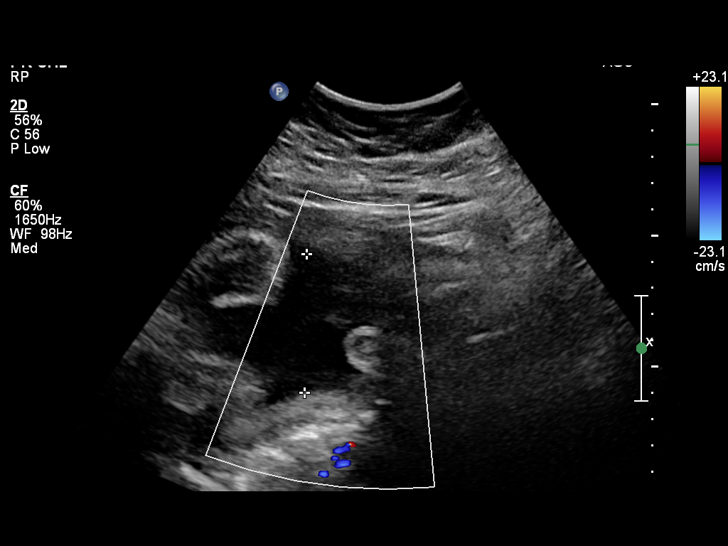
[im 9/45]
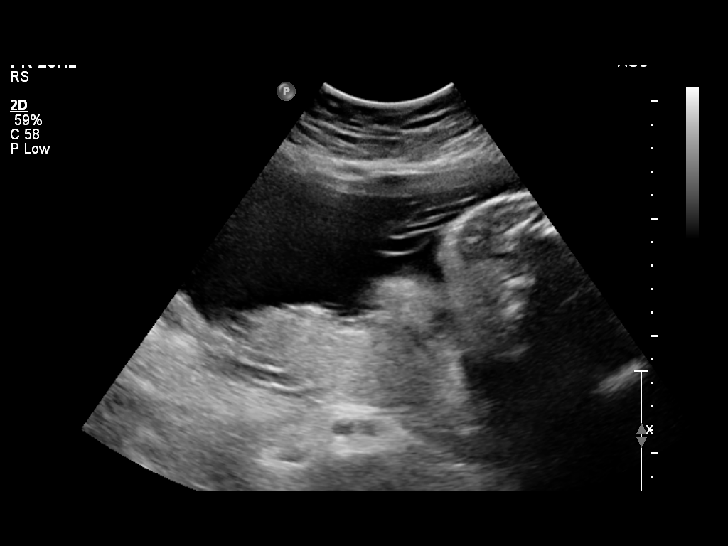
[im 14/45]
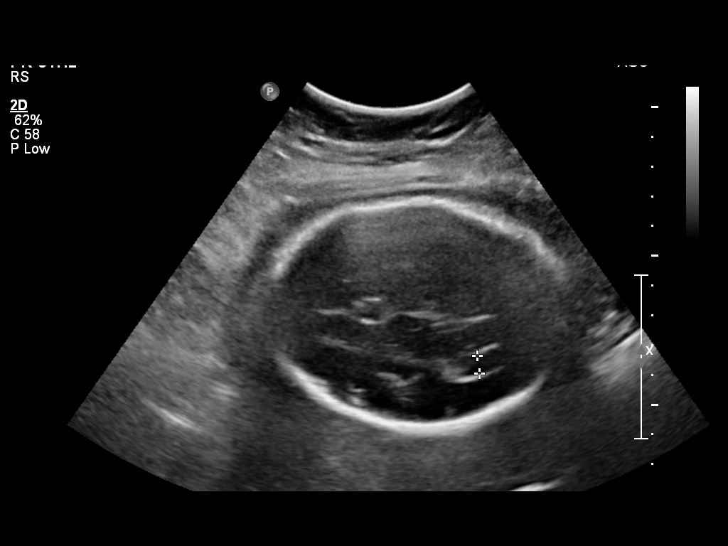
[im 17/45]
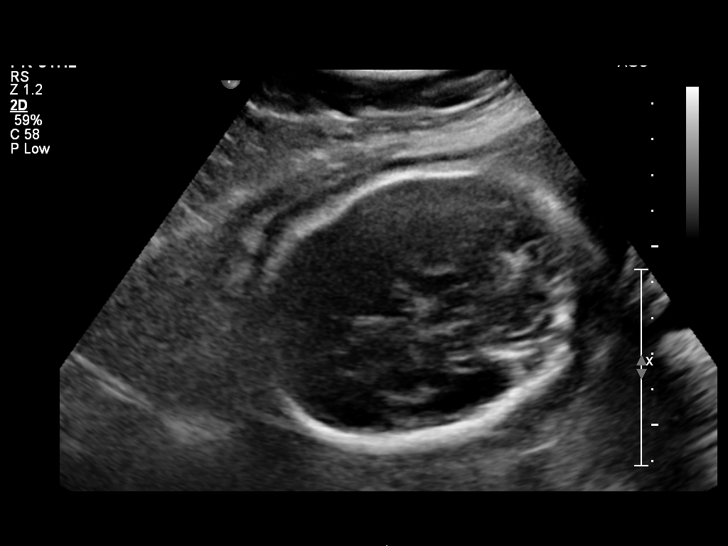
[im 20/45]
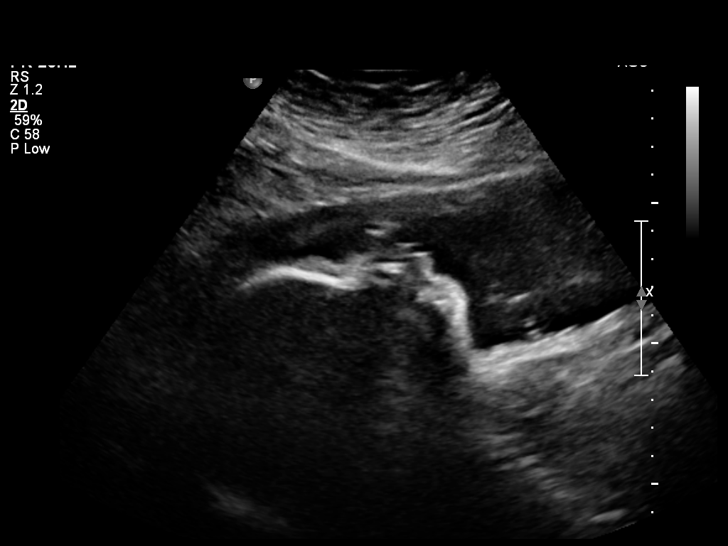
[im 25/45]
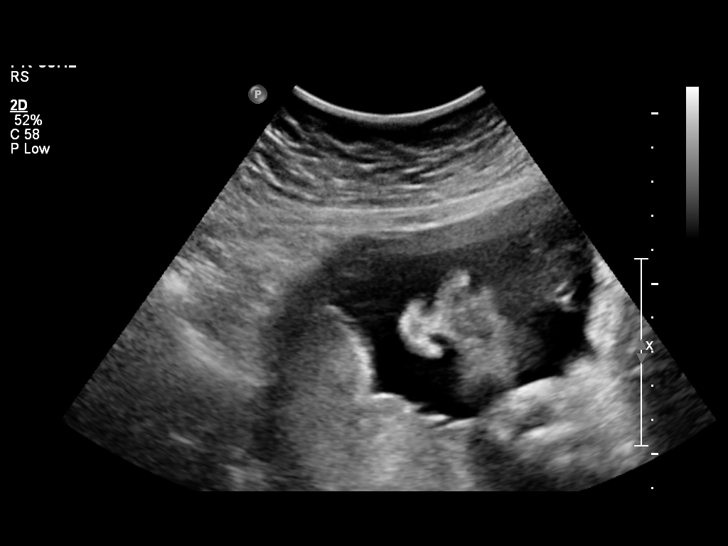
[im 28/45]
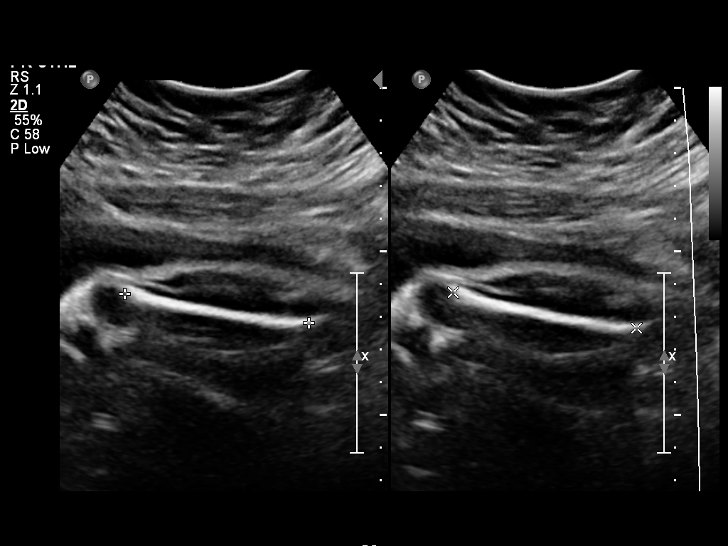
[im 31/45]
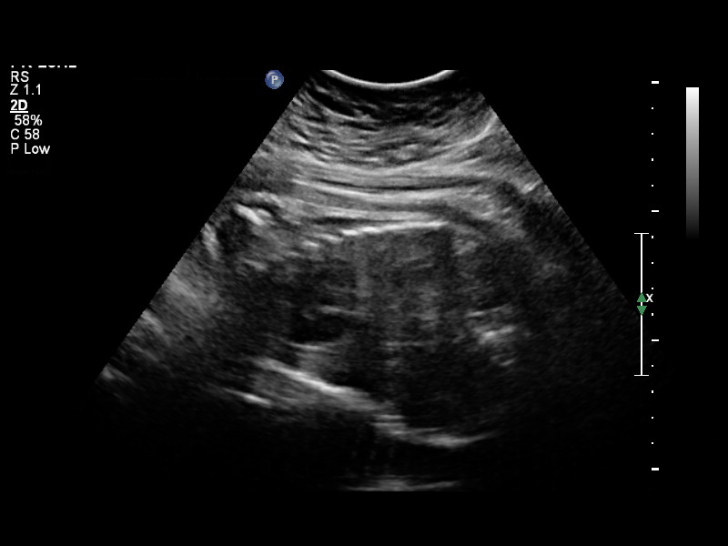
[im 36/45]
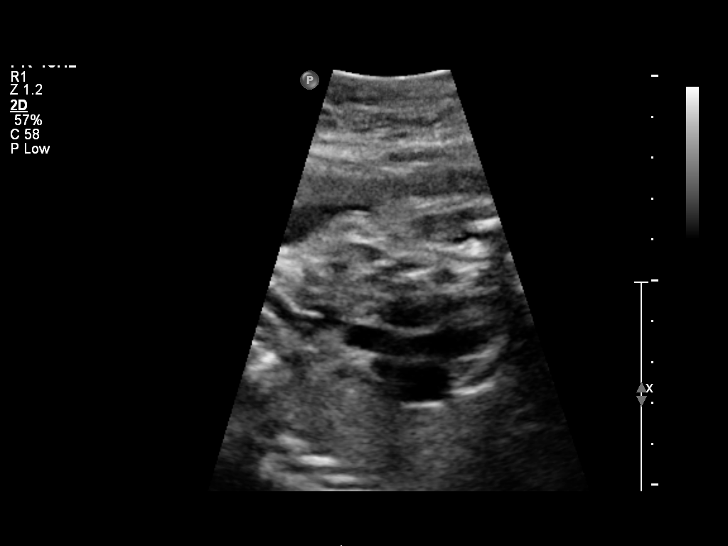
[im 40/45]
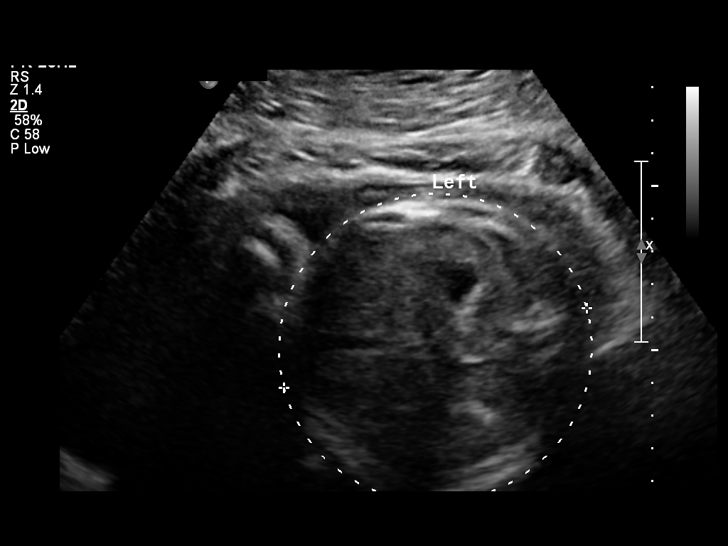
[im 43/45]
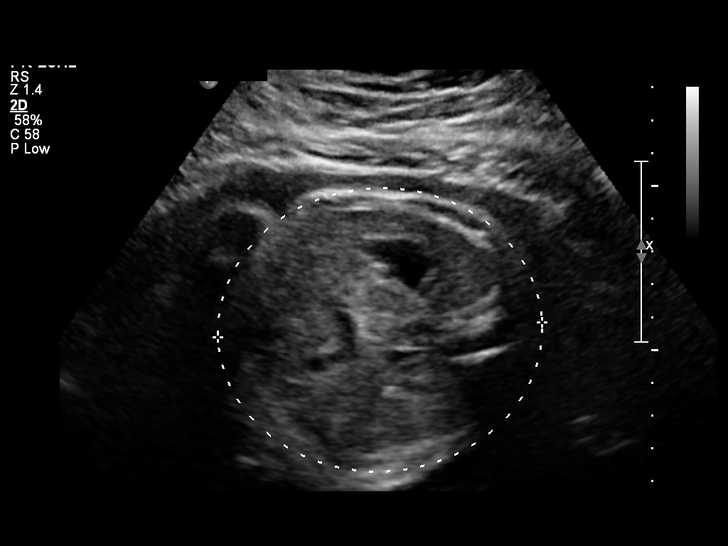

[12 of 28 positions shown; findings below may reference images not displayed]

OBSTETRICS REPORT
                      (Signed Final 12/11/2014 [DATE])

Service(s) Provided

 US OB FOLLOW UP                                       76816.1
Indications

 Cervical incompetence, second trimester
 Advanced maternal age primigravida  (43) second
 trimester; low risk NIPS
 Pre-existing diabetes, type 2, in pregnancy, second
 trimester; Meformin and insulin
 Cervical Cerclage
 31 weeks gestation of pregnancy
Fetal Evaluation

 Num Of Fetuses:    1
 Fetal Heart Rate:  144                          bpm
 Cardiac Activity:  Observed
 Presentation:      Frank breech
 Placenta:          Posterior, above cervical
                    os
 P. Cord            Previously Visualized
 Insertion:

 Amniotic Fluid
 AFI FV:      Subjectively within normal limits
 AFI Sum:     17.89   cm       67  %Tile     Larg Pckt:    7.69  cm
 RUQ:   7.69    cm   RLQ:    5.26   cm    LLQ:   4.94    cm
Biometry

 BPD:     73.8  mm     G. Age:  29w 4d                CI:        65.23   70 - 86
                                                      FL/HC:      19.4   19.3 -

 HC:     293.5  mm     G. Age:  32w 3d       44  %    HC/AC:      1.02   0.96 -

 AC:       288  mm     G. Age:  32w 6d       86  %    FL/BPD:     77.0   71 - 87
 FL:      56.8  mm     G. Age:  29w 6d        8  %    FL/AC:      19.7   20 - 24
 HUM:     48.6  mm     G. Age:  28w 4d      < 5  %

 Est. FW:    8788  gm    3 lb 15 oz      61  %
Gestational Age

 U/S Today:     31w 1d                                        EDD:   02/11/15
 Best:          31w 2d     Det. By:  Early Ultrasound         EDD:   02/10/15
Anatomy

 Cranium:          Appears normal         Aortic Arch:      Previously seen
 Fetal Cavum:      Appears normal         Ductal Arch:      Not well visualized
 Ventricles:       Appears normal         Diaphragm:        Previously seen
 Choroid Plexus:   Previously seen        Stomach:          Appears normal, left
                                                            sided
 Cerebellum:       Appears normal         Abdomen:          Previously seen
 Posterior Fossa:  Appears normal         Abdominal Wall:   Previously seen
 Nuchal Fold:      Not applicable (>20    Cord Vessels:     Appears normal (3
                   wks GA)                                  vessel cord)
 Face:             Appears normal         Kidneys:          Appear normal
                   (orbits and profile)
 Lips:             Appears normal         Bladder:          Appears normal
 Heart:            Appears normal         Spine:            Previously seen
                   (4CH, axis, and
                   situs)
 RVOT:             Appears normal         Lower             Previously seen
                                          Extremities:
 LVOT:             Previously seen        Upper             Previously seen
                                          Extremities:

 Other:  Male gender. Heels and 5th digit previously seen.
Cervix Uterus Adnexa

 Cervix:       Not visualized (advanced GA >69wks)
 Uterus:       No abnormality visualized.
 Cul De Sac:   No free fluid seen.

 Left Ovary:    Not visualized.
 Right Ovary:   Not visualized.
 Adnexa:     No abnormality visualized.
Impression

 SIUP at 31+2 weeks
 Normal interval anatomy; anatomic survey complete except
 for DA
 Normal amniotic fluid volume
 Appropriate interval growth with EFW at the 61st %tile
Recommendations

 Follow-up as clinically indicated
 Antenatal testing at 32 weeks

 questions or concerns.

## 2017-05-25 LAB — LIPID PANEL
CHOLESTEROL: 135 (ref 0–200)
HDL: 49 (ref 35–70)
LDL Cholesterol: 67
Triglycerides: 106 (ref 40–160)

## 2017-05-25 LAB — HEMOGLOBIN A1C: HEMOGLOBIN A1C: 11.9

## 2017-05-25 LAB — TSH: TSH: 1.1 (ref <=5.90)

## 2017-05-25 LAB — BASIC METABOLIC PANEL
BUN: 18 (ref 4–21)
Creatinine: 0.8 (ref <=1.1)

## 2017-06-23 ENCOUNTER — Ambulatory Visit (INDEPENDENT_AMBULATORY_CARE_PROVIDER_SITE_OTHER): Payer: BLUE CROSS/BLUE SHIELD | Admitting: "Endocrinology

## 2017-06-23 ENCOUNTER — Encounter: Payer: Self-pay | Admitting: "Endocrinology

## 2017-06-23 VITALS — BP 133/82 | HR 108 | Ht 65.0 in | Wt 174.0 lb

## 2017-06-23 DIAGNOSIS — I10 Essential (primary) hypertension: Secondary | ICD-10-CM | POA: Insufficient documentation

## 2017-06-23 DIAGNOSIS — E1165 Type 2 diabetes mellitus with hyperglycemia: Secondary | ICD-10-CM | POA: Diagnosis not present

## 2017-06-23 MED ORDER — INSULIN DETEMIR 100 UNIT/ML FLEXPEN: 50.0000 [IU] | PEN_INJECTOR | Freq: Every day | SUBCUTANEOUS | 2 refills | Status: DC

## 2017-06-23 MED ORDER — INSULIN PEN NEEDLE 31G X 8 MM MISC: 1.0000 | 3 refills | Status: DC

## 2017-06-23 NOTE — Patient Instructions (Signed)

## 2017-06-23 NOTE — Progress Notes (Signed)
Subjective:    Patient ID: [name], female    DOB: 1971/08/12.  she is being seen in consultation for management of currently uncontrolled symptomatic diabetes requested by  Craig Staggers, MD.   Past Medical History:  Diagnosis Date  . Diabetes mellitus without complication (HCC)   . Infection    bladder infection  . Ovarian cyst   . Vaginal Pap smear, abnormal    laser removal of abn cells; normal since  . Varicose veins during pregnancy, antepartum    Past Surgical History:  Procedure Laterality Date  . CERVICAL CERCLAGE N/A 10/12/2014   Procedure: CERCLAGE CERVICAL;  Surgeon: Turner Daniels, MD;  Location: [facility];  Service: Gynecology;  Laterality: N/A;  . OOPHORECTOMY    . OVARIAN CYST REMOVAL     Social History   Social History  . Marital status: Married    Spouse name: N/A  . Number of children: N/A  . Years of education: N/A   Social History Main Topics  . Smoking status: Never Smoker  . Smokeless tobacco: Never Used  . Alcohol use Yes     Comment: rare, none with pregnancy  . Drug use: No  . Sexual activity: Not Currently    Birth control/ protection: Pill     Comment: on pill when found out pregnant   Other Topics Concern  . None   Social History Narrative  . None   Outpatient Encounter Prescriptions as of 06/23/2017  Medication Sig  . lisinopril (PRINIVIL,ZESTRIL) 10 MG tablet Take 10 mg by mouth daily.  . sitaGLIPtin-metformin (JANUMET) 50-1000 MG tablet Take 1 tablet by mouth 2 (two) times daily with a meal.  . [DISCONTINUED] Insulin Detemir (LEVEMIR) 100 UNIT/ML Pen Inject 25 Units into the skin at bedtime.   . Insulin Detemir (LEVEMIR FLEXTOUCH) 100 UNIT/ML Pen Inject 50 Units into the skin daily at 10 pm.  . Insulin Pen Needle (B-D ULTRAFINE III SHORT PEN) 31G X 8 MM MISC 1 each by Does not apply route as directed.  . [DISCONTINUED] docusate sodium (COLACE) 100 MG capsule Take 200 mg by mouth 2 (two) times daily.  . [DISCONTINUED]  ibuprofen (ADVIL,MOTRIN) 600 MG tablet Take 1 tablet (600 mg total) by mouth every 6 (six) hours.  . [DISCONTINUED] metFORMIN (GLUCOPHAGE) 500 MG tablet Take 1 tablet (500 mg total) by mouth 3 (three) times daily.  . [DISCONTINUED] Prenatal Vit-Fe Fumarate-FA (PRENATAL MULTIVITAMIN) TABS tablet Take 1 tablet by mouth daily at 12 noon.   No facility-administered encounter medications on file as of 06/23/2017.     ALLERGIES: No Known Allergies  VACCINATION STATUS: Immunization History  Administered Date(s) Administered  . Tdap 01/22/2015    Diabetes  She presents for her initial diabetic visit. She has type 2 diabetes mellitus. Onset time: She was diagnosed at approximate age of 19 years. Her disease course has been worsening. There are no hypoglycemic associated symptoms. Pertinent negatives for hypoglycemia include no confusion, headaches, pallor or seizures. Associated symptoms include fatigue, polydipsia and polyuria. Pertinent negatives for diabetes include no chest pain and no polyphagia. There are no hypoglycemic complications. Symptoms are worsening. Risk factors for coronary artery disease include dyslipidemia, diabetes mellitus, family history and sedentary lifestyle. Current diabetic treatment includes insulin injections and oral agent (dual therapy). Her weight is decreasing steadily. She is following a generally unhealthy diet. When asked about meal planning, she reported none. She has not had a previous visit with a dietitian. She participates in exercise intermittently. Her home  blood glucose trend is increasing steadily. Her overall blood glucose range is >200 mg/dl. (She has a log with her showing random blood glucose monitoring averaging greater than 200 mg/dL.) An ACE inhibitor/angiotensin II receptor blocker is being taken. Eye exam is not current.  Hypertension  This is a chronic problem. The current episode started more than 1 year ago. Pertinent negatives include no chest pain,  headaches, palpitations or shortness of breath. Risk factors for coronary artery disease include dyslipidemia, diabetes mellitus and sedentary lifestyle. Past treatments include ACE inhibitors.   Review of Systems  Constitutional: Positive for fatigue. Negative for chills, fever and unexpected weight change.  HENT: Negative for trouble swallowing and voice change.   Eyes: Negative for visual disturbance.  Respiratory: Negative for cough, shortness of breath and wheezing.   Cardiovascular: Negative for chest pain, palpitations and leg swelling.  Gastrointestinal: Negative for diarrhea, nausea and vomiting.  Endocrine: Positive for polydipsia and polyuria. Negative for cold intolerance, heat intolerance and polyphagia.  Musculoskeletal: Negative for arthralgias and myalgias.  Skin: Negative for color change, pallor, rash and wound.  Neurological: Negative for seizures and headaches.  Psychiatric/Behavioral: Negative for confusion and suicidal ideas.    Objective:    BP 133/82   Pulse (!) 108   Ht  (1.651 m)   Wt 174 lb (78.9 kg)   BMI 28.96 kg/m   Wt Readings from Last 3 Encounters:  06/23/17 174 lb (78.9 kg)  01/16/15 184 lb 9.6 oz (83.7 kg)  01/02/15 187 lb 3.2 oz (84.9 kg)     Physical Exam  Constitutional: She is oriented to person, place, and time. She appears well-developed.  HENT:  Head: Normocephalic and atraumatic.  Eyes: EOM are normal.  Neck: Normal range of motion. Neck supple. No tracheal deviation present. No thyromegaly present.  Cardiovascular: Normal rate and regular rhythm.   Pulmonary/Chest: Effort normal and breath sounds normal.  Abdominal: Soft. Bowel sounds are normal. There is no tenderness. There is no guarding.  Musculoskeletal: Normal range of motion. She exhibits no edema.  Neurological: She is alert and oriented to person, place, and time. She has normal reflexes. No cranial nerve deficit. Coordination normal.  Skin: Skin is warm and dry. No  rash noted. No erythema. No pallor.  Psychiatric: She has a normal mood and affect. Judgment normal.   CMP ( most recent) CMP     Component Value Date/Time   NA 135 01/14/2015 1720   K 4.2 01/14/2015 1720   CL 106 01/14/2015 1720   CO2 20 01/14/2015 1720   GLUCOSE 79 01/14/2015 1720   BUN 18 05/25/2017   CREATININE 0.8 05/25/2017   CREATININE 0.85 01/14/2015 1720   CALCIUM 8.9 01/14/2015 1720   PROT 6.8 01/14/2015 1720   ALBUMIN 3.1 (L) 01/14/2015 1720   AST 20 01/14/2015 1720   ALT 16 01/14/2015 1720   ALKPHOS 81 01/14/2015 1720   BILITOT 0.5 01/14/2015 1720   GFRNONAA 84 (L) 01/14/2015 1720   GFRAA >90 01/14/2015 1720     Diabetic Labs (most recent): Lab Results  Component Value Date   HGBA1C 11.9 05/25/2017   HGBA1C 9 09/23/2015     Lipid Panel ( most recent) Lipid Panel     Component Value Date/Time   CHOL 135 05/25/2017   TRIG 106 05/25/2017   HDL 49 05/25/2017   LDLCALC 67 05/25/2017      Assessment & Plan:   1. Uncontrolled type 2 diabetes mellitus with hyperglycemia (HCC)   -  Patient has currently uncontrolled symptomatic type 2 DM since  46 years of age,  with most recent A1c of 11.9 %. Recent labs reviewed.  - She does not report any gross competitions from her diabetes, however,  Yardley Beltran remains at a high risk for more acute and chronic complications which include CAD, CVA, CKD, retinopathy, and neuropathy. These are all discussed in detail with the patient.  - I have counseled her on diet management and weight loss, by adopting a carbohydrate restricted/protein rich diet.  - Suggestion is made for her to avoid simple carbohydrates  from her diet including Cakes, Sweet Desserts, Ice Cream, Soda (diet and regular), Sweet Tea, Candies, Chips, Cookies, Store Bought Juices, Alcohol in Excess of  1-2 drinks a day, Artificial Sweeteners, and "Sugar-free" Products. This will help patient to have stable blood glucose profile and potentially avoid  unintended weight gain.  - I encouraged her to switch to  unprocessed or minimally processed complex starch and increased protein intake (animal or plant source), fruits, and vegetables.  - she is advised to stick to a routine mealtimes to eat 3 meals  a day and avoid unnecessary snacks ( to snack only to correct hypoglycemia).   - she will be scheduled with Norm Salt, RDN, CDE for individualized DM education.  - I have approached her with the following individualized plan to manage diabetes and patient agrees:   - Based on her presenting glycemic profile and her recent A1c of 11.9%, she may require basal /bolus insulin to achieve control of diabetes to target . - I  advised her to initiate  Initiate  strict monitoring of glucose 4 times a day-before meals and at bedtime and return in one week with her meter and logs for reevaluation.  - In the meantime, I advised her to increase her Levemir to 50 units daily at bedtime. I prescribed Levemir Flextouch in place of her Levemir vials. - Patient is warned not to take insulin without proper monitoring per orders.  -Patient is encouraged to call clinic for blood glucose levels less than 70 or above 300 mg /dl. - I will continue  Janumet 50/1000 mg by mouth twice a day, therapeutically suitable for patient .  - Patient specific target  A1c;  LDL, HDL, Triglycerides, and  Waist Circumference were discussed in detail.  2) BP/HTN: controlled. Continue current medications including ACEI/ARB. 3) Lipids/HPL: uncontrolled with LDL 67.   Patient is not on statins. 4)  Weight/Diet: CDE Consult will be initiated , exercise, and detailed carbohydrates information provided.  5) Chronic Care/Health Maintenance:  -she  is on ACEI medications and  is encouraged to continue to follow up with Ophthalmology, Dentist,  Podiatrist at least yearly or according to recommendations, and advised to   stay away from smoking. I have recommended yearly flu vaccine and  pneumonia vaccination at least every 5 years; moderate intensity exercise for up to 150 minutes weekly; and  sleep for at least 7 hours a day.  - Time spent with the patient: 1 hour, of which >50% was spent in obtaining information about her symptoms, reviewing her previous labs, evaluations, and treatments, counseling her about her  currently uncontrolled type 2 diabetes, hypertension, and developing a plan for long term treatment; she had a number of questions which I addressed.  - I advised patient to maintain close follow up with Craig Staggers, MD for primary care needs.  Follow up plan: - Return in about 1 week (around 06/30/2017) for follow  up with meter and logs- no labs.  Marquis Lunch, MD Phone: [phone]  Fax: [phone]   06/23/2017, 1:36 PM This note was partially dictated with voice recognition software. Similar sounding words can be transcribed inadequately or may not  be corrected upon review.

## 2017-07-05 ENCOUNTER — Other Ambulatory Visit: Payer: Self-pay | Admitting: "Endocrinology

## 2017-07-05 ENCOUNTER — Encounter: Payer: BLUE CROSS/BLUE SHIELD | Attending: "Endocrinology | Admitting: Nutrition

## 2017-07-05 ENCOUNTER — Encounter: Payer: Self-pay | Admitting: "Endocrinology

## 2017-07-05 ENCOUNTER — Ambulatory Visit (INDEPENDENT_AMBULATORY_CARE_PROVIDER_SITE_OTHER): Payer: BLUE CROSS/BLUE SHIELD | Admitting: "Endocrinology

## 2017-07-05 VITALS — BP 135/89 | HR 88 | Ht 65.0 in | Wt 177.0 lb

## 2017-07-05 VITALS — Ht 65.0 in | Wt 177.0 lb

## 2017-07-05 DIAGNOSIS — E1165 Type 2 diabetes mellitus with hyperglycemia: Secondary | ICD-10-CM

## 2017-07-05 DIAGNOSIS — I10 Essential (primary) hypertension: Secondary | ICD-10-CM | POA: Diagnosis not present

## 2017-07-05 DIAGNOSIS — IMO0002 Reserved for concepts with insufficient information to code with codable children: Secondary | ICD-10-CM

## 2017-07-05 DIAGNOSIS — Z713 Dietary counseling and surveillance: Secondary | ICD-10-CM | POA: Diagnosis not present

## 2017-07-05 DIAGNOSIS — E118 Type 2 diabetes mellitus with unspecified complications: Secondary | ICD-10-CM

## 2017-07-05 MED ORDER — INSULIN DETEMIR 100 UNIT/ML FLEXPEN: 40.0000 [IU] | PEN_INJECTOR | Freq: Every day | SUBCUTANEOUS | 2 refills | Status: DC

## 2017-07-05 NOTE — Progress Notes (Signed)
Subjective:    Patient ID: [name], female    DOB: [birth year].  she is being seen in consultation for management of currently uncontrolled symptomatic diabetes requested by  Craig Staggers, MD.   Past Medical History:  Diagnosis Date  . Diabetes mellitus without complication (HCC)   . Infection    bladder infection  . Ovarian cyst   . Vaginal Pap smear, abnormal    laser removal of abn cells; normal since  . Varicose veins during pregnancy, antepartum    Past Surgical History:  Procedure Laterality Date  . CERVICAL CERCLAGE N/A 10/12/2014   Procedure: CERCLAGE CERVICAL;  Surgeon: Turner Daniels, MD;  Location: [facility];  Service: Gynecology;  Laterality: N/A;  . OOPHORECTOMY    . OVARIAN CYST REMOVAL     Social History   Social History  . Marital status: Married    Spouse name: N/A  . Number of children: N/A  . Years of education: N/A   Social History Main Topics  . Smoking status: Never Smoker  . Smokeless tobacco: Never Used  . Alcohol use Yes     Comment: rare, none with pregnancy  . Drug use: No  . Sexual activity: Not Currently    Birth control/ protection: Pill     Comment: on pill when found out pregnant   Other Topics Concern  . None   Social History Narrative  . None   Outpatient Encounter Prescriptions as of 07/05/2017  Medication Sig  . Insulin Detemir (LEVEMIR FLEXTOUCH) 100 UNIT/ML Pen Inject 40 Units into the skin daily at 10 pm.  . Insulin Pen Needle (B-D ULTRAFINE III SHORT PEN) 31G X 8 MM MISC 1 each by Does not apply route as directed.  Marland Kitchen lisinopril (PRINIVIL,ZESTRIL) 10 MG tablet Take 10 mg by mouth daily.  . sitaGLIPtin-metformin (JANUMET) 50-1000 MG tablet Take 1 tablet by mouth 2 (two) times daily with a meal.  . [DISCONTINUED] Insulin Detemir (LEVEMIR FLEXTOUCH) 100 UNIT/ML Pen Inject 50 Units into the skin daily at 10 pm.   No facility-administered encounter medications on file as of 07/05/2017.     ALLERGIES: No Known  Allergies  VACCINATION STATUS: Immunization History  Administered Date(s) Administered  . Tdap 01/22/2015    Diabetes  She presents for her follow-up diabetic visit. She has type 2 diabetes mellitus. Onset time: She was diagnosed at approximate age of 46 years. Her disease course has been improving. There are no hypoglycemic associated symptoms. Pertinent negatives for hypoglycemia include no confusion, headaches, pallor or seizures. Associated symptoms include fatigue, polydipsia and polyuria. Pertinent negatives for diabetes include no chest pain and no polyphagia. There are no hypoglycemic complications. Symptoms are improving. Risk factors for coronary artery disease include dyslipidemia, diabetes mellitus, family history and sedentary lifestyle. Current diabetic treatment includes insulin injections and oral agent (dual therapy). Her weight is decreasing steadily. She is following a generally unhealthy diet. When asked about meal planning, she reported none. She has had a previous visit with a dietitian. She participates in exercise intermittently. Her home blood glucose trend is decreasing steadily. Her breakfast blood glucose range is generally 130-140 mg/dl. Her lunch blood glucose range is generally 140-180 mg/dl. Her dinner blood glucose range is generally 140-180 mg/dl. Her bedtime blood glucose range is generally 140-180 mg/dl. Her overall blood glucose range is 140-180 mg/dl. (She has a log with her showing random blood glucose monitoring averaging greater than 200 mg/dL.) An ACE inhibitor/angiotensin II receptor blocker is being taken. Eye  exam is not current.  Hypertension  This is a chronic problem. The current episode started more than 1 year ago. Pertinent negatives include no chest pain, headaches, palpitations or shortness of breath. Risk factors for coronary artery disease include dyslipidemia, diabetes mellitus and sedentary lifestyle. Past treatments include ACE inhibitors.    Review of Systems  Constitutional: Positive for fatigue. Negative for chills, fever and unexpected weight change.  HENT: Negative for trouble swallowing and voice change.   Eyes: Negative for visual disturbance.  Respiratory: Negative for cough, shortness of breath and wheezing.   Cardiovascular: Negative for chest pain, palpitations and leg swelling.  Gastrointestinal: Negative for diarrhea, nausea and vomiting.  Endocrine: Positive for polydipsia and polyuria. Negative for cold intolerance, heat intolerance and polyphagia.  Musculoskeletal: Negative for arthralgias and myalgias.  Skin: Negative for color change, pallor, rash and wound.  Neurological: Negative for seizures and headaches.  Psychiatric/Behavioral: Negative for confusion and suicidal ideas.    Objective:    BP 135/89   Pulse 88   Ht  (1.651 m)   Wt 177 lb (80.3 kg)   BMI 29.45 kg/m   Wt Readings from Last 3 Encounters:  07/05/17 177 lb (80.3 kg)  06/23/17 174 lb (78.9 kg)  01/16/15 184 lb 9.6 oz (83.7 kg)     Physical Exam  Constitutional: She is oriented to person, place, and time. She appears well-developed.  HENT:  Head: Normocephalic and atraumatic.  Eyes: EOM are normal.  Neck: Normal range of motion. Neck supple. No tracheal deviation present. No thyromegaly present.  Cardiovascular: Normal rate and regular rhythm.   Pulmonary/Chest: Effort normal and breath sounds normal.  Abdominal: Soft. Bowel sounds are normal. There is no tenderness. There is no guarding.  Musculoskeletal: Normal range of motion. She exhibits no edema.  Neurological: She is alert and oriented to person, place, and time. She has normal reflexes. No cranial nerve deficit. Coordination normal.  Skin: Skin is warm and dry. No rash noted. No erythema. No pallor.  Psychiatric: She has a normal mood and affect. Judgment normal.   CMP ( most recent) CMP     Component Value Date/Time   NA 135 01/14/2015 1720   K 4.2 01/14/2015  1720   CL 106 01/14/2015 1720   CO2 20 01/14/2015 1720   GLUCOSE 79 01/14/2015 1720   BUN 18 05/25/2017   CREATININE 0.8 05/25/2017   CREATININE 0.85 01/14/2015 1720   CALCIUM 8.9 01/14/2015 1720   PROT 6.8 01/14/2015 1720   ALBUMIN 3.1 (L) 01/14/2015 1720   AST 20 01/14/2015 1720   ALT 16 01/14/2015 1720   ALKPHOS 81 01/14/2015 1720   BILITOT 0.5 01/14/2015 1720   GFRNONAA 84 (L) 01/14/2015 1720   GFRAA >90 01/14/2015 1720     Diabetic Labs (most recent): Lab Results  Component Value Date   HGBA1C 11.9 05/25/2017   HGBA1C 9 09/23/2015     Lipid Panel ( most recent) Lipid Panel     Component Value Date/Time   CHOL 135 05/25/2017   TRIG 106 05/25/2017   HDL 49 05/25/2017   LDLCALC 67 05/25/2017      Assessment & Plan:   1. Uncontrolled type 2 diabetes mellitus with hyperglycemia (HCC)   - Patient has currently uncontrolled symptomatic type 2 DM since  46 years of age. - Her most recent A1c was 11.9%. - She came with near target blood glucose profile since last visit, only on basal insulin.  - She does not report  any gross competitions from her diabetes, however,  Kristien Salatino remains at a high risk for more acute and chronic complications which include CAD, CVA, CKD, retinopathy, and neuropathy. These are all discussed in detail with the patient.  - I have counseled her on diet management and weight loss, by adopting a carbohydrate restricted/protein rich diet.  -  Suggestion is made for her to avoid simple carbohydrates  from her diet including Cakes, Sweet Desserts / Pastries, Ice Cream, Soda (diet and regular), Sweet Tea, Candies, Chips, Cookies, Store Bought Juices, Alcohol in Excess of  1-2 drinks a day, Artificial Sweeteners, and "Sugar-free" Products. This will help patient to have stable blood glucose profile and potentially avoid unintended weight gain.   - I encouraged her to switch to  unprocessed or minimally processed complex starch and increased  protein intake (animal or plant source), fruits, and vegetables.  - she is advised to stick to a routine mealtimes to eat 3 meals  a day and avoid unnecessary snacks ( to snack only to correct hypoglycemia).    - I have approached her with the following individualized plan to manage diabetes and patient agrees:   - Despite her recent A1c of 11.9%, she came with near target blood glucose profile, she will not need prandial insulin for now.  - I  advised her to lower her Levemir to 40 units daily at bedtime,  associated with strict monitoring of glucose 2 times a day-before breakfast and at bedtime .  - Patient is warned not to take insulin without proper monitoring per orders.  -Patient is encouraged to call clinic for blood glucose levels less than 70 or above 300 mg /dl. - I will continue  Janumet 50/1000 mg by mouth twice a day, therapeutically suitable for patient .  - Patient specific target  A1c;  LDL, HDL, Triglycerides, and  Waist Circumference were discussed in detail.  2) BP/HTN: Controlled. Continue current medications including ACEI/ARB. 3) Lipids/HPL: uncontrolled with LDL 67.   Patient is not on statins. 4)  Weight/Diet: CDE Consult will be initiated , exercise, and detailed carbohydrates information provided.  5) Chronic Care/Health Maintenance:  -she  is on ACEI medications and  is encouraged to continue to follow up with Ophthalmology, Dentist,  Podiatrist at least yearly or according to recommendations, and advised to   stay away from smoking. I have recommended yearly flu vaccine and pneumonia vaccination at least every 5 years; moderate intensity exercise for up to 150 minutes weekly; and  sleep for at least 7 hours a day.  - Time spent with the patient: 25 min, of which >50% was spent in reviewing her sugar logs , discussing her hypo- and hyper-glycemic episodes, reviewing her current and  previous labs and insulin doses and developing a plan to avoid hypo- and  hyper-glycemia.    - I advised patient to maintain close follow up with Craig Staggers, MD for primary care needs.  Follow up plan: - Return in about 9 weeks (around 09/06/2017) for follow up with pre-visit labs, meter, and logs.  Marquis Lunch, MD Phone: [phone]  Fax: [phone]   07/05/2017, 10:52 AM This note was partially dictated with voice recognition software. Similar sounding words can be transcribed inadequately or may not  be corrected upon review.

## 2017-07-05 NOTE — Patient Instructions (Signed)
Goals 1. Follow Plate Mtthod 2. Exercise 30 minutes 4 times per week. 3. Eat 45 grams of carbs per meal 4. Cut out snacks. 5. Test twice a day 6. Take insulin as prescribed- 40 units of Levemeir at night. Janument daily twice a day Test blood sugars twice a day.

## 2017-07-05 NOTE — Progress Notes (Signed)
  Medical Nutrition Therapy:  Appt start time: 1130 end time:  1230.   Assessment:  Primary concerns today: Diabetes Type 2. Here to see Dr. Fransico Him, Endocrinology also. PCP Dr. Phillips Odor. 40 units of Levemir and Janument 50/1000 twice a day.Marland Kitchen Has been testing 4 times per day. Her BS are much better and near target ranges before meals. Dr. Fransico Him doesn't think she will need prandial insulin at this time. Eating better balanced meals on time now. Cut out junk food, sweets and snacks. Feels much better.  She is engaged in making lifestyle changes with her diet and exercise to improve health and diabetes.  Lab Results  Component Value Date   HGBA1C 11.9 05/25/2017      Preferred Learning Style:  No preference indicated   Learning Readiness:   Ready  Change in progress   MEDICATIONS: See list   DIETARY INTAKE:  Eat 3 meals per day and now drinkig water  Usual physical activity: Walking some.  Estimated energy needs: 1500  calories 170 g carbohydrates 112 g protein 42 g fat  Progress Towards Goal(s):  In progress.   Nutritional Diagnosis:  NB-1.1 Food and nutrition-related knowledge deficit As related to Diabetes Type 2.  As evidenced by A1C 11.9.    Intervention:  Nutrition and Diabetes education provided on My Plate, CHO counting, meal planning, portion sizes, timing of meals, avoiding snacks between meals unless having a low blood sugar, target ranges for A1C and blood sugars, signs/symptoms and treatment of hyper/hypoglycemia, monitoring blood sugars, taking medications as prescribed, benefits of exercising 30 minutes per day and prevention of complications of DM. Marland KitchenGoals 1. Follow Plate Mtthod 2. Exercise 30 minutes 4 times per week. 3. Eat 45 grams of carbs per meal 4. Cut out snacks. 5. Test twice a day 6. Take insulin as prescribed- 40 units of Levemeir at night. Janument daily twice a day Test blood sugars twice a day.  Teaching Method Utilized:   Visual Auditory Hands on  Handouts given during visit include:  The Plate Method   Meal Plan Card  Diabetes LIving Well   Barriers to learning/adherence to lifestyle change: none  Demonstrated degree of understanding via:  Teach Back   Monitoring/Evaluation:  Dietary intake, exercise, meal planing, , and body weight in 1- 3 month(s).

## 2017-07-05 NOTE — Patient Instructions (Signed)

## 2017-09-06 ENCOUNTER — Ambulatory Visit: Payer: BLUE CROSS/BLUE SHIELD | Admitting: "Endocrinology

## 2017-09-06 ENCOUNTER — Ambulatory Visit: Payer: BLUE CROSS/BLUE SHIELD | Admitting: Nutrition

## 2017-09-15 ENCOUNTER — Ambulatory Visit: Payer: BLUE CROSS/BLUE SHIELD | Admitting: Nutrition

## 2017-09-15 ENCOUNTER — Ambulatory Visit: Payer: BLUE CROSS/BLUE SHIELD | Admitting: "Endocrinology

## 2017-09-23 ENCOUNTER — Other Ambulatory Visit: Payer: Self-pay | Admitting: "Endocrinology

## 2017-09-24 LAB — CMP14+EGFR
ALT: 25 IU/L (ref 0–32)
AST: 22 IU/L (ref 0–40)
Albumin/Globulin Ratio: 1.8 (ref 1.2–2.2)
Albumin: 4.9 g/dL (ref 3.5–5.5)
Alkaline Phosphatase: 35 IU/L — ABNORMAL LOW (ref 39–117)
BUN / CREAT RATIO: 12 (ref 9–23)
BUN: 10 mg/dL (ref 6–24)
Bilirubin Total: 0.8 mg/dL (ref 0.0–1.2)
CO2: 23 mmol/L (ref 20–29)
CREATININE: 0.85 mg/dL (ref 0.57–1.00)
Calcium: 9.5 mg/dL (ref 8.7–10.2)
Chloride: 96 mmol/L (ref 96–106)
GFR calc non Af Amer: 84 mL/min/{1.73_m2} (ref >59)
GFR, EST AFRICAN AMERICAN: 96 mL/min/{1.73_m2} (ref >59)
GLOBULIN, TOTAL: 2.7 g/dL (ref 1.5–4.5)
GLUCOSE: 175 mg/dL — AB (ref 65–99)
Potassium: 5 mmol/L (ref 3.5–5.2)
SODIUM: 136 mmol/L (ref 134–144)
TOTAL PROTEIN: 7.6 g/dL (ref 6.0–8.5)

## 2017-09-24 LAB — VITAMIN D 25 HYDROXY (VIT D DEFICIENCY, FRACTURES): Vit D, 25-Hydroxy: 12.4 ng/mL — ABNORMAL LOW (ref 30.0–100.0)

## 2017-09-24 LAB — TSH: TSH: 1.88 u[IU]/mL (ref 0.450–4.500)

## 2017-09-24 LAB — HGB A1C W/O EAG: HEMOGLOBIN A1C: 8.7 % — AB (ref 4.8–5.6)

## 2017-09-24 LAB — MICROALBUMIN / CREATININE URINE RATIO
Creatinine, Urine: 69.7 mg/dL
Microalb/Creat Ratio: 9.9 mg/g creat (ref 0.0–30.0)
Microalbumin, Urine: 6.9 ug/mL

## 2017-09-24 LAB — T4, FREE: Free T4: 1.08 ng/dL (ref 0.82–1.77)

## 2017-09-25 ENCOUNTER — Other Ambulatory Visit: Payer: Self-pay | Admitting: "Endocrinology

## 2017-09-27 ENCOUNTER — Encounter: Payer: Self-pay | Admitting: Nutrition

## 2017-09-27 ENCOUNTER — Ambulatory Visit (INDEPENDENT_AMBULATORY_CARE_PROVIDER_SITE_OTHER): Payer: BLUE CROSS/BLUE SHIELD | Admitting: "Endocrinology

## 2017-09-27 ENCOUNTER — Encounter: Payer: Self-pay | Admitting: "Endocrinology

## 2017-09-27 ENCOUNTER — Encounter: Payer: BLUE CROSS/BLUE SHIELD | Attending: "Endocrinology | Admitting: Nutrition

## 2017-09-27 VITALS — BP 133/85 | HR 90 | Ht 65.0 in | Wt 176.0 lb

## 2017-09-27 VITALS — Wt 176.0 lb

## 2017-09-27 DIAGNOSIS — E559 Vitamin D deficiency, unspecified: Secondary | ICD-10-CM | POA: Diagnosis not present

## 2017-09-27 DIAGNOSIS — I10 Essential (primary) hypertension: Secondary | ICD-10-CM

## 2017-09-27 DIAGNOSIS — E669 Obesity, unspecified: Secondary | ICD-10-CM

## 2017-09-27 DIAGNOSIS — E1165 Type 2 diabetes mellitus with hyperglycemia: Secondary | ICD-10-CM

## 2017-09-27 DIAGNOSIS — IMO0002 Reserved for concepts with insufficient information to code with codable children: Secondary | ICD-10-CM

## 2017-09-27 DIAGNOSIS — E118 Type 2 diabetes mellitus with unspecified complications: Principal | ICD-10-CM

## 2017-09-27 MED ORDER — INSULIN DETEMIR 100 UNIT/ML FLEXPEN: 50.0000 [IU] | PEN_INJECTOR | Freq: Every day | SUBCUTANEOUS | 2 refills | Status: DC

## 2017-09-27 MED ORDER — VITAMIN D (ERGOCALCIFEROL) 1.25 MG (50000 UNIT) PO CAPS: 50000.0000 [IU] | ORAL_CAPSULE | ORAL | 0 refills | Status: DC

## 2017-09-27 NOTE — Patient Instructions (Signed)

## 2017-09-27 NOTE — Progress Notes (Signed)
Subjective:    Patient ID: [name], female    DOB: 1971-09-08.  she is being seen in follow-up for management of currently uncontrolled symptomatic diabetes requested by  Craig Staggers, MD.   Past Medical History:  Diagnosis Date  . Diabetes mellitus without complication (HCC)   . Infection    bladder infection  . Ovarian cyst   . Vaginal Pap smear, abnormal    laser removal of abn cells; normal since  . Varicose veins during pregnancy, antepartum    Past Surgical History:  Procedure Laterality Date  . CERVICAL CERCLAGE N/A 10/12/2014   Procedure: CERCLAGE CERVICAL;  Surgeon: Turner Daniels, MD;  Location: [facility];  Service: Gynecology;  Laterality: N/A;  . OOPHORECTOMY    . OVARIAN CYST REMOVAL     Social History   Socioeconomic History  . Marital status: Married    Spouse name: None  . Number of children: None  . Years of education: None  . Highest education level: None  Social Needs  . Financial resource strain: None  . Food insecurity - worry: None  . Food insecurity - inability: None  . Transportation needs - medical: None  . Transportation needs - non-medical: None  Occupational History  . None  Tobacco Use  . Smoking status: Never Smoker  . Smokeless tobacco: Never Used  Substance and Sexual Activity  . Alcohol use: Yes    Comment: rare, none with pregnancy  . Drug use: No  . Sexual activity: Not Currently    Birth control/protection: Pill    Comment: on pill when found out pregnant  Other Topics Concern  . None  Social History Narrative  . None   Outpatient Encounter Medications as of 09/27/2017  Medication Sig  . Insulin Detemir (LEVEMIR FLEXTOUCH) 100 UNIT/ML Pen Inject 50 Units into the skin daily at 10 pm.  . Insulin Pen Needle (B-D ULTRAFINE III SHORT PEN) 31G X 8 MM MISC 1 each by Does not apply route as directed.  Marland Kitchen lisinopril (PRINIVIL,ZESTRIL) 10 MG tablet Take 10 mg by mouth daily.  . sitaGLIPtin-metformin (JANUMET) 50-1000 MG  tablet Take 1 tablet by mouth 2 (two) times daily with a meal.  . Vitamin D, Ergocalciferol, (DRISDOL) 50000 units CAPS capsule Take 1 capsule (50,000 Units total) by mouth every 7 (seven) days.  . [DISCONTINUED] Insulin Detemir (LEVEMIR FLEXTOUCH) 100 UNIT/ML Pen Inject 40 Units into the skin daily at 10 pm.   No facility-administered encounter medications on file as of 09/27/2017.     ALLERGIES: No Known Allergies  VACCINATION STATUS: Immunization History  Administered Date(s) Administered  . Tdap 01/22/2015    Diabetes  She presents for her follow-up diabetic visit. She has type 2 diabetes mellitus. Onset time: She was diagnosed at approximate age of 47 years. Her disease course has been improving. There are no hypoglycemic associated symptoms. Pertinent negatives for hypoglycemia include no confusion, headaches, pallor or seizures. Associated symptoms include fatigue. Pertinent negatives for diabetes include no chest pain, no polydipsia, no polyphagia and no polyuria. There are no hypoglycemic complications. Symptoms are improving. Risk factors for coronary artery disease include dyslipidemia, diabetes mellitus, family history and sedentary lifestyle. Current diabetic treatment includes insulin injections and oral agent (dual therapy). Her weight is stable. She is following a generally unhealthy diet. When asked about meal planning, she reported none. She has had a previous visit with a dietitian. She participates in exercise intermittently. Her home blood glucose trend is decreasing steadily. Her breakfast  blood glucose range is generally 180-200 mg/dl. Her bedtime blood glucose range is generally 180-200 mg/dl. Her overall blood glucose range is 180-200 mg/dl. An ACE inhibitor/angiotensin II receptor blocker is being taken. Eye exam is not current.  Hypertension  This is a chronic problem. The current episode started more than 1 year ago. Pertinent negatives include no chest pain, headaches,  palpitations or shortness of breath. Risk factors for coronary artery disease include dyslipidemia, diabetes mellitus and sedentary lifestyle. Past treatments include ACE inhibitors.   Review of Systems  Constitutional: Positive for fatigue. Negative for chills, fever and unexpected weight change.  HENT: Negative for trouble swallowing and voice change.   Eyes: Negative for visual disturbance.  Respiratory: Negative for cough, shortness of breath and wheezing.   Cardiovascular: Negative for chest pain, palpitations and leg swelling.  Gastrointestinal: Negative for diarrhea, nausea and vomiting.  Endocrine: Negative for cold intolerance, heat intolerance, polydipsia, polyphagia and polyuria.  Musculoskeletal: Negative for arthralgias and myalgias.  Skin: Negative for color change, pallor, rash and wound.  Neurological: Negative for seizures and headaches.  Psychiatric/Behavioral: Negative for confusion and suicidal ideas.    Objective:    BP 133/85   Pulse 90   Ht 5\' 5"  (1.651 m)   Wt 176 lb (79.8 kg)   BMI 29.29 kg/m   Wt Readings from Last 3 Encounters:  09/27/17 176 lb (79.8 kg)  07/05/17 177 lb (80.3 kg)  07/05/17 177 lb (80.3 kg)     Physical Exam  Constitutional: She is oriented to person, place, and time. She appears well-developed.  HENT:  Head: Normocephalic and atraumatic.  Eyes: EOM are normal.  Neck: Normal range of motion. Neck supple. No tracheal deviation present. No thyromegaly present.  Cardiovascular: Normal rate and regular rhythm.  Pulmonary/Chest: Effort normal and breath sounds normal.  Abdominal: Soft. Bowel sounds are normal. There is no tenderness. There is no guarding.  Musculoskeletal: Normal range of motion. She exhibits no edema.  Neurological: She is alert and oriented to person, place, and time. She has normal reflexes. No cranial nerve deficit. Coordination normal.  Skin: Skin is warm and dry. No rash noted. No erythema. No pallor.   Psychiatric: She has a normal mood and affect. Judgment normal.   CMP ( most recent) CMP     Component Value Date/Time   NA 136 09/23/2017 1555   K 5.0 09/23/2017 1555   CL 96 09/23/2017 1555   CO2 23 09/23/2017 1555   GLUCOSE 175 (H) 09/23/2017 1555   GLUCOSE 79 01/14/2015 1720   BUN 10 09/23/2017 1555   CREATININE 0.85 09/23/2017 1555   CALCIUM 9.5 09/23/2017 1555   PROT 7.6 09/23/2017 1555   ALBUMIN 4.9 09/23/2017 1555   AST 22 09/23/2017 1555   ALT 25 09/23/2017 1555   ALKPHOS 35 (L) 09/23/2017 1555   BILITOT 0.8 09/23/2017 1555   GFRNONAA 84 09/23/2017 1555   GFRAA 96 09/23/2017 1555     Diabetic Labs (most recent): Lab Results  Component Value Date   HGBA1C 8.7 (H) 09/23/2017   HGBA1C 11.9 05/25/2017   HGBA1C 9 09/23/2015     Lipid Panel ( most recent) Lipid Panel     Component Value Date/Time   CHOL 135 05/25/2017   TRIG 106 05/25/2017   HDL 49 05/25/2017   LDLCALC 67 05/25/2017     Assessment & Plan:   1. Uncontrolled type 2 diabetes mellitus with hyperglycemia (HCC)  - Patient has currently uncontrolled symptomatic type 2 DM since  47 years of age. - She came with improving blood glucose profile, A1c 8.7% improving from 11.9%. - She does not report any gross competitions from her diabetes, however,  [name] remains at a high risk for more acute and chronic complications which include CAD, CVA, CKD, retinopathy, and neuropathy. These are all discussed in detail with the patient.  - I have counseled her on diet management and weight loss, by adopting a carbohydrate restricted/protein rich diet.  -  Suggestion is made for her to avoid simple carbohydrates  from her diet including Cakes, Sweet Desserts / Pastries, Ice Cream, Soda (diet and regular), Sweet Tea, Candies, Chips, Cookies, Store Bought Juices, Alcohol in Excess of  1-2 drinks a day, Artificial Sweeteners, and "Sugar-free" Products. This will help patient to have stable blood glucose  profile and potentially avoid unintended weight gain.  - I encouraged her to switch to  unprocessed or minimally processed complex starch and increased protein intake (animal or plant source), fruits, and vegetables.  - she is advised to stick to a routine mealtimes to eat 3 meals  a day and avoid unnecessary snacks ( to snack only to correct hypoglycemia).    - I have approached her with the following individualized plan to manage diabetes and patient agrees:   -  she came with improving to near target blood glucose profile, she will not need prandial insulin for now.  - I  advised her to increase Levemir to 50 units daily at bedtime,  associated with strict monitoring of glucose 2 times a day-before breakfast and at bedtime .  - Patient is warned not to take insulin without proper monitoring per orders.  -Patient is encouraged to call clinic for blood glucose levels less than 70 or above 300 mg /dl. - I will continue  Janumet 50/1000 mg by mouth twice a day, therapeutically suitable for patient .  - Patient specific target  A1c;  LDL, HDL, Triglycerides, and  Waist Circumference were discussed in detail.  2) BP/HTN: Controlled. Continue current medications including ACEI/ARB. 3) Lipids/HPL: uncontrolled with LDL 67.   Patient is not on statins. 4)  Weight/Diet: CDE Consult will be initiated , exercise, and detailed carbohydrates information provided.  5. Vitamin D deficiency-new diagnosis for her . - I discussed initiated ergocalciferol 50,000 units weekly for the next 12 weeks.   6) Chronic Care/Health Maintenance:  -she  is on ACEI medications and  is encouraged to continue to follow up with Ophthalmology, Dentist,  Podiatrist at least yearly or according to recommendations, and advised to   stay away from smoking. I have recommended yearly flu vaccine and pneumonia vaccination at least every 5 years; moderate intensity exercise for up to 150 minutes weekly; and  sleep for at least 7  hours a day.  - Time spent with the patient: 25 min, of which >50% was spent in reviewing her blood glucose logs , discussing her hypo- and hyper-glycemic episodes, reviewing her current and  previous labs and insulin doses and developing a plan to avoid hypo- and hyper-glycemia. Please refer to Patient Instructions for Blood Glucose Monitoring and Insulin/Medications Dosing Guide"  in media tab for additional information.  - I advised patient to maintain close follow up with Craig Staggers, MD for primary care needs.  Follow up plan: - Return in about 3 months (around 12/26/2017) for follow up with pre-visit labs, meter, and logs.  Marquis Lunch, MD Phone: [phone]  Fax: [phone]   09/27/2017, 11:33 AM This note  was partially dictated with voice recognition software. Similar sounding words can be transcribed inadequately or may not  be corrected upon review.

## 2017-09-27 NOTE — Progress Notes (Signed)
  Medical Nutrition Therapy:  Appt start time: 1115end time:  1130  Assessment:  Primary concerns today: Diabetes Type 2. Here to see Dr. Fransico HimNida, Endocrinology also. PCP Dr. Phillips OdorGolding.  Dr. Fransico HimNida increased Levemir to 50 units. Changes:  Has been trying to get to exercising more. Got off schedule during holidays. Being more aware of what she is eatig. Is using a airfryer.  A1C down from 11.9% down to 8.7%.     Eating better balanced meals on time now. Cut out junk food, sweets and snacks. Feels much better.  She is engaged in making lifestyle changes with her diet and exercise to improve health and diabetes.  Lab Results  Component Value Date   HGBA1C 8.7 (H) 09/23/2017     Preferred Learning Style:  No preference indicated   Learning Readiness:   Ready  Change in progress   MEDICATIONS: See list   DIETARY INTAKE:  Eat 3 meals per day and now drinkig water  Usual physical activity: Walking some.  Estimated energy needs: 1500  calories 170 g carbohydrates 112 g protein 42 g fat  Progress Towards Goal(s):  In progress.   Nutritional Diagnosis:  NB-1.1 Food and nutrition-related knowledge deficit As related to Diabetes Type 2.  As evidenced by A1C 11.9.    Intervention:  Nutrition and Diabetes education provided on My Plate, CHO counting, meal planning, portion sizes, timing of meals, avoiding snacks between meals unless having a low blood sugar, target ranges for A1C and blood sugars, signs/symptoms and treatment of hyper/hypoglycemia, monitoring blood sugars, taking medications as prescribed, benefits of exercising 30 minutes per day and prevention of complications of DM.   Goals 1. Continue to increase exercise to 30 minutes a day.- 5 times per week. 2. Drink more water. 5 bottles per day Get A1C down to 7% Increase Levemir to 50 units a day.  Teaching Method Utilized:  Visual Auditory Hands on  Handouts given during visit include:  The Plate Method   Meal Plan  Card  Diabetes LIving Well   Barriers to learning/adherence to lifestyle change: none  Demonstrated degree of understanding via:  Teach Back   Monitoring/Evaluation:  Dietary intake, exercise, meal planing, , and body weight in 3 month(s).

## 2017-09-27 NOTE — Patient Instructions (Addendum)
Goals 1. Continue to increase exercise to 30 minutes a day.- 5 times per week. 2. Drink more water. 5 bottles per day Get A1C down to 7% Increase Levemir to 50 units a day.

## 2017-10-04 ENCOUNTER — Encounter: Payer: Self-pay | Admitting: Nutrition

## 2017-12-27 ENCOUNTER — Ambulatory Visit: Payer: BLUE CROSS/BLUE SHIELD | Admitting: Nutrition

## 2017-12-27 ENCOUNTER — Ambulatory Visit: Payer: BLUE CROSS/BLUE SHIELD | Admitting: "Endocrinology

## 2018-02-02 ENCOUNTER — Ambulatory Visit: Payer: BLUE CROSS/BLUE SHIELD | Admitting: "Endocrinology

## 2018-02-02 ENCOUNTER — Ambulatory Visit: Payer: Self-pay | Admitting: Nutrition

## 2018-02-23 ENCOUNTER — Ambulatory Visit: Payer: BLUE CROSS/BLUE SHIELD | Admitting: "Endocrinology

## 2018-02-23 ENCOUNTER — Ambulatory Visit: Payer: BLUE CROSS/BLUE SHIELD | Admitting: Nutrition

## 2018-04-04 ENCOUNTER — Other Ambulatory Visit: Payer: Self-pay | Admitting: "Endocrinology

## 2018-11-01 ENCOUNTER — Other Ambulatory Visit: Payer: Self-pay | Admitting: "Endocrinology

## 2018-11-01 DIAGNOSIS — E1165 Type 2 diabetes mellitus with hyperglycemia: Secondary | ICD-10-CM

## 2018-11-25 ENCOUNTER — Ambulatory Visit: Payer: BLUE CROSS/BLUE SHIELD | Admitting: "Endocrinology

## 2018-12-02 LAB — COMPREHENSIVE METABOLIC PANEL
ALT: 21 IU/L (ref 0–32)
AST: 20 IU/L (ref 0–40)
Albumin/Globulin Ratio: 1.8 (ref 1.2–2.2)
Albumin: 4.4 g/dL (ref 3.8–4.8)
Alkaline Phosphatase: 42 IU/L (ref 39–117)
BILIRUBIN TOTAL: 0.8 mg/dL (ref 0.0–1.2)
BUN/Creatinine Ratio: 12 (ref 9–23)
BUN: 11 mg/dL (ref 6–24)
CALCIUM: 9.5 mg/dL (ref 8.7–10.2)
CO2: 22 mmol/L (ref 20–29)
Chloride: 93 mmol/L — ABNORMAL LOW (ref 96–106)
Creatinine, Ser: 0.89 mg/dL (ref 0.57–1.00)
GFR calc non Af Amer: 79 mL/min/{1.73_m2} (ref >59)
GFR, EST AFRICAN AMERICAN: 91 mL/min/{1.73_m2} (ref >59)
Globulin, Total: 2.5 g/dL (ref 1.5–4.5)
Glucose: 626 mg/dL (ref 65–99)
Potassium: 5.5 mmol/L — ABNORMAL HIGH (ref 3.5–5.2)
Sodium: 133 mmol/L — ABNORMAL LOW (ref 134–144)
TOTAL PROTEIN: 6.9 g/dL (ref 6.0–8.5)

## 2018-12-02 LAB — HEMOGLOBIN A1C
Est. average glucose Bld gHb Est-mCnc: 392 mg/dL
Hgb A1c MFr Bld: 15.3 % — ABNORMAL HIGH (ref 4.8–5.6)

## 2018-12-05 ENCOUNTER — Encounter: Payer: BLUE CROSS/BLUE SHIELD | Attending: Internal Medicine | Admitting: Nutrition

## 2018-12-05 ENCOUNTER — Ambulatory Visit (INDEPENDENT_AMBULATORY_CARE_PROVIDER_SITE_OTHER): Payer: BLUE CROSS/BLUE SHIELD | Admitting: "Endocrinology

## 2018-12-05 ENCOUNTER — Encounter: Payer: Self-pay | Admitting: "Endocrinology

## 2018-12-05 ENCOUNTER — Other Ambulatory Visit: Payer: Self-pay

## 2018-12-05 VITALS — BP 125/83 | HR 92 | Ht 65.0 in | Wt 161.0 lb

## 2018-12-05 DIAGNOSIS — Z9119 Patient's noncompliance with other medical treatment and regimen: Secondary | ICD-10-CM | POA: Insufficient documentation

## 2018-12-05 DIAGNOSIS — I10 Essential (primary) hypertension: Secondary | ICD-10-CM | POA: Diagnosis not present

## 2018-12-05 DIAGNOSIS — E559 Vitamin D deficiency, unspecified: Secondary | ICD-10-CM

## 2018-12-05 DIAGNOSIS — E1165 Type 2 diabetes mellitus with hyperglycemia: Secondary | ICD-10-CM

## 2018-12-05 DIAGNOSIS — IMO0002 Reserved for concepts with insufficient information to code with codable children: Secondary | ICD-10-CM

## 2018-12-05 DIAGNOSIS — E118 Type 2 diabetes mellitus with unspecified complications: Secondary | ICD-10-CM | POA: Insufficient documentation

## 2018-12-05 DIAGNOSIS — Z91199 Patient's noncompliance with other medical treatment and regimen due to unspecified reason: Secondary | ICD-10-CM

## 2018-12-05 MED ORDER — SITAGLIPTIN PHOS-METFORMIN HCL 50-1000 MG PO TABS: 1.0000 | ORAL_TABLET | Freq: Two times a day (BID) | ORAL | 2 refills | Status: DC

## 2018-12-05 MED ORDER — GLUCOSE BLOOD VI STRP: ORAL_STRIP | 3 refills | Status: DC

## 2018-12-05 NOTE — Progress Notes (Signed)
Endocrinology follow-up note   Subjective:    Patient ID: Diahanna Barbaro, female    DOB: Nov 20, 1970.  she is being seen in follow-up for management of currently uncontrolled symptomatic diabetes requested by  Craig Staggers, MD.   Past Medical History:  Diagnosis Date  . Diabetes mellitus without complication (HCC)   . Infection    bladder infection  . Ovarian cyst   . Vaginal Pap smear, abnormal    laser removal of abn cells; normal since  . Varicose veins during pregnancy, antepartum    Past Surgical History:  Procedure Laterality Date  . CERVICAL CERCLAGE N/A 10/12/2014   Procedure: CERCLAGE CERVICAL;  Surgeon: Turner Daniels, MD;  Location: [facility];  Service: Gynecology;  Laterality: N/A;  . OOPHORECTOMY    . OVARIAN CYST REMOVAL     Social History   Socioeconomic History  . Marital status: Married    Spouse name: Not on file  . Number of children: Not on file  . Years of education: Not on file  . Highest education level: Not on file  Occupational History  . Not on file  Social Needs  . Financial resource strain: Not on file  . Food insecurity:    Worry: Not on file    Inability: Not on file  . Transportation needs:    Medical: Not on file    Non-medical: Not on file  Tobacco Use  . Smoking status: Never Smoker  . Smokeless tobacco: Never Used  Substance and Sexual Activity  . Alcohol use: Yes    Comment: rare, none with pregnancy  . Drug use: No  . Sexual activity: Not Currently    Birth control/protection: Pill    Comment: on pill when found out pregnant  Lifestyle  . Physical activity:    Days per week: Not on file    Minutes per session: Not on file  . Stress: Not on file  Relationships  . Social connections:    Talks on phone: Not on file    Gets together: Not on file    Attends religious service: Not on file    Active member of club or organization: Not on file    Attends meetings of clubs or organizations: Not on file    Relationship  status: Not on file  Other Topics Concern  . Not on file  Social History Narrative  . Not on file   Outpatient Encounter Medications as of 12/05/2018  Medication Sig  . atorvastatin (LIPITOR) 10 MG tablet daily.  Marland Kitchen glucose blood (ONE TOUCH ULTRA TEST) test strip Use as instructed  . Insulin Detemir (LEVEMIR FLEXTOUCH) 100 UNIT/ML Pen Inject 50 Units into the skin daily at 10 pm.  . Insulin Pen Needle (B-D ULTRAFINE III SHORT PEN) 31G X 8 MM MISC 1 each by Does not apply route as directed.  Marland Kitchen LEVEMIR FLEXTOUCH 100 UNIT/ML Pen INJECT 50 UNITS INTO THE SKIN DAILY AT 10 PM.  . lisinopril (PRINIVIL,ZESTRIL) 10 MG tablet Take 10 mg by mouth daily.  . sitaGLIPtin-metformin (JANUMET) 50-1000 MG tablet Take 1 tablet by mouth 2 (two) times daily with a meal.  . Vitamin D, Ergocalciferol, (DRISDOL) 50000 units CAPS capsule Take 1 capsule (50,000 Units total) by mouth every 7 (seven) days.  . [DISCONTINUED] sitaGLIPtin-metformin (JANUMET) 50-1000 MG tablet Take 1 tablet by mouth 2 (two) times daily with a meal.   No facility-administered encounter medications on file as of 12/05/2018.     ALLERGIES: No Known Allergies  VACCINATION STATUS: Immunization History  Administered Date(s) Administered  . Tdap 01/22/2015    Diabetes  She presents for her follow-up diabetic visit. She has type 2 diabetes mellitus. Onset time: She was diagnosed at approximate age of 55 years. Her disease course has been worsening. There are no hypoglycemic associated symptoms. Pertinent negatives for hypoglycemia include no confusion, headaches, pallor or seizures. Associated symptoms include fatigue. Pertinent negatives for diabetes include no chest pain, no polydipsia, no polyphagia and no polyuria. There are no hypoglycemic complications. Symptoms are worsening. Risk factors for coronary artery disease include dyslipidemia, diabetes mellitus, family history and sedentary lifestyle. Current diabetic treatment includes  insulin injections and oral agent (dual therapy). She is compliant with treatment none of the time. Her weight is decreasing rapidly. She is following a generally unhealthy diet. When asked about meal planning, she reported none. She has had a previous visit with a dietitian. She participates in exercise intermittently. Her home blood glucose trend is increasing steadily. (She does not monitor her blood glucose lately.  Her recent labs show A1c of 15.3%.) An ACE inhibitor/angiotensin II receptor blocker is being taken. Eye exam is not current.  Hypertension  This is a chronic problem. The current episode started more than 1 year ago. Pertinent negatives include no chest pain, headaches, palpitations or shortness of breath. Risk factors for coronary artery disease include dyslipidemia, diabetes mellitus and sedentary lifestyle. Past treatments include ACE inhibitors.   Review of Systems  Constitutional: Positive for fatigue. Negative for chills, fever and unexpected weight change.  HENT: Negative for trouble swallowing and voice change.   Eyes: Negative for visual disturbance.  Respiratory: Negative for cough, shortness of breath and wheezing.   Cardiovascular: Negative for chest pain, palpitations and leg swelling.  Gastrointestinal: Negative for diarrhea, nausea and vomiting.  Endocrine: Negative for cold intolerance, heat intolerance, polydipsia, polyphagia and polyuria.  Musculoskeletal: Negative for arthralgias and myalgias.  Skin: Negative for color change, pallor, rash and wound.  Neurological: Negative for seizures and headaches.  Psychiatric/Behavioral: Negative for confusion and suicidal ideas.    Objective:    BP 125/83   Pulse 92   Ht 5\' 5"  (1.651 m)   Wt 161 lb (73 kg)   BMI 26.79 kg/m   Wt Readings from Last 3 Encounters:  12/05/18 161 lb (73 kg)  09/27/17 176 lb (79.8 kg)  09/27/17 176 lb (79.8 kg)     Physical Exam  Constitutional: She is oriented to person, place, and  time. She appears well-developed.  HENT:  Head: Normocephalic and atraumatic.  Eyes: EOM are normal.  Neck: Normal range of motion. Neck supple. No tracheal deviation present. No thyromegaly present.  Cardiovascular: Normal rate and regular rhythm.  Pulmonary/Chest: Effort normal and breath sounds normal.  Abdominal: Soft. Bowel sounds are normal. There is no abdominal tenderness. There is no guarding.  Musculoskeletal: Normal range of motion.        General: No edema.  Neurological: She is alert and oriented to person, place, and time. She has normal reflexes. No cranial nerve deficit. Coordination normal.  Skin: Skin is warm and dry. No rash noted. No erythema. No pallor.  Psychiatric: She has a normal mood and affect. Judgment normal.   CMP ( most recent) CMP     Component Value Date/Time   NA 133 (L) 12/01/2018 0902   K 5.5 (H) 12/01/2018 0902   CL 93 (L) 12/01/2018 0902   CO2 22 12/01/2018 0902   GLUCOSE 626 (HH) 12/01/2018 0902  GLUCOSE 79 01/14/2015 1720   BUN 11 12/01/2018 0902   CREATININE 0.89 12/01/2018 0902   CALCIUM 9.5 12/01/2018 0902   PROT 6.9 12/01/2018 0902   ALBUMIN 4.4 12/01/2018 0902   AST 20 12/01/2018 0902   ALT 21 12/01/2018 0902   ALKPHOS 42 12/01/2018 0902   BILITOT 0.8 12/01/2018 0902   GFRNONAA 79 12/01/2018 0902   GFRAA 91 12/01/2018 0902     Diabetic Labs (most recent): Lab Results  Component Value Date   HGBA1C 15.3 (H) 12/01/2018   HGBA1C 8.7 (H) 09/23/2017   HGBA1C 11.9 05/25/2017     Lipid Panel ( most recent) Lipid Panel     Component Value Date/Time   CHOL 135 05/25/2017   TRIG 106 05/25/2017   HDL 49 05/25/2017   LDLCALC 67 05/25/2017     Assessment & Plan:   1. Uncontrolled type 2 diabetes mellitus with hyperglycemia (HCC)  - Patient has currently uncontrolled symptomatic type 2 DM since  48 years of age. -After more than a year of absence from clinic, she returns with significant loss of control with A1c of 15.3%,  increasing from 8.7%.    - She does not report any gross competitions from her diabetes, however,  Syreeta Figler remains at a high risk for more acute and chronic complications which include CAD, CVA, CKD, retinopathy, and neuropathy. These are all discussed in detail with the patient.  - I have counseled her on diet management and weight loss, by adopting a carbohydrate restricted/protein rich diet.  - Patient admits there is a room for improvement in her diet and drink choices. -  Suggestion is made for her to avoid simple carbohydrates  from her diet including Cakes, Sweet Desserts / Pastries, Ice Cream, Soda (diet and regular), Sweet Tea, Candies, Chips, Cookies, Store Bought Juices, Alcohol in Excess of  1-2 drinks a day, Artificial Sweeteners, and "Sugar-free" Products. This will help patient to have stable blood glucose profile and potentially avoid unintended weight gain.  - I encouraged her to switch to  unprocessed or minimally processed complex starch and increased protein intake (animal or plant source), fruits, and vegetables.  - she is advised to stick to a routine mealtimes to eat 3 meals  a day and avoid unnecessary snacks ( to snack only to correct hypoglycemia).    - I have approached her with the following individualized plan to manage diabetes and patient agrees:   -Based on her presentation with significant loss of control, she will likely require multiple daily injections of insulin in order for her to achieve control of diabetes target.   -However, tragically, patient is not committed for proper monitoring of blood glucose for optimal use of insulin.   -She admits that she has not been monitoring blood glucose for several months.  Her previsit A1c was 15.3%.  -She is approached for better engagement and initiate strict monitoring of blood glucose 4 times a day-before meals and at bedtime.  -She is advised to resume Levemir 50 units nightly, and return in 1 week with her  meter and logs for evaluation.   - Patient is warned not to take insulin without proper monitoring per orders.  -She is advised to  call clinic for blood glucose levels less than 70 or above 300 mg /dl. -She will continue  Janumet 50/1000 mg by mouth twice a day, therapeutically suitable for patient .  - Patient specific target  A1c;  LDL, HDL, Triglycerides, and  Waist Circumference were  discussed in detail.  2) BP/HTN: Her blood pressure is controlled to target.  He is advised to continue current medications including lisinopril 10 mg p.o. daily.   3) Lipids/HPL: Her prior labs showed LDL of 67.  Patient is on atorvastatin 10 mg p.o. nightly.     4)  Weight/Diet: CDE Consult will be re-initiated , exercise, and detailed carbohydrates information provided.  5. Vitamin D deficiency- -He is status post treatment with ergocalciferol 50,000 units weekly for 12 weeks.   6) Chronic Care/Health Maintenance:  -she  is on ACEI medications and  is encouraged to continue to follow up with Ophthalmology, Dentist,  Podiatrist at least yearly or according to recommendations, and advised to   stay away from smoking. I have recommended yearly flu vaccine and pneumonia vaccination at least every 5 years; moderate intensity exercise for up to 150 minutes weekly; and  sleep for at least 7 hours a day.   - I advised patient to maintain close follow up with Craig StaggersVasireddy, Sabitha, MD for primary care needs.  - Time spent with the patient: 25 min, of which >50% was spent in reviewing her  current and  previous labs/studies, previous treatments, and medications doses and developing a plan for long-term care based on the latest recommendations for standards of care. Please refer to " Patient Self Inventory" in the Media  tab for reviewed elements of pertinent patient history. Liberty HandyWendy Murri participated in the discussions, expressed understanding, and voiced agreement with the above plans.  All questions were  answered to her satisfaction. she is encouraged to contact clinic should she have any questions or concerns prior to her return visit.  Follow up plan: - Return in about 1 week (around 12/12/2018) for Follow up with Meter and Logs Only - no Labs.  Marquis LunchGebre Mosie Angus, MD Phone: [phone]  Fax: [phone]   12/05/2018, 1:12 PM This note was partially dictated with voice recognition software. Similar sounding words can be transcribed inadequately or may not  be corrected upon review.

## 2018-12-05 NOTE — Patient Instructions (Signed)

## 2018-12-06 ENCOUNTER — Encounter: Payer: Self-pay | Admitting: Nutrition

## 2018-12-06 NOTE — Progress Notes (Signed)
  Medical Nutrition Therapy:  Appt start time: 1115end time:  1130  Assessment:  Primary concerns today: Diabetes Type 2. Here to see Dr. Fransico Him, Endocrinology also. PCP Dr. Phillips Odor.  Her A1C is up to 15.3%. Didn't keep follow up appts. Stopped taking insulin apparently. Has lost 16 lbs. Labs show her BS was > 600 mg/dl. She notes increased thirsty, frequent urination and weight loss. Hair is dry and brittle today. Dark sunken eyes today. She notes she doesn't eat on regular schedule.  Has been tired and not exercising.  Dr. Fransico Him increased Levemir to 50 units today from 40 units. . Takes Janumet twice a day. Needs more consistent changes with diet and consistency with taking medications as prescribed. .  Lab Results  Component Value Date   HGBA1C 15.3 (H) 12/01/2018    CMP Latest Ref Rng & Units 12/01/2018 09/23/2017 05/25/2017  Glucose 65 - 99 mg/dL 282(SU) 015(I) -  BUN 6 - 24 mg/dL 11 10 18   Creatinine 0.57 - 1.00 mg/dL 1.53 7.94 0.8  Sodium 327 - 144 mmol/L 133(L) 136 -  Potassium 3.5 - 5.2 mmol/L 5.5(H) 5.0 -  Chloride 96 - 106 mmol/L 93(L) 96 -  CO2 20 - 29 mmol/L 22 23 -  Calcium 8.7 - 10.2 mg/dL 9.5 9.5 -  Total Protein 6.0 - 8.5 g/dL 6.9 7.6 -  Total Bilirubin 0.0 - 1.2 mg/dL 0.8 0.8 -  Alkaline Phos 39 - 117 IU/L 42 35(L) -  AST 0 - 40 IU/L 20 22 -  ALT 0 - 32 IU/L 21 25 -     Preferred Learning Style:  No preference indicated   Learning Readiness:   Ready  Change in progress   MEDICATIONS: See list   DIETARY INTAKE:  Eats 2-3 meals. Sometimes isn't hungry at times.   Usual physical activity: Walking some.  Estimated energy needs: 1500  calories 170 g carbohydrates 112 g protein 42 g fat  Progress Towards Goal(s):  In progress.   Nutritional Diagnosis:  NB-1.1 Food and nutrition-related knowledge deficit As related to Diabetes Type 2.  As evidenced by A1C 11.9.    Intervention:  Nutrition and Diabetes education provided on My Plate, CHO counting, meal  planning, portion sizes, timing of meals, avoiding snacks between meals unless having a low blood sugar, target ranges for A1C and blood sugars, signs/symptoms and treatment of hyper/hypoglycemia, monitoring blood sugars, taking medications as prescribed, benefits of exercising 30 minutes per day and prevention of complications of DM.   Goals 1. Continue to increase exercise to 30 minutes a day.- 5 times per week. 2. Drink more water. 5 bottles per day Get A1C down to 7% Increase Levemir to 50 units a day.  Teaching Method Utilized:  Visual Auditory Hands on  Handouts given during visit include:  The Plate Method   Meal Plan Card  Diabetes LIving Well   Barriers to learning/adherence to lifestyle change: none  Demonstrated degree of understanding via:  Teach Back   Monitoring/Evaluation:  Dietary intake, exercise, meal planing, , and body weight in 3 month(s).

## 2018-12-06 NOTE — Patient Instructions (Addendum)
Goals 1. Take 50 units of Levemir nightly 2. Do not skip meals 3. Eat three meals per day 4. Eat 30-45 grams of carbs per meal Do not do the Keto diet-can increase risk of DKA. Drink only water Test blood sugars as instructed

## 2018-12-10 ENCOUNTER — Other Ambulatory Visit: Payer: Self-pay | Admitting: "Endocrinology

## 2018-12-12 ENCOUNTER — Telehealth (INDEPENDENT_AMBULATORY_CARE_PROVIDER_SITE_OTHER): Payer: BLUE CROSS/BLUE SHIELD | Admitting: "Endocrinology

## 2018-12-12 ENCOUNTER — Other Ambulatory Visit: Payer: Self-pay

## 2018-12-12 DIAGNOSIS — E1165 Type 2 diabetes mellitus with hyperglycemia: Secondary | ICD-10-CM

## 2018-12-12 DIAGNOSIS — E782 Mixed hyperlipidemia: Secondary | ICD-10-CM | POA: Diagnosis not present

## 2018-12-12 DIAGNOSIS — E559 Vitamin D deficiency, unspecified: Secondary | ICD-10-CM

## 2018-12-12 MED ORDER — INSULIN DETEMIR 100 UNIT/ML FLEXPEN: 60.0000 [IU] | PEN_INJECTOR | Freq: Every day | SUBCUTANEOUS | 2 refills | Status: DC

## 2018-12-12 NOTE — Patient Instructions (Addendum)
Endocrinology Telephone Visit Follow up Note -During COVID -19 Pandemic    Subjective:    Patient ID: [name], female    DOB: 1971/01/30.  This is a telephone visit for management of currently uncontrolled symptomatic type 2 diabetes due to the coronavirus pandemic.  Past Medical History:  Diagnosis Date  . Diabetes mellitus without complication (HCC)   . Infection    bladder infection  . Ovarian cyst   . Vaginal Pap smear, abnormal    laser removal of abn cells; normal since  . Varicose veins during pregnancy, antepartum    Past Surgical History:  Procedure Laterality Date  . CERVICAL CERCLAGE N/A 10/12/2014   Procedure: CERCLAGE CERVICAL;  Surgeon: Turner Daniels, MD;  Location: [facility];  Service: Gynecology;  Laterality: N/A;  . OOPHORECTOMY    . OVARIAN CYST REMOVAL     Social History   Socioeconomic History  . Marital status: Married    Spouse name: Not on file  . Number of children: Not on file  . Years of education: Not on file  . Highest education level: Not on file  Occupational History  . Not on file  Social Needs  . Financial resource strain: Not on file  . Food insecurity:    Worry: Not on file    Inability: Not on file  . Transportation needs:    Medical: Not on file    Non-medical: Not on file  Tobacco Use  . Smoking status: Never Smoker  . Smokeless tobacco: Never Used  Substance and Sexual Activity  . Alcohol use: Yes    Comment: rare, none with pregnancy  . Drug use: No  . Sexual activity: Not Currently    Birth control/protection: Pill    Comment: on pill when found out pregnant  Lifestyle  . Physical activity:    Days per week: Not on file    Minutes per session: Not on file  . Stress: Not on file  Relationships  . Social connections:    Talks on phone: Not on file    Gets together: Not on file    Attends religious service: Not on file    Active member of club or organization:  Not on file    Attends meetings of clubs or organizations: Not on file    Relationship status: Not on file  Other Topics Concern  . Not on file  Social History Narrative  . Not on file   Outpatient Encounter Medications as of 12/12/2018  Medication Sig  . atorvastatin (LIPITOR) 10 MG tablet daily.  . B-D ULTRAFINE III SHORT PEN 31G X 8 MM MISC AS DIRECTED  . glucose blood (ONE TOUCH ULTRA TEST) test strip Use as instructed  . Insulin Detemir (LEVEMIR FLEXTOUCH) 100 UNIT/ML Pen Inject 60 Units into the skin at bedtime.  Marland Kitchen lisinopril (PRINIVIL,ZESTRIL) 10 MG tablet Take 10 mg by mouth daily.  . sitaGLIPtin-metformin (JANUMET) 50-1000 MG tablet Take 1 tablet by mouth 2 (two) times daily with a meal.  . Vitamin D, Ergocalciferol, (DRISDOL) 50000 units CAPS capsule Take 1 capsule (50,000 Units total) by mouth every 7 (seven) days.  . [DISCONTINUED] Insulin Detemir (LEVEMIR FLEXTOUCH) 100 UNIT/ML Pen Inject  50 Units into the skin daily at 10 pm.  . [DISCONTINUED] LEVEMIR FLEXTOUCH 100 UNIT/ML Pen INJECT 50 UNITS INTO THE SKIN DAILY AT 10 PM.   No facility-administered encounter medications on file as of 12/12/2018.     ALLERGIES: No Known Allergies  VACCINATION STATUS: Immunization History  Administered Date(s) Administered  . Tdap 01/22/2015    Diabetes  She presents for her follow-up diabetic visit. She has type 2 diabetes mellitus. Onset time: She was diagnosed at approximate age of 36 years.   -Prior to her last visit, her disease course has been worsening. He was resumed on basal insulin with Levemir and Janumet.  She reports significant improvement in her glycemic profile as well as clinical symptoms.  She reports that her morning numbers are ranging between 160 and 211, lunchtime readings between 105 and 295, suppertime readings between 160 and 293.  No reported or documented hypoglycemia.  Her most recent A1c was 15.3%.  An ACE inhibitor/angiotensin II receptor blocker is being  taken. Eye exam is not current.    Objective:    Outpatient Encounter Medications as of 12/12/2018  Medication Sig  . atorvastatin (LIPITOR) 10 MG tablet daily.  . B-D ULTRAFINE III SHORT PEN 31G X 8 MM MISC AS DIRECTED  . glucose blood (ONE TOUCH ULTRA TEST) test strip Use as instructed  . Insulin Detemir (LEVEMIR FLEXTOUCH) 100 UNIT/ML Pen Inject 60 Units into the skin at bedtime.  Marland Kitchen lisinopril (PRINIVIL,ZESTRIL) 10 MG tablet Take 10 mg by mouth daily.  . sitaGLIPtin-metformin (JANUMET) 50-1000 MG tablet Take 1 tablet by mouth 2 (two) times daily with a meal.  . Vitamin D, Ergocalciferol, (DRISDOL) 50000 units CAPS capsule Take 1 capsule (50,000 Units total) by mouth every 7 (seven) days.  . [DISCONTINUED] Insulin Detemir (LEVEMIR FLEXTOUCH) 100 UNIT/ML Pen Inject 50 Units into the skin daily at 10 pm.  . [DISCONTINUED] LEVEMIR FLEXTOUCH 100 UNIT/ML Pen INJECT 50 UNITS INTO THE SKIN DAILY AT 10 PM.   No facility-administered encounter medications on file as of 12/12/2018.      CMP ( most recent) CMP     Component Value Date/Time   NA 133 (L) 12/01/2018 0902   K 5.5 (H) 12/01/2018 0902   CL 93 (L) 12/01/2018 0902   CO2 22 12/01/2018 0902   GLUCOSE 626 (HH) 12/01/2018 0902   GLUCOSE 79 01/14/2015 1720   BUN 11 12/01/2018 0902   CREATININE 0.89 12/01/2018 0902   CALCIUM 9.5 12/01/2018 0902   PROT 6.9 12/01/2018 0902   ALBUMIN 4.4 12/01/2018 0902   AST 20 12/01/2018 0902   ALT 21 12/01/2018 0902   ALKPHOS 42 12/01/2018 0902   BILITOT 0.8 12/01/2018 0902   GFRNONAA 79 12/01/2018 0902   GFRAA 91 12/01/2018 0902     Diabetic Labs (most recent): Lab Results  Component Value Date   HGBA1C 15.3 (H) 12/01/2018   HGBA1C 8.7 (H) 09/23/2017   HGBA1C 11.9 05/25/2017     Lipid Panel ( most recent) Lipid Panel     Component Value Date/Time   CHOL 135 05/25/2017   TRIG 106 05/25/2017   HDL 49 05/25/2017   LDLCALC 67 05/25/2017     Assessment & Plan:   1. Uncontrolled  type 2 diabetes mellitus with hyperglycemia (HCC)  - Patient has currently uncontrolled symptomatic type 2 DM since  48 years of age. -She reports near target glycemic profile since she resumed her basal insulin and Janumet treatment.  Her most recent A1c was 15.3%.    -  She does not report any gross competitions from her diabetes, however,  Keriann Gallogly remains at a high risk for more acute and chronic complications which include CAD, CVA, CKD, retinopathy, and neuropathy. These are all discussed in detail with the patient.  - I have counseled her on diet management and weight loss, by adopting a carbohydrate restricted/protein rich diet. - Patient admits there is a room for improvement in her diet and drink choices. -  Suggestion is made for her to avoid simple carbohydrates  from her diet including Cakes, Sweet Desserts / Pastries, Ice Cream, Soda (diet and regular), Sweet Tea, Candies, Chips, Cookies, Store Bought Juices, Alcohol in Excess of  1-2 drinks a day, Artificial Sweeteners, and "Sugar-free" Products. This will help patient to have stable blood glucose profile and potentially avoid unintended weight gain.   - I encouraged her to switch to  unprocessed or minimally processed complex starch and increased protein intake (animal or plant source), fruits, and vegetables.  - she is advised to stick to a routine mealtimes to eat 3 meals  a day and avoid unnecessary snacks ( to snack only to correct hypoglycemia).    - I have approached her with the following individualized plan to manage diabetes and patient agrees:  -She is advised to increase Levemir to 60  units nightly, continue Janumet 50/1000 mg by mouth twice a day, therapeutically suitable for patient . -She is advised to continue monitoring blood glucose 2 times a day-daily before breakfast and at bedtime. -She is advised to call clinic for blood glucose less than 70 or greater than 200 mg per DL 3 days in a week.  2)  Lipids/HPL: Her prior labs showed LDL of 67.  She is advised to continue atorvastatin 10 mg p.o. nightly.     5. Vitamin D deficiency- -He is status post treatment with ergocalciferol 50,000 units weekly for 12 weeks.   - I advised patient to maintain close follow up with Craig Staggers, MD for primary care needs.  - Time spent with the patient: 25 min, of which >50% was spent in reviewing her blood glucose logs , discussing her hypoglycemia and hyperglycemia episodes, reviewing her current and  previous labs / studies and medications  doses and developing a plan to avoid hypoglycemia and hyperglycemia.   [name] participated in the discussions, expressed understanding, and voiced agreement with the above plans.  All questions were answered to her satisfaction. she is encouraged to contact clinic should she have any questions or concerns prior to her return visit.   Follow up plan: -Return in 3 months with meter, logs, and previsit labs. Marquis Lunch, MD Phone: [phone]  Fax: [phone]   12/12/2018, 3:15 PM This note was partially dictated with voice recognition software. Similar sounding words can be transcribed inadequately or may not  be corrected upon review.

## 2018-12-12 NOTE — Progress Notes (Signed)
                                                    Endocrinology Telephone Visit Follow up Note -During COVID -19 Pandemic    Subjective:    Patient ID: [name], female    DOB: 07/12/1971.  This is a telephone visit for management of currently uncontrolled symptomatic type 2 diabetes due to the coronavirus pandemic.  Past Medical History:  Diagnosis Date  . Diabetes mellitus without complication (HCC)   . Infection    bladder infection  . Ovarian cyst   . Vaginal Pap smear, abnormal    laser removal of abn cells; normal since  . Varicose veins during pregnancy, antepartum    Past Surgical History:  Procedure Laterality Date  . CERVICAL CERCLAGE N/A 10/12/2014   Procedure: CERCLAGE CERVICAL;  Surgeon: David C Lowe, MD;  Location: [facility];  Service: Gynecology;  Laterality: N/A;  . OOPHORECTOMY    . OVARIAN CYST REMOVAL     Social History   Socioeconomic History  . Marital status: Married    Spouse name: Not on file  . Number of children: Not on file  . Years of education: Not on file  . Highest education level: Not on file  Occupational History  . Not on file  Social Needs  . Financial resource strain: Not on file  . Food insecurity:    Worry: Not on file    Inability: Not on file  . Transportation needs:    Medical: Not on file    Non-medical: Not on file  Tobacco Use  . Smoking status: Never Smoker  . Smokeless tobacco: Never Used  Substance and Sexual Activity  . Alcohol use: Yes    Comment: rare, none with pregnancy  . Drug use: No  . Sexual activity: Not Currently    Birth control/protection: Pill    Comment: on pill when found out pregnant  Lifestyle  . Physical activity:    Days per week: Not on file    Minutes per session: Not on file  . Stress: Not on file  Relationships  . Social connections:    Talks on phone: Not on file    Gets together: Not on file    Attends religious service: Not on file    Active member of club or organization:  Not on file    Attends meetings of clubs or organizations: Not on file    Relationship status: Not on file  Other Topics Concern  . Not on file  Social History Narrative  . Not on file   Outpatient Encounter Medications as of 12/12/2018  Medication Sig  . atorvastatin (LIPITOR) 10 MG tablet daily.  . B-D ULTRAFINE III SHORT PEN 31G X 8 MM MISC AS DIRECTED  . glucose blood (ONE TOUCH ULTRA TEST) test strip Use as instructed  . Insulin Detemir (LEVEMIR FLEXTOUCH) 100 UNIT/ML Pen Inject 60 Units into the skin at bedtime.  . lisinopril (PRINIVIL,ZESTRIL) 10 MG tablet Take 10 mg by mouth daily.  . sitaGLIPtin-metformin (JANUMET) 50-1000 MG tablet Take 1 tablet by mouth 2 (two) times daily with a meal.  . Vitamin D, Ergocalciferol, (DRISDOL) 50000 units CAPS capsule Take 1 capsule (50,000 Units total) by mouth every 7 (seven) days.  . [DISCONTINUED] Insulin Detemir (LEVEMIR FLEXTOUCH) 100 UNIT/ML Pen Inject   50 Units into the skin daily at 10 pm.  . [DISCONTINUED] LEVEMIR FLEXTOUCH 100 UNIT/ML Pen INJECT 50 UNITS INTO THE SKIN DAILY AT 10 PM.   No facility-administered encounter medications on file as of 12/12/2018.     ALLERGIES: No Known Allergies  VACCINATION STATUS: Immunization History  Administered Date(s) Administered  . Tdap 01/22/2015    Diabetes  She presents for her follow-up diabetic visit. She has type 2 diabetes mellitus. Onset time: She was diagnosed at approximate age of 40 years.   -Prior to her last visit, her disease course has been worsening. He was resumed on basal insulin with Levemir and Janumet.  She reports significant improvement in her glycemic profile as well as clinical symptoms.  She reports that her morning numbers are ranging between 160 and 211, lunchtime readings between 105 and 295, suppertime readings between 160 and 293.  No reported or documented hypoglycemia.  Her most recent A1c was 15.3%.  An ACE inhibitor/angiotensin II receptor blocker is being  taken. Eye exam is not current.    Objective:    Outpatient Encounter Medications as of 12/12/2018  Medication Sig  . atorvastatin (LIPITOR) 10 MG tablet daily.  . B-D ULTRAFINE III SHORT PEN 31G X 8 MM MISC AS DIRECTED  . glucose blood (ONE TOUCH ULTRA TEST) test strip Use as instructed  . Insulin Detemir (LEVEMIR FLEXTOUCH) 100 UNIT/ML Pen Inject 60 Units into the skin at bedtime.  . lisinopril (PRINIVIL,ZESTRIL) 10 MG tablet Take 10 mg by mouth daily.  . sitaGLIPtin-metformin (JANUMET) 50-1000 MG tablet Take 1 tablet by mouth 2 (two) times daily with a meal.  . Vitamin D, Ergocalciferol, (DRISDOL) 50000 units CAPS capsule Take 1 capsule (50,000 Units total) by mouth every 7 (seven) days.  . [DISCONTINUED] Insulin Detemir (LEVEMIR FLEXTOUCH) 100 UNIT/ML Pen Inject 50 Units into the skin daily at 10 pm.  . [DISCONTINUED] LEVEMIR FLEXTOUCH 100 UNIT/ML Pen INJECT 50 UNITS INTO THE SKIN DAILY AT 10 PM.   No facility-administered encounter medications on file as of 12/12/2018.      CMP ( most recent) CMP     Component Value Date/Time   NA 133 (L) 12/01/2018 0902   K 5.5 (H) 12/01/2018 0902   CL 93 (L) 12/01/2018 0902   CO2 22 12/01/2018 0902   GLUCOSE 626 (HH) 12/01/2018 0902   GLUCOSE 79 01/14/2015 1720   BUN 11 12/01/2018 0902   CREATININE 0.89 12/01/2018 0902   CALCIUM 9.5 12/01/2018 0902   PROT 6.9 12/01/2018 0902   ALBUMIN 4.4 12/01/2018 0902   AST 20 12/01/2018 0902   ALT 21 12/01/2018 0902   ALKPHOS 42 12/01/2018 0902   BILITOT 0.8 12/01/2018 0902   GFRNONAA 79 12/01/2018 0902   GFRAA 91 12/01/2018 0902     Diabetic Labs (most recent): Lab Results  Component Value Date   HGBA1C 15.3 (H) 12/01/2018   HGBA1C 8.7 (H) 09/23/2017   HGBA1C 11.9 05/25/2017     Lipid Panel ( most recent) Lipid Panel     Component Value Date/Time   CHOL 135 05/25/2017   TRIG 106 05/25/2017   HDL 49 05/25/2017   LDLCALC 67 05/25/2017     Assessment & Plan:   1. Uncontrolled  type 2 diabetes mellitus with hyperglycemia (HCC)  - Patient has currently uncontrolled symptomatic type 2 DM since  48 years of age. -She reports near target glycemic profile since she resumed her basal insulin and Janumet treatment.  Her most recent A1c was 15.3%.    -   She does not report any gross competitions from her diabetes, however,  [name] remains at a high risk for more acute and chronic complications which include CAD, CVA, CKD, retinopathy, and neuropathy. These are all discussed in detail with the patient.  - I have counseled her on diet management and weight loss, by adopting a carbohydrate restricted/protein rich diet. - Patient admits there is a room for improvement in her diet and drink choices. -  Suggestion is made for her to avoid simple carbohydrates  from her diet including Cakes, Sweet Desserts / Pastries, Ice Cream, Soda (diet and regular), Sweet Tea, Candies, Chips, Cookies, Store Bought Juices, Alcohol in Excess of  1-2 drinks a day, Artificial Sweeteners, and "Sugar-free" Products. This will help patient to have stable blood glucose profile and potentially avoid unintended weight gain.   - I encouraged her to switch to  unprocessed or minimally processed complex starch and increased protein intake (animal or plant source), fruits, and vegetables.  - she is advised to stick to a routine mealtimes to eat 3 meals  a day and avoid unnecessary snacks ( to snack only to correct hypoglycemia).    - I have approached her with the following individualized plan to manage diabetes and patient agrees:  -She is advised to increase Levemir to 60  units nightly, continue Janumet 50/1000 mg by mouth twice a day, therapeutically suitable for patient . -She is advised to continue monitoring blood glucose 2 times a day-daily before breakfast and at bedtime. -She is advised to call clinic for blood glucose less than 70 or greater than 200 mg per DL 3 days in a week.  2)  Lipids/HPL: Her prior labs showed LDL of 67.  She is advised to continue atorvastatin 10 mg p.o. nightly.     5. Vitamin D deficiency- -He is status post treatment with ergocalciferol 50,000 units weekly for 12 weeks.   - I advised patient to maintain close follow up with Vasireddy, Sabitha, MD for primary care needs.  - Time spent with the patient: 25 min, of which >50% was spent in reviewing her blood glucose logs , discussing her hypoglycemia and hyperglycemia episodes, reviewing her current and  previous labs / studies and medications  doses and developing a plan to avoid hypoglycemia and hyperglycemia.   [name] participated in the discussions, expressed understanding, and voiced agreement with the above plans.  All questions were answered to her satisfaction. she is encouraged to contact clinic should she have any questions or concerns prior to her return visit.   Follow up plan: -Return in 3 months with meter, logs, and previsit labs. Gina Socorro Kanitz, MD Phone: [phone]  Fax: [phone]   12/12/2018, 3:15 PM This note was partially dictated with voice recognition software. Similar sounding words can be transcribed inadequately or may not  be corrected upon review. 

## 2018-12-13 ENCOUNTER — Encounter: Payer: Self-pay | Admitting: Nutrition

## 2018-12-13 ENCOUNTER — Encounter: Payer: BLUE CROSS/BLUE SHIELD | Attending: Internal Medicine | Admitting: Nutrition

## 2018-12-13 DIAGNOSIS — E118 Type 2 diabetes mellitus with unspecified complications: Secondary | ICD-10-CM | POA: Insufficient documentation

## 2018-12-13 DIAGNOSIS — E1165 Type 2 diabetes mellitus with hyperglycemia: Secondary | ICD-10-CM | POA: Insufficient documentation

## 2018-12-13 DIAGNOSIS — IMO0002 Reserved for concepts with insufficient information to code with codable children: Secondary | ICD-10-CM

## 2018-12-13 NOTE — Patient Instructions (Signed)
Goals Be sure to stick to meal times Takes meds as prescribed Get FBS less than 150 mg/dl. Exercising 30 minutes three times per week

## 2018-12-13 NOTE — Progress Notes (Signed)
Telephone contact due to Covid 19. DM follow up  She notes her BS are coming down. She is now eating breakfast. 'Avoiding snacking. Not hungry between. Had contact Dr. Fransico Him yesterday. Testing Twice a day.    FBS 160- 247   Bedtime: 200-240's. Levermir was increased to 60 units a day now.  Jamument twice a day. Feels much better. B) 3/4 special k with berries, 1 cup milk, 1/2 apple or LS oatmeal with  Nuts.and apple. L) Salad and grilled chicken, or hamburger appty with steamed veggies.  Or salad sweet potatoe D) veggies and rice. , water or unsweet teal  Goals Be sure to stick to meal times Takes meds as prescribed Get FBS less than 150 mg/dl. Exercising 30 minutes three times per week  Follow up DM in 3 month.

## 2018-12-20 ENCOUNTER — Telehealth: Payer: Self-pay

## 2018-12-20 NOTE — Telephone Encounter (Signed)
When blood glucose changes rapidly it may cause blurred vision, will improve progressively unless she is having blood glucose readings below 70, has  to report to [place].

## 2018-12-20 NOTE — Telephone Encounter (Signed)
[name] is calling stating that since she started insulin she is having blurred vision, she is asking if this is a normal reaction, Please advise?

## 2018-12-22 NOTE — Telephone Encounter (Signed)
Patient is aware of the recommendation 

## 2019-03-03 ENCOUNTER — Other Ambulatory Visit: Payer: Self-pay | Admitting: "Endocrinology

## 2019-03-10 ENCOUNTER — Other Ambulatory Visit: Payer: Self-pay | Admitting: "Endocrinology

## 2019-03-11 LAB — COMPREHENSIVE METABOLIC PANEL
ALT: 18 IU/L (ref 0–32)
AST: 12 IU/L (ref 0–40)
Albumin/Globulin Ratio: 1.6 (ref 1.2–2.2)
Albumin: 4.8 g/dL (ref 3.8–4.8)
Alkaline Phosphatase: 31 IU/L — ABNORMAL LOW (ref 39–117)
BUN/Creatinine Ratio: 11 (ref 9–23)
BUN: 8 mg/dL (ref 6–24)
Bilirubin Total: 1.1 mg/dL (ref 0.0–1.2)
CO2: 24 mmol/L (ref 20–29)
Calcium: 9.7 mg/dL (ref 8.7–10.2)
Chloride: 100 mmol/L (ref 96–106)
Creatinine, Ser: 0.75 mg/dL (ref 0.57–1.00)
GFR calc Af Amer: 111 mL/min/{1.73_m2} (ref >59)
GFR calc non Af Amer: 97 mL/min/{1.73_m2} (ref >59)
Globulin, Total: 3 g/dL (ref 1.5–4.5)
Glucose: 182 mg/dL — ABNORMAL HIGH (ref 65–99)
Potassium: 4.7 mmol/L (ref 3.5–5.2)
Sodium: 136 mmol/L (ref 134–144)
Total Protein: 7.8 g/dL (ref 6.0–8.5)

## 2019-03-11 LAB — HGB A1C W/O EAG: Hgb A1c MFr Bld: 9.8 % — ABNORMAL HIGH (ref 4.8–5.6)

## 2019-03-14 ENCOUNTER — Other Ambulatory Visit: Payer: Self-pay

## 2019-03-14 ENCOUNTER — Encounter: Payer: Self-pay | Admitting: "Endocrinology

## 2019-03-14 ENCOUNTER — Encounter: Payer: BC Managed Care – PPO | Attending: Internal Medicine | Admitting: Nutrition

## 2019-03-14 ENCOUNTER — Encounter: Payer: Self-pay | Admitting: Nutrition

## 2019-03-14 ENCOUNTER — Ambulatory Visit (INDEPENDENT_AMBULATORY_CARE_PROVIDER_SITE_OTHER): Payer: BC Managed Care – PPO | Admitting: "Endocrinology

## 2019-03-14 VITALS — BP 124/87 | HR 95 | Ht 65.0 in | Wt 169.0 lb

## 2019-03-14 DIAGNOSIS — E1165 Type 2 diabetes mellitus with hyperglycemia: Secondary | ICD-10-CM | POA: Diagnosis not present

## 2019-03-14 DIAGNOSIS — E782 Mixed hyperlipidemia: Secondary | ICD-10-CM | POA: Diagnosis not present

## 2019-03-14 DIAGNOSIS — I10 Essential (primary) hypertension: Secondary | ICD-10-CM | POA: Diagnosis not present

## 2019-03-14 MED ORDER — LEVEMIR FLEXTOUCH 100 UNIT/ML ~~LOC~~ SOPN: 70.0000 [IU] | PEN_INJECTOR | Freq: Every day | SUBCUTANEOUS | 2 refills | Status: DC

## 2019-03-14 NOTE — Patient Instructions (Signed)

## 2019-03-14 NOTE — Patient Instructions (Signed)
Goals  Eat 30-45 grams of carbs per meal Keep increasing exercise. Drink water only. Get FBS less than 150 mg/dl in am and before bed. A1C goal is 7

## 2019-03-14 NOTE — Progress Notes (Signed)
Medical Nutrition Therapy:  Appt start time: 1640 end time:  1700 Telephone visit.  Assessment:  Primary concerns today: Diabetes Type 2.  Just saw Dr.Nida today. A1C is doing better. She is counting carbs, watching what she is eating and being more active outside. Feels much better. Eating more fresh fruits and vegetables and drinking only water. Levemir 70 units a day, Janument 50/1000 BID. She is doing much better. Testing twice a day.  Lab Results  Component Value Date   HGBA1C 9.8 (H) 03/10/2019   CMP Latest Ref Rng & Units 03/10/2019 12/01/2018 09/23/2017  Glucose 65 - 99 mg/dL 182(H) 626(HH) 175(H)  BUN 6 - 24 mg/dL 8 11 10   Creatinine 0.57 - 1.00 mg/dL 0.75 0.89 0.85  Sodium 134 - 144 mmol/L 136 133(L) 136  Potassium 3.5 - 5.2 mmol/L 4.7 5.5(H) 5.0  Chloride 96 - 106 mmol/L 100 93(L) 96  CO2 20 - 29 mmol/L 24 22 23   Calcium 8.7 - 10.2 mg/dL 9.7 9.5 9.5  Total Protein 6.0 - 8.5 g/dL 7.8 6.9 7.6  Total Bilirubin 0.0 - 1.2 mg/dL 1.1 0.8 0.8  Alkaline Phos 39 - 117 IU/L 31(L) 42 35(L)  AST 0 - 40 IU/L 12 20 22   ALT 0 - 32 IU/L 18 21 25   Preferred Learning Style:  No preference indicated   Learning Readiness:   Ready  Change in progress   MEDICATIONS: See list   DIETARY INTAKE:  B) Oatmeal or eggs, bacon and fruit L) Chicken salad sandwich, apple and veggie sticks, water D) Grilled chicken, grilled veggies, fruit-berries and water    Usual physical activity: Walking and swimming   Estimated energy needs: 1500  calories 170 g carbohydrates 112 g protein 42 g fat  Progress Towards Goal(s):  In progress.   Nutritional Diagnosis:  NB-1.1 Food and nutrition-related knowledge deficit As related to Diabetes Type 2.  As evidenced by A1C 11.9.    Intervention:  Nutrition and Diabetes education provided on My Plate, CHO counting, meal planning, portion sizes, timing of meals, avoiding snacks between meals unless having a low blood sugar, target ranges for A1C and blood  sugars, signs/symptoms and treatment of hyper/hypoglycemia, monitoring blood sugars, taking medications as prescribed, benefits of exercising 30 minutes per day and prevention of complications of DM.  Goals .Goals  Eat 30-45 grams of carbs per meal Keep increasing exercise. Drink water only. Get FBS less than 150 mg/dl in am and before bed. A1C goal is 7%  Teaching Method Utilized:  Visual Auditory Hands on  Handouts given during visit include:  The Plate Method   Meal Plan Card  Diabetes LIving Well   Barriers to learning/adherence to lifestyle change: none  Demonstrated degree of understanding via:  Teach Back   Monitoring/Evaluation:  Dietary intake, exercise, meal planing, , and body weight in 3 month(s)    Follow up DM in 3 month.

## 2019-03-14 NOTE — Progress Notes (Signed)
Endocrinology follow-up note   Subjective:    Patient ID: [name], female    DOB: 05/19/1971.  she is being seen in follow-up for management of currently uncontrolled symptomatic diabetes requested by  Craig StaggersVasireddy, Sabitha, MD.   Past Medical History:  Diagnosis Date  . Diabetes mellitus without complication (HCC)   . Infection    bladder infection  . Ovarian cyst   . Vaginal Pap smear, abnormal    laser removal of abn cells; normal since  . Varicose veins during pregnancy, antepartum    Past Surgical History:  Procedure Laterality Date  . CERVICAL CERCLAGE N/A 10/12/2014   Procedure: CERCLAGE CERVICAL;  Surgeon: Turner Danielsavid C Lowe, MD;  Location: [facility];  Service: Gynecology;  Laterality: N/A;  . OOPHORECTOMY    . OVARIAN CYST REMOVAL     Social History   Socioeconomic History  . Marital status: Married    Spouse name: Not on file  . Number of children: Not on file  . Years of education: Not on file  . Highest education level: Not on file  Occupational History  . Not on file  Social Needs  . Financial resource strain: Not on file  . Food insecurity    Worry: Not on file    Inability: Not on file  . Transportation needs    Medical: Not on file    Non-medical: Not on file  Tobacco Use  . Smoking status: Never Smoker  . Smokeless tobacco: Never Used  Substance and Sexual Activity  . Alcohol use: Yes    Comment: rare, none with pregnancy  . Drug use: No  . Sexual activity: Not Currently    Birth control/protection: Pill    Comment: on pill when found out pregnant  Lifestyle  . Physical activity    Days per week: Not on file    Minutes per session: Not on file  . Stress: Not on file  Relationships  . Social Musicianconnections    Talks on phone: Not on file    Gets together: Not on file    Attends religious service: Not on file    Active member of club or organization: Not on file    Attends meetings of clubs or organizations: Not on file    Relationship status:  Not on file  Other Topics Concern  . Not on file  Social History Narrative  . Not on file   Outpatient Encounter Medications as of 03/14/2019  Medication Sig  . atorvastatin (LIPITOR) 10 MG tablet daily.  . B-D ULTRAFINE III SHORT PEN 31G X 8 MM MISC AS DIRECTED  . glucose blood (ONE TOUCH ULTRA TEST) test strip Use as instructed  . Insulin Detemir (LEVEMIR FLEXTOUCH) 100 UNIT/ML Pen Inject 70 Units into the skin at bedtime.  Marland Kitchen. JANUMET 50-1000 MG tablet TAKE 1 TABLET BY MOUTH TWICE A DAY  . lisinopril (PRINIVIL,ZESTRIL) 10 MG tablet Take 10 mg by mouth daily.  . Vitamin D, Ergocalciferol, (DRISDOL) 50000 units CAPS capsule Take 1 capsule (50,000 Units total) by mouth every 7 (seven) days.  . [DISCONTINUED] Insulin Detemir (LEVEMIR FLEXTOUCH) 100 UNIT/ML Pen Inject 60 Units into the skin at bedtime.   No facility-administered encounter medications on file as of 03/14/2019.     ALLERGIES: No Known Allergies  VACCINATION STATUS: Immunization History  Administered Date(s) Administered  . Tdap 01/22/2015    Diabetes She presents for her follow-up diabetic visit. She has type 2 diabetes mellitus. Onset time: She was diagnosed at approximate  age of 48 years. Her disease course has been worsening. There are no hypoglycemic associated symptoms. Pertinent negatives for hypoglycemia include no confusion, headaches, pallor or seizures. Associated symptoms include fatigue. Pertinent negatives for diabetes include no chest pain, no polydipsia, no polyphagia and no polyuria. There are no hypoglycemic complications. Symptoms are worsening. Risk factors for coronary artery disease include dyslipidemia, diabetes mellitus, family history and sedentary lifestyle. Current diabetic treatment includes insulin injections and oral agent (dual therapy). She is compliant with treatment none of the time. Her weight is fluctuating minimally. She is following a generally unhealthy diet. When asked about meal planning,  she reported none. She has had a previous visit with a dietitian. She participates in exercise intermittently. Her home blood glucose trend is decreasing steadily. Her breakfast blood glucose range is generally 180-200 mg/dl. Her bedtime blood glucose range is generally >200 mg/dl. Her overall blood glucose range is 180-200 mg/dl. An ACE inhibitor/angiotensin II receptor blocker is being taken. Eye exam is not current.  Hypertension This is a chronic problem. The current episode started more than 1 year ago. Pertinent negatives include no chest pain, headaches, palpitations or shortness of breath. Risk factors for coronary artery disease include dyslipidemia, diabetes mellitus and sedentary lifestyle. Past treatments include ACE inhibitors.   Review of Systems  Constitutional: Positive for fatigue. Negative for chills, fever and unexpected weight change.  HENT: Negative for trouble swallowing and voice change.   Eyes: Negative for visual disturbance.  Respiratory: Negative for cough, shortness of breath and wheezing.   Cardiovascular: Negative for chest pain, palpitations and leg swelling.  Gastrointestinal: Negative for diarrhea, nausea and vomiting.  Endocrine: Negative for cold intolerance, heat intolerance, polydipsia, polyphagia and polyuria.  Musculoskeletal: Negative for arthralgias and myalgias.  Skin: Negative for color change, pallor, rash and wound.  Neurological: Negative for seizures and headaches.  Psychiatric/Behavioral: Negative for confusion and suicidal ideas.    Objective:    BP 124/87 (BP Location: Left Arm, Patient Position: Sitting, Cuff Size: Normal)   Pulse 95   Ht 5\' 5"  (1.651 m)   Wt 169 lb (76.7 kg)   SpO2 99%   BMI 28.12 kg/m   Wt Readings from Last 3 Encounters:  03/14/19 169 lb (76.7 kg)  12/05/18 161 lb (73 kg)  09/27/17 176 lb (79.8 kg)     Physical Exam  Constitutional: She is oriented to person, place, and time. She appears well-developed.  HENT:   Head: Normocephalic and atraumatic.  Eyes: EOM are normal.  Neck: Normal range of motion. Neck supple. No tracheal deviation present. No thyromegaly present.  Cardiovascular: Normal rate and regular rhythm.  Pulmonary/Chest: Effort normal and breath sounds normal.  Abdominal: There is no abdominal tenderness. There is no guarding.  Musculoskeletal: Normal range of motion.        General: No edema.  Neurological: She is alert and oriented to person, place, and time. She has normal reflexes. No cranial nerve deficit. Coordination normal.  Skin: Skin is warm and dry. No rash noted. No erythema. No pallor.  Psychiatric: She has a normal mood and affect. Judgment normal.   CMP ( most recent) CMP     Component Value Date/Time   NA 136 03/10/2019 1354   K 4.7 03/10/2019 1354   CL 100 03/10/2019 1354   CO2 24 03/10/2019 1354   GLUCOSE 182 (H) 03/10/2019 1354   GLUCOSE 79 01/14/2015 1720   BUN 8 03/10/2019 1354   CREATININE 0.75 03/10/2019 1354   CALCIUM 9.7  03/10/2019 1354   PROT 7.8 03/10/2019 1354   ALBUMIN 4.8 03/10/2019 1354   AST 12 03/10/2019 1354   ALT 18 03/10/2019 1354   ALKPHOS 31 (L) 03/10/2019 1354   BILITOT 1.1 03/10/2019 1354   GFRNONAA 97 03/10/2019 1354   GFRAA 111 03/10/2019 1354     Diabetic Labs (most recent): Lab Results  Component Value Date   HGBA1C 9.8 (H) 03/10/2019   HGBA1C 15.3 (H) 12/01/2018   HGBA1C 8.7 (H) 09/23/2017     Lipid Panel ( most recent) Lipid Panel     Component Value Date/Time   CHOL 135 05/25/2017   TRIG 106 05/25/2017   HDL 49 05/25/2017   LDLCALC 67 05/25/2017     Assessment & Plan:   1. Uncontrolled type 2 diabetes mellitus with hyperglycemia ([place])  - Patient has currently uncontrolled symptomatic type 2 DM since  48 years of age. -She returns with improved glycemic profile, still significantly above target.  Her previsit labs show A1c of 9.8% improving from 15.3%. - She does not report any gross competitions from her  diabetes, however,  Tzipporah Nagorski remains at a high risk for more acute and chronic complications which include CAD, CVA, CKD, retinopathy, and neuropathy. These are all discussed in detail with the patient.  - I have counseled her on diet management and weight loss, by adopting a carbohydrate restricted/protein rich diet.  - she  admits there is a room for improvement in her diet and drink choices. -  Suggestion is made for her to avoid simple carbohydrates  from her diet including Cakes, Sweet Desserts / Pastries, Ice Cream, Soda (diet and regular), Sweet Tea, Candies, Chips, Cookies, Sweet Pastries,  Store Bought Juices, Alcohol in Excess of  1-2 drinks a day, Artificial Sweeteners, Coffee Creamer, and "Sugar-free" Products. This will help patient to have stable blood glucose profile and potentially avoid unintended weight gain.   - I encouraged her to switch to  unprocessed or minimally processed complex starch and increased protein intake (animal or plant source), fruits, and vegetables.  - she is advised to stick to a routine mealtimes to eat 3 meals  a day and avoid unnecessary snacks ( to snack only to correct hypoglycemia).    - I have approached her with the following individualized plan to manage diabetes and patient agrees:   -Based on her presentation with significantly improved glycemic profile, she will not be given prandial insulin for now.   -She will tolerate a higher dose of basal insulin.  I discussed and increased her Levemir 70 units nightly associated with monitoring of blood glucose twice a day-before breakfast and bedtime.    - Patient is warned not to take insulin without proper monitoring per orders.  -She is advised to  call clinic for blood glucose levels less than 70 or above 300 mg /dl. -She will continue  Janumet 50/1000 mg by mouth twice a day, therapeutically suitable for patient .  - Patient specific target  A1c;  LDL, HDL, Triglycerides, and  Waist  Circumference were discussed in detail.  2) BP/HTN: Her blood pressure is controlled to target.  He is advised to continue current medications including lisinopril 10 mg p.o. daily.   3) Lipids/HPL: Her prior labs showed LDL of 67.  Patient is on atorvastatin 10 mg p.o. nightly.     4)  Weight/Diet: CDE Consult will be re-initiated , exercise, and detailed carbohydrates information provided.  5. Vitamin D deficiency- -He is status post treatment  with ergocalciferol 50,000 units weekly for 12 weeks.   6) Chronic Care/Health Maintenance:  -she  is on ACEI medications and  is encouraged to continue to follow up with Ophthalmology, Dentist,  Podiatrist at least yearly or according to recommendations, and advised to   stay away from smoking. I have recommended yearly flu vaccine and pneumonia vaccination at least every 5 years; moderate intensity exercise for up to 150 minutes weekly; and  sleep for at least 7 hours a day.   - I advised patient to maintain close follow up with Craig StaggersVasireddy, Sabitha, MD for primary care needs.  - Time spent with the patient: 25 min, of which >50% was spent in reviewing her blood glucose logs , discussing her hypoglycemia and hyperglycemia episodes, reviewing her current and  previous labs / studies and medications  doses and developing a plan to avoid hypoglycemia and hyperglycemia. Please refer to Patient Instructions for Blood Glucose Monitoring and Insulin/Medications Dosing Guide"  in media tab for additional information. Please  also refer to " Patient Self Inventory" in the Media  tab for reviewed elements of pertinent patient history.  [name] participated in the discussions, expressed understanding, and voiced agreement with the above plans.  All questions were answered to her satisfaction. she is encouraged to contact clinic should she have any questions or concerns prior to her return visit.   Follow up plan: - Return in about 3 months (around  06/14/2019) for Follow up with Pre-visit Labs, Meter, and Logs.  Marquis LunchGebre Marine Lezotte, MD Phone: [phone]  Fax: [phone]   03/14/2019, 5:27 PM This note was partially dictated with voice recognition software. Similar sounding words can be transcribed inadequately or may not  be corrected upon review.

## 2019-03-16 ENCOUNTER — Other Ambulatory Visit: Payer: Self-pay | Admitting: "Endocrinology

## 2019-04-01 ENCOUNTER — Other Ambulatory Visit: Payer: Self-pay | Admitting: "Endocrinology

## 2019-04-24 ENCOUNTER — Other Ambulatory Visit: Payer: Self-pay | Admitting: "Endocrinology

## 2019-06-09 ENCOUNTER — Other Ambulatory Visit: Payer: Self-pay | Admitting: "Endocrinology

## 2019-06-12 ENCOUNTER — Other Ambulatory Visit: Payer: Self-pay | Admitting: "Endocrinology

## 2019-06-13 LAB — COMPREHENSIVE METABOLIC PANEL
ALT: 14 IU/L (ref 0–32)
AST: 11 IU/L (ref 0–40)
Albumin/Globulin Ratio: 1.7 (ref 1.2–2.2)
Albumin: 4.5 g/dL (ref 3.8–4.8)
Alkaline Phosphatase: 37 IU/L — ABNORMAL LOW (ref 39–117)
BUN/Creatinine Ratio: 13 (ref 9–23)
BUN: 11 mg/dL (ref 6–24)
Bilirubin Total: 0.9 mg/dL (ref 0.0–1.2)
CO2: 23 mmol/L (ref 20–29)
Calcium: 9.4 mg/dL (ref 8.7–10.2)
Chloride: 99 mmol/L (ref 96–106)
Creatinine, Ser: 0.87 mg/dL (ref 0.57–1.00)
GFR calc Af Amer: 93 mL/min/{1.73_m2} (ref >59)
GFR calc non Af Amer: 81 mL/min/{1.73_m2} (ref >59)
Globulin, Total: 2.7 g/dL (ref 1.5–4.5)
Glucose: 373 mg/dL — ABNORMAL HIGH (ref 65–99)
Potassium: 5.1 mmol/L (ref 3.5–5.2)
Sodium: 137 mmol/L (ref 134–144)
Total Protein: 7.2 g/dL (ref 6.0–8.5)

## 2019-06-13 LAB — SPECIMEN STATUS REPORT

## 2019-06-13 LAB — HGB A1C W/O EAG: Hgb A1c MFr Bld: 10.2 % — ABNORMAL HIGH (ref 4.8–5.6)

## 2019-06-15 ENCOUNTER — Other Ambulatory Visit: Payer: Self-pay

## 2019-06-15 ENCOUNTER — Encounter: Payer: BC Managed Care – PPO | Attending: "Endocrinology | Admitting: Nutrition

## 2019-06-15 ENCOUNTER — Encounter: Payer: Self-pay | Admitting: Nutrition

## 2019-06-15 ENCOUNTER — Encounter: Payer: Self-pay | Admitting: "Endocrinology

## 2019-06-15 ENCOUNTER — Ambulatory Visit (INDEPENDENT_AMBULATORY_CARE_PROVIDER_SITE_OTHER): Payer: BC Managed Care – PPO | Admitting: "Endocrinology

## 2019-06-15 VITALS — Ht 65.5 in | Wt 170.0 lb

## 2019-06-15 DIAGNOSIS — E1165 Type 2 diabetes mellitus with hyperglycemia: Secondary | ICD-10-CM | POA: Diagnosis not present

## 2019-06-15 DIAGNOSIS — IMO0002 Reserved for concepts with insufficient information to code with codable children: Secondary | ICD-10-CM

## 2019-06-15 DIAGNOSIS — E782 Mixed hyperlipidemia: Secondary | ICD-10-CM

## 2019-06-15 DIAGNOSIS — I10 Essential (primary) hypertension: Secondary | ICD-10-CM

## 2019-06-15 DIAGNOSIS — E118 Type 2 diabetes mellitus with unspecified complications: Secondary | ICD-10-CM | POA: Insufficient documentation

## 2019-06-15 MED ORDER — VITAMIN D (ERGOCALCIFEROL) 1.25 MG (50000 UNIT) PO CAPS: 50000.0000 [IU] | ORAL_CAPSULE | ORAL | 0 refills | Status: DC

## 2019-06-15 MED ORDER — GLIPIZIDE ER 5 MG PO TB24: 5.0000 mg | ORAL_TABLET | Freq: Every day | ORAL | 3 refills | Status: DC

## 2019-06-15 NOTE — Progress Notes (Signed)
06/15/2019                                                    Endocrinology Telehealth Visit Follow up Note -During COVID -19 Pandemic  This visit type was conducted due to national recommendations for restrictions regarding the COVID-19 Pandemic  in an effort to limit this patient's exposure and mitigate transmission of the corona virus.  Due to her co-morbid illnesses, [name] is at  moderate to high risk for complications without adequate follow up.  This format is felt to be most appropriate for her at this time.  I connected with this patient on 06/15/2019   by telephone and verified that I am speaking with the correct person using two identifiers. [name], [birth year]. she has verbally consented to this visit. All issues noted in this document were discussed and addressed. The format was not optimal for physical exam.     Subjective:    Patient ID: [name], female    DOB: 12-Jun-1971.  she is being engaged in telehealth via telephone in follow-up for management of currently uncontrolled symptomatic diabetes requested by  Craig Staggers, MD.   Past Medical History:  Diagnosis Date  . Diabetes mellitus without complication (HCC)   . Infection    bladder infection  . Ovarian cyst   . Vaginal Pap smear, abnormal    laser removal of abn cells; normal since  . Varicose veins during pregnancy, antepartum    Past Surgical History:  Procedure Laterality Date  . CERVICAL CERCLAGE N/A 10/12/2014   Procedure: CERCLAGE CERVICAL;  Surgeon: Turner Daniels, MD;  Location: [facility];  Service: Gynecology;  Laterality: N/A;  . OOPHORECTOMY    . OVARIAN CYST REMOVAL     Social History   Socioeconomic History  . Marital status: Married    Spouse name: Not on file  . Number of children: Not on file  . Years of education: Not on file  . Highest education level: Not on file  Occupational History  . Not on file  Social Needs  . Financial resource strain: Not on file  . Food  insecurity    Worry: Not on file    Inability: Not on file  . Transportation needs    Medical: Not on file    Non-medical: Not on file  Tobacco Use  . Smoking status: Never Smoker  . Smokeless tobacco: Never Used  Substance and Sexual Activity  . Alcohol use: Yes    Comment: rare, none with pregnancy  . Drug use: No  . Sexual activity: Not Currently    Birth control/protection: Pill    Comment: on pill when found out pregnant  Lifestyle  . Physical activity    Days per week: Not on file    Minutes per session: Not on file  . Stress: Not on file  Relationships  . Social Musician on phone: Not on file    Gets together: Not on file    Attends religious service: Not on file    Active member of club or organization: Not on file    Attends meetings of clubs or organizations: Not on file    Relationship status: Not on file  Other Topics Concern  . Not on file  Social History Narrative  . Not on file  Outpatient Encounter Medications as of 06/15/2019  Medication Sig  . atorvastatin (LIPITOR) 10 MG tablet daily.  . B-D ULTRAFINE III SHORT PEN 31G X 8 MM MISC AS DIRECTED  . glipiZIDE (GLUCOTROL XL) 5 MG 24 hr tablet Take 1 tablet (5 mg total) by mouth daily with breakfast.  . glucose blood (ONE TOUCH ULTRA TEST) test strip Use as instructed  . JANUMET 50-1000 MG tablet TAKE 1 TABLET BY MOUTH TWICE A DAY  . LEVEMIR FLEXTOUCH 100 UNIT/ML Pen INJECT 70 UNITS INTO THE SKIN AT BEDTIME.  Marland Kitchen lisinopril (PRINIVIL,ZESTRIL) 10 MG tablet Take 10 mg by mouth daily.  . Vitamin D, Ergocalciferol, (DRISDOL) 50000 units CAPS capsule Take 1 capsule (50,000 Units total) by mouth every 7 (seven) days.   No facility-administered encounter medications on file as of 06/15/2019.     ALLERGIES: No Known Allergies  VACCINATION STATUS: Immunization History  Administered Date(s) Administered  . Tdap 01/22/2015    Diabetes She presents for her follow-up diabetic visit. She has type 2  diabetes mellitus. Onset time: She was diagnosed at approximate age of 48 years. Her disease course has been worsening. There are no hypoglycemic associated symptoms. Pertinent negatives for hypoglycemia include no confusion, headaches, pallor or seizures. Associated symptoms include polydipsia and polyuria. Pertinent negatives for diabetes include no chest pain, no fatigue and no polyphagia. There are no hypoglycemic complications. Symptoms are worsening. Risk factors for coronary artery disease include dyslipidemia, diabetes mellitus, family history and sedentary lifestyle. Current diabetic treatment includes insulin injections and oral agent (dual therapy). She is compliant with treatment none of the time. Her weight is fluctuating minimally. She is following a generally unhealthy diet. When asked about meal planning, she reported none. She has had a previous visit with a dietitian. She participates in exercise intermittently. Her home blood glucose trend is decreasing steadily. Her breakfast blood glucose range is generally >200 mg/dl. Her bedtime blood glucose range is generally >200 mg/dl. Her overall blood glucose range is >200 mg/dl. An ACE inhibitor/angiotensin II receptor blocker is being taken. Eye exam is not current.  Hypertension This is a chronic problem. The current episode started more than 1 year ago. Pertinent negatives include no chest pain, headaches, palpitations or shortness of breath. Risk factors for coronary artery disease include dyslipidemia, diabetes mellitus and sedentary lifestyle. Past treatments include ACE inhibitors.   Review of systems: Limited as above.  Objective:    There were no vitals taken for this visit.  Wt Readings from Last 3 Encounters:  06/15/19 170 lb (77.1 kg)  03/14/19 169 lb (76.7 kg)  12/05/18 161 lb (73 kg)     CMP ( most recent) CMP     Component Value Date/Time   NA 137 06/12/2019 0900   K 5.1 06/12/2019 0900   CL 99 06/12/2019 0900   CO2  23 06/12/2019 0900   GLUCOSE 373 (H) 06/12/2019 0900   GLUCOSE 79 01/14/2015 1720   BUN 11 06/12/2019 0900   CREATININE 0.87 06/12/2019 0900   CALCIUM 9.4 06/12/2019 0900   PROT 7.2 06/12/2019 0900   ALBUMIN 4.5 06/12/2019 0900   AST 11 06/12/2019 0900   ALT 14 06/12/2019 0900   ALKPHOS 37 (L) 06/12/2019 0900   BILITOT 0.9 06/12/2019 0900   GFRNONAA 81 06/12/2019 0900   GFRAA 93 06/12/2019 0900     Diabetic Labs (most recent): Lab Results  Component Value Date   HGBA1C 10.2 (H) 06/12/2019   HGBA1C 9.8 (H) 03/10/2019   HGBA1C 15.3 (  H) 12/01/2018     Lipid Panel ( most recent) Lipid Panel     Component Value Date/Time   CHOL 135 05/25/2017   TRIG 106 05/25/2017   HDL 49 05/25/2017   LDLCALC 67 05/25/2017     Assessment & Plan:   1. Uncontrolled type 2 diabetes mellitus with hyperglycemia ([place])  - Patient has currently uncontrolled symptomatic type 2 DM since  48 years of age. -She reports significantly above target glycemic profile ranging between 92-210 at fasting, between 100-261 at bedtime.  She reports some rare and random hypoglycemia overnight.    -Her previsit labs show A1c of 10.2% increasing from 9.8%.   -She did have A1c as high as 15.3% last year.  - She does not report any gross complications  from her diabetes, however,  [name] remains at a high risk for more acute and chronic complications which include CAD, CVA, CKD, retinopathy, and neuropathy. These are all discussed in detail with the patient.  - I have counseled her on diet management and weight loss, by adopting a carbohydrate restricted/protein rich diet.  - she  admits there is a room for improvement in her diet and drink choices. -  Suggestion is made for her to avoid simple carbohydrates  from her diet including Cakes, Sweet Desserts / Pastries, Ice Cream, Soda (diet and regular), Sweet Tea, Candies, Chips, Cookies, Sweet Pastries,  Store Bought Juices, Alcohol in Excess of  1-2 drinks a  day, Artificial Sweeteners, Coffee Creamer, and "Sugar-free" Products. This will help patient to have stable blood glucose profile and potentially avoid unintended weight gain.   - I encouraged her to switch to  unprocessed or minimally processed complex starch and increased protein intake (animal or plant source), fruits, and vegetables.  - she is advised to stick to a routine mealtimes to eat 3 meals  a day and avoid unnecessary snacks ( to snack only to correct hypoglycemia).    - I have approached her with the following individualized plan to manage diabetes and patient agrees:   -Based on her presentation with significantly above target glycemic profile, she will need more intensive treatment.  She would not tolerate any higher dose of Levemir.  She is advised to continue Levemir 70 units nightly associated with monitoring of blood glucose at least 2 times a day-daily before breakfast and at bedtime.    -Her options include adding low-dose glipizide which she prefers.  I discussed and added glipizide 5 mg p.o. daily at breakfast.    - Patient is warned not to take insulin without proper monitoring per orders.  -She is advised to  call clinic for blood glucose levels less than 70 or above 300 mg /dl. -She will continue  Janumet 50/1000 mg by mouth twice a day, therapeutically suitable for patient .  - Patient specific target  A1c;  LDL, HDL, Triglycerides, and  Waist Circumference were discussed in detail.  2) BP/HTN: she is advised to home monitor blood pressure and report if > 140/90 on 2 separate readings.  she is advised to continue current medications including lisinopril 10 mg p.o. daily.   3) Lipids/HPL: Her prior labs showed LDL of 67.  She is advised to continue atorvastatin 10 mg p.o. nightly.     4)  Weight/Diet: CDE Consult has been  re-initiated , exercise, and detailed carbohydrates information provided.  5. Vitamin D deficiency- -She will be reconsidered for treatment  with vitamin D.   6) Chronic Care/Health Maintenance:  -  she  is on ACEI medications and  is encouraged to continue to follow up with Ophthalmology, Dentist,  Podiatrist at least yearly or according to recommendations, and advised to   stay away from smoking. I have recommended yearly flu vaccine and pneumonia vaccination at least every 5 years; moderate intensity exercise for up to 150 minutes weekly; and  sleep for at least 7 hours a day.   - I advised patient to maintain close follow up with Craig StaggersVasireddy, Sabitha, MD for primary care needs.  - Patient Care Time Today:  25 min, of which >50% was spent in  counseling and the rest reviewing her  current and  previous labs/studies, previous treatments, her blood glucose readings, and medications' doses and developing a plan for long-term care based on the latest recommendations for standards of care.   Gina HandyWendy Bolander participated in the discussions, expressed understanding, and voiced agreement with the above plans.  All questions were answered to her satisfaction. she is encouraged to contact clinic should she have any questions or concerns prior to her return visit.   Follow up plan: - Return in about 3 months (around 09/14/2019) for Bring Meter and Logs- A1c in Office, Include 8 log sheets.  Marquis LunchGebre Kincade Granberg, MD Phone: [phone]  Fax: [phone]   06/15/2019, 5:47 PM This note was partially dictated with voice recognition software. Similar sounding words can be transcribed inadequately or may not  be corrected upon review.

## 2019-06-15 NOTE — Patient Instructions (Signed)
Goals  Eat 30-45 grams of carbs per meal  Increase low carb vegetables with lunch and dinner  Walk 60 minutes 4 days a week  Increase water intake to 84 oz per day.

## 2019-06-15 NOTE — Progress Notes (Signed)
Medical Nutrition Therapy:  Appt start time: 1191 end time:  1700 Telephone visit.  Assessment:  Primary concerns today: Diabetes Type 2.  Just talked to Dr.Nida today. Has t been working on eating more fresh fruits and vegetables. Had 2 low blood sugars at 60's at 4-430 am in early Sept.  70 units of Levemir, and Glipizide 5 mg once a day was added today per Dr. Dorris Fetch. Also on Janument. Drinking more water than she use.    A1c UP 10.2%. Was swimming but not anymore and willing to start walking. Lab Results  Component Value Date   HGBA1C 10.2 (H) 06/12/2019   CMP Latest Ref Rng & Units 06/12/2019 03/10/2019 12/01/2018  Glucose 65 - 99 mg/dL 373(H) 182(H) 626(HH)  BUN 6 - 24 mg/dL 11 8 11   Creatinine 0.57 - 1.00 mg/dL 0.87 0.75 0.89  Sodium 134 - 144 mmol/L 137 136 133(L)  Potassium 3.5 - 5.2 mmol/L 5.1 4.7 5.5(H)  Chloride 96 - 106 mmol/L 99 100 93(L)  CO2 20 - 29 mmol/L 23 24 22   Calcium 8.7 - 10.2 mg/dL 9.4 9.7 9.5  Total Protein 6.0 - 8.5 g/dL 7.2 7.8 6.9  Total Bilirubin 0.0 - 1.2 mg/dL 0.9 1.1 0.8  Alkaline Phos 39 - 117 IU/L 37(L) 31(L) 42  AST 0 - 40 IU/L 11 12 20   ALT 0 - 32 IU/L 14 18 21   Preferred Learning Style:  No preference indicated   Learning Readiness:   Ready  Change in progress   MEDICATIONS: See list   DIETARY INTAKE:  B) Oatmeal or eggs, bacon coffee, half and falf  L) hamburger and sweet potatoe, or grille chicken salad,  D) Bacon , lettuce and tomato sandwich, mixed vegetables.water Usual physical activity: Walking and swimming   Estimated energy needs: 1500  calories 170 g carbohydrates 112 g protein 42 g fat  Progress Towards Goal(s):  In progress.   Nutritional Diagnosis:  NB-1.1 Food and nutrition-related knowledge deficit As related to Diabetes Type 2.  As evidenced by A1C 11.9.    Intervention:  Nutrition and Diabetes education provided on My Plate, CHO counting, meal planning, portion sizes, timing of meals, avoiding snacks between  meals unless having a low blood sugar, target ranges for A1C and blood sugars, signs/symptoms and treatment of hyper/hypoglycemia, monitoring blood sugars, taking medications as prescribed, benefits of exercising 30 minutes per day and prevention of complications of DM. Goals  Eat 30-45 grams of carbs per meal  Increase low carb vegetables with lunch and dinner  Walk 60 minutes 4 days a week  Increase water intake to 84 oz per day.  Teaching Method Utilized:  Visual Auditory Hands on  Handouts given during visit include:  The Plate Method   Meal Plan Card  Diabetes LIving Well   Barriers to learning/adherence to lifestyle change: none  Demonstrated degree of understanding via:  Teach Back   Monitoring/Evaluation:  Dietary intake, exercise, meal planing, , and body weight in 3 month(s)  Follow up DM in 3 month.

## 2019-09-06 ENCOUNTER — Other Ambulatory Visit: Payer: Self-pay | Admitting: "Endocrinology

## 2019-09-08 ENCOUNTER — Other Ambulatory Visit: Payer: Self-pay | Admitting: "Endocrinology

## 2019-09-20 ENCOUNTER — Ambulatory Visit: Payer: BC Managed Care – PPO | Admitting: Nutrition

## 2019-09-20 ENCOUNTER — Other Ambulatory Visit: Payer: Self-pay | Admitting: "Endocrinology

## 2019-09-20 ENCOUNTER — Ambulatory Visit: Payer: BC Managed Care – PPO | Admitting: "Endocrinology

## 2019-09-20 DIAGNOSIS — E1165 Type 2 diabetes mellitus with hyperglycemia: Secondary | ICD-10-CM

## 2019-10-04 ENCOUNTER — Encounter: Payer: Self-pay | Admitting: "Endocrinology

## 2019-10-04 ENCOUNTER — Other Ambulatory Visit: Payer: Self-pay

## 2019-10-04 ENCOUNTER — Ambulatory Visit (INDEPENDENT_AMBULATORY_CARE_PROVIDER_SITE_OTHER): Payer: BC Managed Care – PPO | Admitting: "Endocrinology

## 2019-10-04 ENCOUNTER — Encounter: Payer: BC Managed Care – PPO | Attending: "Endocrinology | Admitting: Nutrition

## 2019-10-04 VITALS — BP 121/82 | HR 98 | Ht 65.0 in | Wt 173.0 lb

## 2019-10-04 DIAGNOSIS — I10 Essential (primary) hypertension: Secondary | ICD-10-CM

## 2019-10-04 DIAGNOSIS — E782 Mixed hyperlipidemia: Secondary | ICD-10-CM | POA: Diagnosis present

## 2019-10-04 DIAGNOSIS — E1165 Type 2 diabetes mellitus with hyperglycemia: Secondary | ICD-10-CM

## 2019-10-04 DIAGNOSIS — E559 Vitamin D deficiency, unspecified: Secondary | ICD-10-CM | POA: Diagnosis not present

## 2019-10-04 LAB — POCT GLYCOSYLATED HEMOGLOBIN (HGB A1C): Hemoglobin A1C: 10.3 % — AB (ref 4.0–5.6)

## 2019-10-04 MED ORDER — LEVEMIR FLEXTOUCH 100 UNIT/ML ~~LOC~~ SOPN: 80.0000 [IU] | PEN_INJECTOR | Freq: Every evening | SUBCUTANEOUS | 2 refills | Status: DC

## 2019-10-04 NOTE — Progress Notes (Signed)
Medical Nutrition Therapy:  Appt start time: 1530  end time:  1600 Assessment:  Primary concerns today: Diabetes Type 2.  Just talked to Dr.Nida today. A1C 10.3%. She notes she falls asleep at night and often forgets her insulin. Is going to start taking it close to dinner than bedtime. Will set alarm on phone. Change: working on roasting and eating more lower carb vegetables. 70 units of Levemir, and Glipizide 5 mg once a day per  Dr. Fransico Him. Also on Janument. Drinking more water than she use.   Will start walking. Wanted some breakfast ideas.   FBS 102-145 mg/dl when she doesn't forget her insulin. Forgets insulin at night often and feels that is the reason for the high A1C.     Lab Results  Component Value Date   HGBA1C 10.3 (A) 10/04/2019   CMP Latest Ref Rng & Units 06/12/2019 03/10/2019 12/01/2018  Glucose 65 - 99 mg/dL 119(E) 174(Y) 814(GY)  BUN 6 - 24 mg/dL 11 8 11   Creatinine 0.57 - 1.00 mg/dL 1.85 6.31  Sodium 134 - 144 mmol/L 137 136 133(L)  Potassium 3.5 - 5.2 mmol/L 5.1 4.7 5.5(H)  Chloride 96 - 106 mmol/L 99 100 93(L)  CO2 20 - 29 mmol/L 23 24 22   Calcium 8.7 - 10.2 mg/dL 9.4 9.7 9.5  Total Protein 6.0 - 8.5 g/dL 7.2 7.8 6.9  Total Bilirubin 0.0 - 1.2 mg/dL 0.9 1.1 0.8  Alkaline Phos 39 - 117 IU/L 37(L) 31(L) 42  AST 0 - 40 IU/L 11 12 20   ALT 0 - 32 IU/L 14 18 21   Preferred Learning Style:  No preference indicated   Learning Readiness:   Ready  Change in progress   MEDICATIONS: See list   DIETARY INTAKE:  B) 1/2bagel with cream cheese and coffee-black or oameal with pecan and apple  L)  Chicken  noodle soup and 1/2 sandwich, water D) Toss salad and 1 cup baked zit and unswee tea.Usual physical activity: Walking and swimming   Estimated energy needs: 1500  calories 170 g carbohydrates 112 g protein 42 g fat  Progress Towards Goal(s):  In progress.   Nutritional Diagnosis:  NB-1.1 Food and nutrition-related knowledge deficit As related to  Diabetes Type 2.  As evidenced by A1C 11.9.    Intervention:  Nutrition and Diabetes education provided on My Plate, CHO counting, meal planning, portion sizes, timing of meals, avoiding snacks between meals unless having a low blood sugar, target ranges for A1C and blood sugars, signs/symptoms and treatment of hyper/hypoglycemia, monitoring blood sugars, taking medications as prescribed, benefits of exercising 30 minutes per day and prevention of complications of DM.  Goals Take insulin at night at 730-8 pm.  Eat 30-45 grams of carbs per meal  Increase low carb vegetables with lunch and dinner  Walk 60 minutes 4 days a week  Increase water intake to 84 oz per day.  Teaching Method Utilized:  Visual Auditory Hands on  Handouts given during visit include:  The Plate Method   Meal Plan Card  Diabetes LIving Well   Barriers to learning/adherence to lifestyle change: none  Demonstrated degree of understanding via:  Teach Back   Monitoring/Evaluation:  Dietary intake, exercise, meal planing, , and body weight in 3 month(s)

## 2019-10-04 NOTE — Progress Notes (Signed)
10/04/2019           Endocrinology follow-up note  Subjective:    Patient ID: [name], female    DOB: 1971-07-21.  she is being seen in follow-up for management of currently uncontrolled symptomatic diabetes requested by  Craig Staggers, MD.   Past Medical History:  Diagnosis Date  . Diabetes mellitus without complication (HCC)   . Infection    bladder infection  . Ovarian cyst   . Vaginal Pap smear, abnormal    laser removal of abn cells; normal since  . Varicose veins during pregnancy, antepartum    Past Surgical History:  Procedure Laterality Date  . CERVICAL CERCLAGE N/A 10/12/2014   Procedure: CERCLAGE CERVICAL;  Surgeon: Turner Daniels, MD;  Location: [facility];  Service: Gynecology;  Laterality: N/A;  . OOPHORECTOMY    . OVARIAN CYST REMOVAL     Social History   Socioeconomic History  . Marital status: Married    Spouse name: Not on file  . Number of children: Not on file  . Years of education: Not on file  . Highest education level: Not on file  Occupational History  . Not on file  Tobacco Use  . Smoking status: Never Smoker  . Smokeless tobacco: Never Used  Substance and Sexual Activity  . Alcohol use: Yes    Comment: rare, none with pregnancy  . Drug use: No  . Sexual activity: Not Currently    Birth control/protection: Pill    Comment: on pill when found out pregnant  Other Topics Concern  . Not on file  Social History Narrative  . Not on file   Social Determinants of Health   Financial Resource Strain:   . Difficulty of Paying Living Expenses: Not on file  Food Insecurity:   . Worried About Programme researcher, broadcasting/film/video in the Last Year: Not on file  . Ran Out of Food in the Last Year: Not on file  Transportation Needs:   . Lack of Transportation (Medical): Not on file  . Lack of Transportation (Non-Medical): Not on file  Physical Activity:   . Days of Exercise per Week: Not on file  . Minutes of Exercise per Session: Not on file  Stress:    . Feeling of Stress : Not on file  Social Connections:   . Frequency of Communication with Friends and Family: Not on file  . Frequency of Social Gatherings with Friends and Family: Not on file  . Attends Religious Services: Not on file  . Active Member of Clubs or Organizations: Not on file  . Attends Banker Meetings: Not on file  . Marital Status: Not on file   Outpatient Encounter Medications as of 10/04/2019  Medication Sig  . atorvastatin (LIPITOR) 10 MG tablet daily.  . B-D ULTRAFINE III SHORT PEN 31G X 8 MM MISC AS DIRECTED  . glipiZIDE (GLUCOTROL XL) 5 MG 24 hr tablet TAKE 1 TABLET BY MOUTH EVERY DAY WITH BREAKFAST  . glucose blood (ONE TOUCH ULTRA TEST) test strip Use as instructed  . Insulin Detemir (LEVEMIR FLEXTOUCH) 100 UNIT/ML Pen Inject 80 Units into the skin every evening.  Marland Kitchen JANUMET 50-1000 MG tablet TAKE 1 TABLET BY MOUTH TWICE A DAY  . lisinopril (PRINIVIL,ZESTRIL) 10 MG tablet Take 10 mg by mouth daily.  . [DISCONTINUED] LEVEMIR FLEXTOUCH 100 UNIT/ML Pen INJECT 70 UNITS INTO THE SKIN AT BEDTIME.  . [DISCONTINUED] Vitamin D, Ergocalciferol, (DRISDOL) 1.25 MG (50000 UT) CAPS capsule Take 1  capsule (50,000 Units total) by mouth every 7 (seven) days.   No facility-administered encounter medications on file as of 10/04/2019.    ALLERGIES: No Known Allergies  VACCINATION STATUS: Immunization History  Administered Date(s) Administered  . Tdap 01/22/2015    Diabetes She presents for her follow-up diabetic visit. She has type 2 diabetes mellitus. Onset time: She was diagnosed at approximate age of 62 years. Her disease course has been worsening. There are no hypoglycemic associated symptoms. Pertinent negatives for hypoglycemia include no confusion, headaches, pallor or seizures. Associated symptoms include polydipsia and polyuria. Pertinent negatives for diabetes include no chest pain, no fatigue and no polyphagia. There are no hypoglycemic complications.  Symptoms are worsening. Risk factors for coronary artery disease include dyslipidemia, diabetes mellitus, family history and sedentary lifestyle. Current diabetic treatment includes insulin injections and oral agent (dual therapy). She is compliant with treatment none of the time. Her weight is stable. She is following a generally unhealthy diet. When asked about meal planning, she reported none. She has had a previous visit with a dietitian. She participates in exercise intermittently. There is no change in her home blood glucose trend. Her breakfast blood glucose range is generally >200 mg/dl. Her bedtime blood glucose range is generally >200 mg/dl. Her overall blood glucose range is >200 mg/dl. (Presents with significantly above target glycemic profile both fasting and postprandial.  This is mainly due to the fact that she missed more than 50% of her Levemir injection opportunities.  This is mainly because she falls asleep in the early hours of the evening. ) An ACE inhibitor/angiotensin II receptor blocker is being taken. Eye exam is not current.  Hypertension This is a chronic problem. The current episode started more than 1 year ago. Pertinent negatives include no chest pain, headaches, palpitations or shortness of breath. Risk factors for coronary artery disease include dyslipidemia, diabetes mellitus and sedentary lifestyle. Past treatments include ACE inhibitors.   Review of systems:  Constitutional: no weight gain/loss, no fatigue, no subjective hyperthermia, no subjective hypothermia Eyes: no blurry vision, no xerophthalmia ENT: no sore throat, no nodules palpated in throat, no dysphagia/odynophagia, no hoarseness Cardiovascular: no Chest Pain, no Shortness of Breath, no palpitations, no leg swelling Respiratory: no cough, no SOB Gastrointestinal: no Nausea/Vomiting/Diarhhea Musculoskeletal: no muscle/joint aches Skin: no rashes Neurological: no tremors, no numbness, no tingling, no  dizziness Psychiatric: no depression, no anxiety   Objective:    BP 121/82   Pulse 98   Ht 5\' 5"  (1.651 m)   Wt 173 lb (78.5 kg)   BMI 28.79 kg/m   Wt Readings from Last 3 Encounters:  10/04/19 173 lb (78.5 kg)  06/15/19 170 lb (77.1 kg)  03/14/19 169 lb (76.7 kg)     Physical Exam- Limited  Constitutional:  Body mass index is 28.79 kg/m. , not in acute distress, normal state of mind Eyes:  EOMI, no exophthalmos Neck: Supple Respiratory: Adequate breathing efforts Musculoskeletal: no gross deformities, strength intact in all four extremities, no gross restriction of joint movements Skin:  no rashes, no hyperemia Neurological: no tremor with outstretched hands.  CMP ( most recent) CMP     Component Value Date/Time   NA 137 06/12/2019 0900   K 5.1 06/12/2019 0900   CL 99 06/12/2019 0900   CO2 23 06/12/2019 0900   GLUCOSE 373 (H) 06/12/2019 0900   GLUCOSE 79 01/14/2015 1720   BUN 11 06/12/2019 0900   CREATININE 0.87 06/12/2019 0900   CALCIUM 9.4 06/12/2019 0900  PROT 7.2 06/12/2019 0900   ALBUMIN 4.5 06/12/2019 0900   AST 11 06/12/2019 0900   ALT 14 06/12/2019 0900   ALKPHOS 37 (L) 06/12/2019 0900   BILITOT 0.9 06/12/2019 0900   GFRNONAA 81 06/12/2019 0900   GFRAA 93 06/12/2019 0900    Diabetic Labs (most recent): Lab Results  Component Value Date   HGBA1C 10.3 (A) 10/04/2019   HGBA1C 10.2 (H) 06/12/2019   HGBA1C 9.8 (H) 03/10/2019     Lipid Panel ( most recent) Lipid Panel     Component Value Date/Time   CHOL 135 05/25/2017 0000   TRIG 106 05/25/2017 0000   HDL 49 05/25/2017 0000   LDLCALC 67 05/25/2017 0000     Assessment & Plan:   1. Uncontrolled type 2 diabetes mellitus with hyperglycemia (HCC)  - Patient has currently uncontrolled symptomatic type 2 DM since  49 years of age. -She returns with persistently above target glycemic profile on subsequent days of her missed Levemir opportunities.   She does not have any documented or reported  hypoglycemia.  -Her point-of-care A1c is 10.3%, unchanged from her last visit A1c of 10.2% although it has improved from 15.3% A1c from last year.     - She does not report any gross complications  from her diabetes, however,  Natasa Stigall remains at a high risk for more acute and chronic complications which include CAD, CVA, CKD, retinopathy, and neuropathy. These are all discussed in detail with the patient.  - I have counseled her on diet management and weight loss, by adopting a carbohydrate restricted/protein rich diet.  - she  admits there is a room for improvement in her diet and drink choices. -  Suggestion is made for her to avoid simple carbohydrates  from her diet including Cakes, Sweet Desserts / Pastries, Ice Cream, Soda (diet and regular), Sweet Tea, Candies, Chips, Cookies, Sweet Pastries,  Store Bought Juices, Alcohol in Excess of  1-2 drinks a day, Artificial Sweeteners, Coffee Creamer, and "Sugar-free" Products. This will help patient to have stable blood glucose profile and potentially avoid unintended weight gain.  - I encouraged her to switch to  unprocessed or minimally processed complex starch and increased protein intake (animal or plant source), fruits, and vegetables.  - she is advised to stick to a routine mealtimes to eat 3 meals  a day and avoid unnecessary snacks ( to snack only to correct hypoglycemia).    - I have approached her with the following individualized plan to manage diabetes and patient agrees:   -Based on her presentation with significantly above target glycemic profile, she will need more intensive treatment.  Her failure to control is mainly due to the fact that she missed more than 50% of insulin injection opportunities.  To avoid that, she is allowed to inject Levemir in the earlier evening hours between 8 and 9 PM associated with monitoring of blood glucose at least 2 times a day-daily before breakfast and at bedtime.    -She has tolerated  low-dose glipizide, advised to continue glipizide 5 mg p.o. daily at breakfast.  She is also on Janumet 50/1000 mg p.o. twice daily after breakfast and after supper.   -She is advised to  call clinic for blood glucose levels less than 70 or above 200 mg /dl.   - Patient specific target  A1c;  LDL, HDL, Triglycerides, and  Waist Circumference were discussed in detail.  2) BP/HTN: Her blood pressure is controlled to target.  she is advised  to continue current medications including lisinopril 10 mg p.o. daily.   3) Lipids/HPL: Her prior labs showed LDL of 67.  He is currently on atorvastatin 10 mg p.o. nightly.   4)  Weight/Diet: CDE Consult has been  re-initiated , exercise, and detailed carbohydrates information provided. 5) vitamin D deficiency: She is status post vitamin D 2 50,000 units weekly.  6) Chronic Care/Health Maintenance:  -she  is on ACEI medications and  is encouraged to continue to follow up with Ophthalmology, Dentist,  Podiatrist at least yearly or according to recommendations, and advised to   stay away from smoking. I have recommended yearly flu vaccine and pneumonia vaccination at least every 5 years; moderate intensity exercise for up to 150 minutes weekly; and  sleep for at least 7 hours a day.   - I advised patient to maintain close follow up with Dairl Ponder, MD for primary care needs.  - Time spent on this patient care encounter:  35 min, of which > 50% was spent in  counseling and the rest reviewing her blood glucose logs , discussing her hypoglycemia and hyperglycemia episodes, reviewing her current and  previous labs / studies  ( including abstraction from other facilities) and medications  doses and developing a  long term treatment plan and documenting her care.   Please refer to Patient Instructions for Blood Glucose Monitoring and Insulin/Medications Dosing Guide"  in media tab for additional information. Please  also refer to " Patient Self Inventory" in  the Media  tab for reviewed elements of pertinent patient history.  Hendricks Limes participated in the discussions, expressed understanding, and voiced agreement with the above plans.  All questions were answered to her satisfaction. she is encouraged to contact clinic should she have any questions or concerns prior to her return visit.    Follow up plan: - Return in about 4 months (around 02/01/2020) for Bring Meter and Logs- A1c in Office, Follow up with Pre-visit Labs.  Glade Lloyd, MD Phone: [phone]  Fax: [phone]   10/04/2019, 4:02 PM This note was partially dictated with voice recognition software. Similar sounding words can be transcribed inadequately or may not  be corrected upon review.

## 2019-10-04 NOTE — Patient Instructions (Signed)
                                     Advice for Weight Management  -For most of us the best way to lose weight is by diet management. Generally speaking, diet management means consuming less calories intentionally which over time brings about progressive weight loss.  This can be achieved more effectively by restricting carbohydrate consumption to the minimum possible.  So, it is critically important to know your numbers: how much calorie you are consuming and how much calorie you need. More importantly, our carbohydrates sources should be unprocessed or minimally processed complex starch food items.   Sometimes, it is important to balance nutrition by increasing protein intake (animal or plant source), fruits, and vegetables.  -Sticking to a routine mealtime to eat 3 meals a day and avoiding unnecessary snacks is shown to have a big role in weight control. Under normal circumstances, the only time we lose real weight is when we are hungry, so allow hunger to take place- hunger means no food between meal times, only water.  It is not advisable to starve.   -It is better to avoid simple carbohydrates including: Cakes, Sweet Desserts, Ice Cream, Soda (diet and regular), Sweet Tea, Candies, Chips, Cookies, Store Bought Juices, Alcohol in Excess of  1-2 drinks a day, Artificial Sweeteners, Doughnuts, Coffee Creamers, "Sugar-free" Products, etc, etc.  This is not a complete list.....    -Consulting with certified diabetes educators is proven to provide you with the most accurate and current information on diet.  Also, you may be  interested in discussing diet options/exchanges , we can schedule a visit with Penny Crumpton, RDN, CDE for individualized nutrition education.  -Exercise: If you are able: 30 -60 minutes a day ,4 days a week, or 150 minutes a week.  The longer the better.  Combine stretch, strength, and aerobic activities.  If you were told in the past that you  have high risk for cardiovascular diseases, you may seek evaluation by your heart doctor prior to initiating moderate to intense exercise programs.                                  Additional Care Considerations for Diabetes   -Diabetes  is a chronic disease.  The most important care consideration is regular follow-up with your diabetes care provider with the goal being avoiding or delaying its complications and to take advantage of advances in medications and technology.    -Type 2 diabetes is known to coexist with other important comorbidities such as high blood pressure and high cholesterol.  It is critical to control not only the diabetes but also the high blood pressure and high cholesterol to minimize and delay the risk of complications including coronary artery disease, stroke, amputations, blindness, etc.    - Studies showed that people with diabetes will benefit from a class of medications known as ACE inhibitors and statins.  Unless there are specific reasons not to be on these medications, the standard of care is to consider getting one from these groups of medications at an optimal doses.  These medications are generally considered safe and proven to help protect the heart and the kidneys.    - People with diabetes are encouraged to initiate and maintain regular follow-up with eye doctors, foot doctors, dentists ,   and if necessary heart and kidney doctors.     - It is highly recommended that people with diabetes quit smoking or stay away from smoking, and get yearly  flu vaccine and pneumonia vaccine at least every 5 years.  One other important lifestyle recommendation is to ensure adequate sleep - at least 6-7 hours of uninterrupted sleep at night.  -Exercise: If you are able: 30 -60 minutes a day, 4 days a week, or 150 minutes a week.  The longer the better.  Combine stretch, strength, and aerobic activities.  If you were told in the past that you have high risk for cardiovascular  diseases, you may seek evaluation by your heart doctor prior to initiating moderate to intense exercise programs.     COVID-19 Vaccine Information can be found at: [email or web address] For questions related to vaccine distribution or appointments, please email [email or web address] or call [phone].        

## 2019-10-04 NOTE — Patient Instructions (Signed)
Goals Take insulin at night at 730-8 pm.  Eat 30-45 grams of carbs per meal  Increase low carb vegetables with lunch and dinner  Walk 60 minutes 4 days a week  Increase water intake to 84 oz per day

## 2019-10-16 ENCOUNTER — Encounter: Payer: Self-pay | Admitting: Nutrition

## 2019-12-13 ENCOUNTER — Other Ambulatory Visit: Payer: Self-pay | Admitting: "Endocrinology

## 2019-12-13 LAB — LIPID PANEL
Cholesterol: 113 (ref 0–200)
HDL: 50 (ref 35–70)
LDL Cholesterol: 47
Triglycerides: 81 (ref 40–160)

## 2019-12-13 LAB — BASIC METABOLIC PANEL
BUN: 11 (ref 4–21)
Creatinine: 0.8 (ref 0.5–1.1)

## 2019-12-13 LAB — TSH: TSH: 1.3 (ref 0.41–5.90)

## 2019-12-13 LAB — VITAMIN D 25 HYDROXY (VIT D DEFICIENCY, FRACTURES): Vit D, 25-Hydroxy: 15

## 2020-02-06 ENCOUNTER — Encounter: Payer: Self-pay | Admitting: Nutrition

## 2020-02-06 ENCOUNTER — Other Ambulatory Visit: Payer: Self-pay

## 2020-02-06 ENCOUNTER — Ambulatory Visit (INDEPENDENT_AMBULATORY_CARE_PROVIDER_SITE_OTHER): Payer: BC Managed Care – PPO | Admitting: "Endocrinology

## 2020-02-06 ENCOUNTER — Encounter: Payer: Self-pay | Admitting: "Endocrinology

## 2020-02-06 ENCOUNTER — Encounter: Payer: BC Managed Care – PPO | Attending: "Endocrinology | Admitting: Nutrition

## 2020-02-06 VITALS — BP 110/78 | HR 96 | Ht 65.0 in | Wt 176.0 lb

## 2020-02-06 VITALS — Ht 65.5 in | Wt 176.0 lb

## 2020-02-06 DIAGNOSIS — E1165 Type 2 diabetes mellitus with hyperglycemia: Secondary | ICD-10-CM | POA: Diagnosis not present

## 2020-02-06 DIAGNOSIS — E559 Vitamin D deficiency, unspecified: Secondary | ICD-10-CM | POA: Diagnosis not present

## 2020-02-06 DIAGNOSIS — I10 Essential (primary) hypertension: Secondary | ICD-10-CM | POA: Diagnosis present

## 2020-02-06 DIAGNOSIS — E782 Mixed hyperlipidemia: Secondary | ICD-10-CM | POA: Diagnosis present

## 2020-02-06 LAB — POCT GLYCOSYLATED HEMOGLOBIN (HGB A1C): Hemoglobin A1C: 10.1 % — AB (ref 4.0–5.6)

## 2020-02-06 MED ORDER — FREESTYLE LIBRE 14 DAY READER DEVI: 1.0000 | Freq: Once | 0 refills | Status: AC

## 2020-02-06 MED ORDER — FREESTYLE LIBRE 14 DAY SENSOR MISC: 1.0000 | 2 refills | Status: DC

## 2020-02-06 MED ORDER — ONETOUCH VERIO VI STRP: ORAL_STRIP | 2 refills | Status: AC

## 2020-02-06 MED ORDER — INSULIN ASPART 100 UNIT/ML FLEXPEN: 8.0000 [IU] | PEN_INJECTOR | Freq: Three times a day (TID) | SUBCUTANEOUS | 2 refills | Status: DC

## 2020-02-06 MED ORDER — LEVEMIR FLEXTOUCH 100 UNIT/ML ~~LOC~~ SOPN: 80.0000 [IU] | PEN_INJECTOR | Freq: Every evening | SUBCUTANEOUS | 2 refills | Status: DC

## 2020-02-06 MED ORDER — BD PEN NEEDLE SHORT U/F 31G X 8 MM MISC: 2 refills | Status: DC

## 2020-02-06 NOTE — Patient Instructions (Signed)

## 2020-02-06 NOTE — Progress Notes (Signed)
Medical Nutrition Therapy:  Appt start time: 1600   end time:  1630 Assessment:  Primary concerns today: Diabetes Type 2.   To see Dr. Fransico Him today. Taking her insulin at night 99% of the time. Set an alarm on her phone and it has helped. Hasn't been testing in am because she forgets. Will plan on setting an alarm for that also.    Has been eating the overnight oats and she loves eating that for breakfast. Holds her appetite til lunchtime. Drinking more water now. Drinks one coke zero at lunch.    Will get A1C today and results from lab work in April from PCP. Willing to start walking for improved blood sugars and weight loss. FBS 100-200's depending on what she eats the night before. Will get A1C today at Dr. Isidoro Donning office.   Lab Results  Component Value Date   HGBA1C 10.3 (A) 10/04/2019   CMP Latest Ref Rng & Units 06/12/2019 03/10/2019 12/01/2018  Glucose 65 - 99 mg/dL 546(F) 681(E) 751(ZG)  BUN 6 - 24 mg/dL 11 8 11   Creatinine 0.57 - 1.00 mg/dL 0.17 4.94  Sodium 134 - 144 mmol/L 137 136 133(L)  Potassium 3.5 - 5.2 mmol/L 5.1 4.7 5.5(H)  Chloride 96 - 106 mmol/L 99 100 93(L)  CO2 20 - 29 mmol/L 23 24 22   Calcium 8.7 - 10.2 mg/dL 9.4 9.7 9.5  Total Protein 6.0 - 8.5 g/dL 7.2 7.8 6.9  Total Bilirubin 0.0 - 1.2 mg/dL 0.9 1.1 0.8  Alkaline Phos 39 - 117 IU/L 37(L) 31(L) 42  AST 0 - 40 IU/L 11 12 20   ALT 0 - 32 IU/L 14 18 21   Preferred Learning Style:  No preference indicated   Learning Readiness:   Ready  Change in progress   MEDICATIONS: See list   DIETARY INTAKE:  B) 1/2bagel with cream cheese and coffee-black or oameal with pecan and apple  L)  Chicken  noodle soup and 1/2 sandwich, water D) Toss salad and 1 cup baked zit and unswee tea.Usual physical activity: Walking a little.  Estimated energy needs: 1500  calories 170 g carbohydrates 112 g protein 42 g fat  Progress Towards Goal(s):  In progress.   Nutritional Diagnosis:  NB-1.1 Food and  nutrition-related knowledge deficit As related to Diabetes Type 2.  As evidenced by A1C 11.9.    Intervention:  Nutrition and Diabetes education provided on My Plate, CHO counting, meal planning, portion sizes, timing of meals, avoiding snacks between meals unless having a low blood sugar, target ranges for A1C and blood sugars, signs/symptoms and treatment of hyper/hypoglycemia, monitoring blood sugars, taking medications as prescribed, benefits of exercising 30 minutes per day and prevention of complications of DM. Goals  Set alarm to test blood sugars before breakfast daily. Exercise 15 minutes a day. Get A1C down to 8% or lower. Drink 4-5 bottles of water per day.   Teaching Method Utilized:  Visual Auditory Hands on  Handouts given during visit include:  The Plate Method   Meal Plan Card  Diabetes LIving Well   Barriers to learning/adherence to lifestyle change: none  Demonstrated degree of understanding via:  Teach Back   Monitoring/Evaluation:  Dietary intake, exercise, meal planing, , and body weight in 3 month(s)

## 2020-02-06 NOTE — Progress Notes (Signed)
02/06/2020           Endocrinology follow-up note  Subjective:    Patient ID: [name], female    DOB: [birth year].  she is being seen in follow-up for management of currently uncontrolled symptomatic diabetes requested by  Craig Staggers, MD.   Past Medical History:  Diagnosis Date  . Diabetes mellitus without complication (HCC)   . Infection    bladder infection  . Ovarian cyst   . Vaginal Pap smear, abnormal    laser removal of abn cells; normal since  . Varicose veins during pregnancy, antepartum    Past Surgical History:  Procedure Laterality Date  . CERVICAL CERCLAGE N/A 10/12/2014   Procedure: CERCLAGE CERVICAL;  Surgeon: Turner Daniels, MD;  Location: [facility];  Service: Gynecology;  Laterality: N/A;  . OOPHORECTOMY    . OVARIAN CYST REMOVAL     Social History   Socioeconomic History  . Marital status: Married    Spouse name: Not on file  . Number of children: Not on file  . Years of education: Not on file  . Highest education level: Not on file  Occupational History  . Not on file  Tobacco Use  . Smoking status: Never Smoker  . Smokeless tobacco: Never Used  Substance and Sexual Activity  . Alcohol use: Yes    Comment: rare, none with pregnancy  . Drug use: No  . Sexual activity: Not Currently    Birth control/protection: Pill    Comment: on pill when found out pregnant  Other Topics Concern  . Not on file  Social History Narrative  . Not on file   Social Determinants of Health   Financial Resource Strain:   . Difficulty of Paying Living Expenses:   Food Insecurity:   . Worried About Programme researcher, broadcasting/film/video in the Last Year:   . Barista in the Last Year:   Transportation Needs:   . Freight forwarder (Medical):   Marland Kitchen Lack of Transportation (Non-Medical):   Physical Activity:   . Days of Exercise per Week:   . Minutes of Exercise per Session:   Stress:   . Feeling of Stress :   Social Connections:   . Frequency of Communication  with Friends and Family:   . Frequency of Social Gatherings with Friends and Family:   . Attends Religious Services:   . Active Member of Clubs or Organizations:   . Attends Banker Meetings:   Marland Kitchen Marital Status:    Outpatient Encounter Medications as of 02/06/2020  Medication Sig  . atorvastatin (LIPITOR) 10 MG tablet daily.  . Continuous Blood Gluc Receiver (FREESTYLE LIBRE 14 DAY READER) DEVI 1 each by Does not apply route once for 1 dose.  . Continuous Blood Gluc Sensor (FREESTYLE LIBRE 14 DAY SENSOR) MISC Inject 1 each into the skin every 14 (fourteen) days. Use as directed.  Marland Kitchen glipiZIDE (GLUCOTROL XL) 5 MG 24 hr tablet TAKE 1 TABLET BY MOUTH EVERY DAY WITH BREAKFAST  . glucose blood (ONETOUCH VERIO) test strip Use as instructed  . insulin aspart (NOVOLOG) 100 UNIT/ML FlexPen Inject 8-14 Units into the skin 3 (three) times daily with meals.  . insulin detemir (LEVEMIR FLEXTOUCH) 100 UNIT/ML FlexPen Inject 80 Units into the skin every evening.  . Insulin Pen Needle (B-D ULTRAFINE III SHORT PEN) 31G X 8 MM MISC AS DIRECTED  . JANUMET 50-1000 MG tablet TAKE 1 TABLET BY MOUTH TWICE A DAY  . lisinopril (  PRINIVIL,ZESTRIL) 10 MG tablet Take 10 mg by mouth daily.  . Vitamin D, Ergocalciferol, (DRISDOL) 1.25 MG (50000 UNIT) CAPS capsule Take 50,000 Units by mouth once a week.  . [DISCONTINUED] B-D ULTRAFINE III SHORT PEN 31G X 8 MM MISC AS DIRECTED  . [DISCONTINUED] glucose blood (ONE TOUCH ULTRA TEST) test strip Use as instructed  . [DISCONTINUED] Insulin Detemir (LEVEMIR FLEXTOUCH) 100 UNIT/ML Pen Inject 80 Units into the skin every evening.   No facility-administered encounter medications on file as of 02/06/2020.    ALLERGIES: No Known Allergies  VACCINATION STATUS: Immunization History  Administered Date(s) Administered  . Tdap 01/22/2015    Diabetes She presents for her follow-up diabetic visit. She has type 2 diabetes mellitus. Onset time: She was diagnosed at  approximate age of 49 years. Her disease course has been worsening. There are no hypoglycemic associated symptoms. Pertinent negatives for hypoglycemia include no confusion, headaches, pallor or seizures. Associated symptoms include polydipsia and polyuria. Pertinent negatives for diabetes include no chest pain, no fatigue and no polyphagia. There are no hypoglycemic complications. Symptoms are worsening. Risk factors for coronary artery disease include dyslipidemia, diabetes mellitus, family history and sedentary lifestyle. Current diabetic treatment includes insulin injections and oral agent (dual therapy). She is compliant with treatment none of the time. Her weight is stable. She is following a generally unhealthy diet. When asked about meal planning, she reported none. She has had a previous visit with a dietitian. She participates in exercise intermittently. Her home blood glucose trend is increasing steadily. Her breakfast blood glucose range is generally 130-140 mg/dl. Her bedtime blood glucose range is generally >200 mg/dl. Her overall blood glucose range is >200 mg/dl. (She presents with controlled fasting glycemic profile, however significantly above target postprandial glycemic profile.  Her point-of-care A1c is 10.1%.  She denies any hypoglycemic episodes.) An ACE inhibitor/angiotensin II receptor blocker is being taken. Eye exam is not current.  Hypertension This is a chronic problem. The current episode started more than 1 year ago. Pertinent negatives include no chest pain, headaches, palpitations or shortness of breath. Risk factors for coronary artery disease include dyslipidemia, diabetes mellitus and sedentary lifestyle. Past treatments include ACE inhibitors.     Review of systems  Constitutional: + Minimally fluctuating body weight,  current  Body mass index is 29.29 kg/m. , no fatigue, no subjective hyperthermia, no subjective hypothermia Eyes: no blurry vision, no  xerophthalmia ENT: no sore throat, no nodules palpated in throat, no dysphagia/odynophagia, no hoarseness Cardiovascular: no Chest Pain, no Shortness of Breath, no palpitations, no leg swelling Respiratory: no cough, no shortness of breath Gastrointestinal: no Nausea/Vomiting/Diarhhea Musculoskeletal: no muscle/joint aches Skin: no rashes, no hyperemia Neurological: no tremors, no numbness, no tingling, no dizziness Psychiatric: no depression, no anxiety   Objective:    BP 110/78   Pulse 96   Ht 5\' 5"  (1.651 m)   Wt 176 lb (79.8 kg)   BMI 29.29 kg/m   Wt Readings from Last 3 Encounters:  02/06/20 176 lb (79.8 kg)  02/06/20 176 lb (79.8 kg)  10/04/19 173 lb (78.5 kg)     Physical Exam- Limited  Constitutional:  Body mass index is 29.29 kg/m. , not in acute distress, normal state of mind Eyes:  EOMI, no exophthalmos Neck: Supple Thyroid: No gross goiter Respiratory: Adequate breathing efforts Musculoskeletal: no gross deformities, strength intact in all four extremities, no gross restriction of joint movements Skin:  no rashes, no hyperemia Neurological: no tremor with outstretched hands,  CMP ( most recent) CMP     Component Value Date/Time   NA 137 06/12/2019 0900   K 5.1 06/12/2019 0900   CL 99 06/12/2019 0900   CO2 23 06/12/2019 0900   GLUCOSE 373 (H) 06/12/2019 0900   GLUCOSE 79 01/14/2015 1720   BUN 11 12/13/2019 0000   CREATININE 0.8 12/13/2019 0000   CREATININE 0.87 06/12/2019 0900   CALCIUM 9.4 06/12/2019 0900   PROT 7.2 06/12/2019 0900   ALBUMIN 4.5 06/12/2019 0900   AST 11 06/12/2019 0900   ALT 14 06/12/2019 0900   ALKPHOS 37 (L) 06/12/2019 0900   BILITOT 0.9 06/12/2019 0900   GFRNONAA 81 06/12/2019 0900   GFRAA 93 06/12/2019 0900    Diabetic Labs (most recent): Lab Results  Component Value Date   HGBA1C 10.1 (A) 02/06/2020   HGBA1C 10.3 (A) 10/04/2019   HGBA1C 10.2 (H) 06/12/2019     Lipid Panel ( most recent) Lipid Panel      Component Value Date/Time   CHOL 113 12/13/2019 0000   TRIG 81 12/13/2019 0000   HDL 50 12/13/2019 0000   LDLCALC 47 12/13/2019 0000     Assessment & Plan:   1. Uncontrolled type 2 diabetes mellitus with hyperglycemia (HCC)  - Patient has currently uncontrolled symptomatic type 2 DM since  49 years of age.  .She presents with controlled fasting glycemic profile, however significantly above target postprandial glycemic profile.  Her point-of-care A1c is 10.1%.  She denies any hypoglycemic episodes.  -Her point-of-care A1c ranged between 10.1%-10.3% for the last 3 visits, although it has significantly improved from her previous A1c of 15.3% A1c.   - She does not report any gross complications  from her diabetes, however,  Vikkie Goeden remains at a high risk for more acute and chronic complications which include CAD, CVA, CKD, retinopathy, and neuropathy. These are all discussed in detail with the patient.  - I have counseled her on diet management and weight loss, by adopting a carbohydrate restricted/protein rich diet.  - she  admits there is a room for improvement in her diet and drink choices. -  Suggestion is made for her to avoid simple carbohydrates  from her diet including Cakes, Sweet Desserts / Pastries, Ice Cream, Soda (diet and regular), Sweet Tea, Candies, Chips, Cookies, Sweet Pastries,  Store Bought Juices, Alcohol in Excess of  1-2 drinks a day, Artificial Sweeteners, Coffee Creamer, and "Sugar-free" Products. This will help patient to have stable blood glucose profile and potentially avoid unintended weight gain.   - I encouraged her to switch to  unprocessed or minimally processed complex starch and increased protein intake (animal or plant source), fruits, and vegetables.  - she is advised to stick to a routine mealtimes to eat 3 meals  a day and avoid unnecessary snacks ( to snack only to correct hypoglycemia).    - I have approached her with the following  individualized plan to manage diabetes and patient agrees:   -Based on her presentation with significantly above target glycemic profile, she will need intensive treatment with basal/bolus insulin in order for her to achieve control of diabetes to target.    -She is approached for prandial insulin and she accepts.   -She is advised to continue Levemir 80 units nightly, discussed and added NovoLog 8-14 units 3 times daily AC for Premeal blood glucose readings above 90 mg/dl associated with monitoring of blood glucose at least 2 times a day-daily before breakfast and at bedtime.   -  She will benefit from a CGM.  I discussed and prescribed freestyle libre device for her.  -She has tolerated low-dose glipizide, advised to continue glipizide 5 mg p.o. daily at breakfast.   She is also on Janumet 50/1000 mg p.o. twice daily after breakfast and after supper.   -She is advised to  call clinic for blood glucose levels less than 70 or above 200 mg /dl.   - Patient specific target  A1c;  LDL, HDL, Triglycerides,  were discussed in detail.  2) BP/HTN: Her blood pressure is controlled to target.  she is advised to continue current medications including lisinopril 10 mg p.o. daily.     3) Lipids/HPL: Her recent lipid panel showed good control LDL at 47.  She is advised to continue atorvastatin 10 mg p.o. nightly.     4)  Weight/Diet: Her BMI is 29-she is a candidate for modest weight loss.  CDE Consult has been  re-initiated , exercise, and detailed carbohydrates information provided. 5) vitamin D deficiency: Her 25-hydroxy vitamin D remains low at 15.  She will benefit from retreatment with vitamin D2 50,000 units weekly for 12 weeks.     6) Chronic Care/Health Maintenance:  -she  is on ACEI medications and  is encouraged to continue to follow up with Ophthalmology, Dentist,  Podiatrist at least yearly or according to recommendations, and advised to   stay away from smoking. I have recommended yearly  flu vaccine and pneumonia vaccination at least every 5 years; moderate intensity exercise for up to 150 minutes weekly; and  sleep for at least 7 hours a day.   - I advised patient to maintain close follow up with Craig Staggers, MD for primary care needs.  - Time spent on this patient care encounter:  35 min, of which > 50% was spent in  counseling and the rest reviewing her blood glucose logs , discussing her hypoglycemia and hyperglycemia episodes, reviewing her current and  previous labs / studies  ( including abstraction from other facilities) and medications  doses and developing a  long term treatment plan and documenting her care.   Please refer to Patient Instructions for Blood Glucose Monitoring and Insulin/Medications Dosing Guide"  in media tab for additional information. Please  also refer to " Patient Self Inventory" in the Media  tab for reviewed elements of pertinent patient history.  [name] participated in the discussions, expressed understanding, and voiced agreement with the above plans.  All questions were answered to her satisfaction. she is encouraged to contact clinic should she have any questions or concerns prior to her return visit.   Follow up plan: - Return in about 4 weeks (around 03/05/2020) for F/U with Meter and Logs Only - no Labs.  Marquis Lunch, MD Phone: [phone]  Fax: [phone]   02/06/2020, 5:27 PM This note was partially dictated with voice recognition software. Similar sounding words can be transcribed inadequately or may not  be corrected upon review.

## 2020-02-06 NOTE — Patient Instructions (Signed)
Goals  Set alarm to test blood sugars before breakfast daily. Exercise 15 minutes a day. Get A1C down to 8% or lower. Drink 4-5 bottles of water per day.

## 2020-03-06 ENCOUNTER — Ambulatory Visit (INDEPENDENT_AMBULATORY_CARE_PROVIDER_SITE_OTHER): Payer: BC Managed Care – PPO | Admitting: "Endocrinology

## 2020-03-06 ENCOUNTER — Encounter: Payer: Self-pay | Admitting: "Endocrinology

## 2020-03-06 ENCOUNTER — Other Ambulatory Visit: Payer: Self-pay

## 2020-03-06 VITALS — BP 114/79 | HR 85 | Ht 65.0 in | Wt 182.0 lb

## 2020-03-06 DIAGNOSIS — E1165 Type 2 diabetes mellitus with hyperglycemia: Secondary | ICD-10-CM | POA: Diagnosis not present

## 2020-03-06 DIAGNOSIS — I10 Essential (primary) hypertension: Secondary | ICD-10-CM

## 2020-03-06 DIAGNOSIS — E782 Mixed hyperlipidemia: Secondary | ICD-10-CM

## 2020-03-06 DIAGNOSIS — E559 Vitamin D deficiency, unspecified: Secondary | ICD-10-CM | POA: Diagnosis not present

## 2020-03-06 MED ORDER — LEVEMIR FLEXTOUCH 100 UNIT/ML ~~LOC~~ SOPN: 60.0000 [IU] | PEN_INJECTOR | Freq: Every evening | SUBCUTANEOUS | 2 refills | Status: DC

## 2020-03-06 MED ORDER — INSULIN ASPART 100 UNIT/ML FLEXPEN: 6.0000 [IU] | PEN_INJECTOR | Freq: Three times a day (TID) | SUBCUTANEOUS | 2 refills | Status: DC

## 2020-03-06 NOTE — Progress Notes (Signed)
03/06/2020           Endocrinology follow-up note  Subjective:    Patient ID: [name], female    DOB: 08/24/1971.  she is being seen in follow-up for management of currently uncontrolled symptomatic diabetes requested by  Craig Staggers, MD.   Past Medical History:  Diagnosis Date   Diabetes mellitus without complication (HCC)    Infection    bladder infection   Ovarian cyst    Vaginal Pap smear, abnormal    laser removal of abn cells; normal since   Varicose veins during pregnancy, antepartum    Past Surgical History:  Procedure Laterality Date   CERVICAL CERCLAGE N/A 10/12/2014   Procedure: CERCLAGE CERVICAL;  Surgeon: Turner Daniels, MD;  Location: [facility];  Service: Gynecology;  Laterality: N/A;   OOPHORECTOMY     OVARIAN CYST REMOVAL     Social History   Socioeconomic History   Marital status: Married    Spouse name: Not on file   Number of children: Not on file   Years of education: Not on file   Highest education level: Not on file  Occupational History   Not on file  Tobacco Use   Smoking status: Never Smoker   Smokeless tobacco: Never Used  Vaping Use   Vaping Use: Never used  Substance and Sexual Activity   Alcohol use: Yes    Comment: rare, none with pregnancy   Drug use: No   Sexual activity: Not Currently    Birth control/protection: Pill    Comment: on pill when found out pregnant  Other Topics Concern   Not on file  Social History Narrative   Not on file   Social Determinants of Health   Financial Resource Strain:    Difficulty of Paying Living Expenses:   Food Insecurity:    Worried About Programme researcher, broadcasting/film/video in the Last Year:    Barista in the Last Year:   Transportation Needs:    Freight forwarder (Medical):    Lack of Transportation (Non-Medical):   Physical Activity:    Days of Exercise per Week:    Minutes of Exercise per Session:   Stress:    Feeling of Stress :   Social  Connections:    Frequency of Communication with Friends and Family:    Frequency of Social Gatherings with Friends and Family:    Attends Religious Services:    Active Member of Clubs or Organizations:    Attends Banker Meetings:    Marital Status:    Outpatient Encounter Medications as of 03/06/2020  Medication Sig   atorvastatin (LIPITOR) 10 MG tablet daily.   Continuous Blood Gluc Sensor (FREESTYLE LIBRE 14 DAY SENSOR) MISC Inject 1 each into the skin every 14 (fourteen) days. Use as directed.   glucose blood (ONETOUCH VERIO) test strip Use as instructed   insulin aspart (NOVOLOG) 100 UNIT/ML FlexPen Inject 6-12 Units into the skin 3 (three) times daily with meals.   insulin detemir (LEVEMIR FLEXTOUCH) 100 UNIT/ML FlexPen Inject 60 Units into the skin every evening.   Insulin Pen Needle (B-D ULTRAFINE III SHORT PEN) 31G X 8 MM MISC AS DIRECTED   JANUMET 50-1000 MG tablet TAKE 1 TABLET BY MOUTH TWICE A DAY   lisinopril (PRINIVIL,ZESTRIL) 10 MG tablet Take 10 mg by mouth daily.   Vitamin D, Ergocalciferol, (DRISDOL) 1.25 MG (50000 UNIT) CAPS capsule Take 50,000 Units by mouth once a week.   [DISCONTINUED]  glipiZIDE (GLUCOTROL XL) 5 MG 24 hr tablet TAKE 1 TABLET BY MOUTH EVERY DAY WITH BREAKFAST   [DISCONTINUED] insulin aspart (NOVOLOG) 100 UNIT/ML FlexPen Inject 8-14 Units into the skin 3 (three) times daily with meals.   [DISCONTINUED] insulin detemir (LEVEMIR FLEXTOUCH) 100 UNIT/ML FlexPen Inject 80 Units into the skin every evening.   No facility-administered encounter medications on file as of 03/06/2020.    ALLERGIES: No Known Allergies  VACCINATION STATUS: Immunization History  Administered Date(s) Administered   Tdap 01/22/2015    Diabetes She presents for her follow-up diabetic visit. She has type 2 diabetes mellitus. Onset time: She was diagnosed at approximate age of 49 years. Her disease course has been improving. There are no  hypoglycemic associated symptoms. Pertinent negatives for hypoglycemia include no confusion, headaches, pallor or seizures. Pertinent negatives for diabetes include no chest pain, no fatigue, no polydipsia, no polyphagia and no polyuria. There are no hypoglycemic complications. Symptoms are improving. Risk factors for coronary artery disease include dyslipidemia, diabetes mellitus, family history and sedentary lifestyle. Current diabetic treatment includes insulin injections and oral agent (dual therapy). She is compliant with treatment none of the time. Her weight is increasing steadily. She is following a generally unhealthy diet. When asked about meal planning, she reported none. She has had a previous visit with a dietitian. She participates in exercise intermittently. Her home blood glucose trend is decreasing steadily. Her breakfast blood glucose range is generally 130-140 mg/dl. Her lunch blood glucose range is generally 130-140 mg/dl. Her dinner blood glucose range is generally 130-140 mg/dl. Her bedtime blood glucose range is generally 130-140 mg/dl. Her overall blood glucose range is 130-140 mg/dl. (She presents with controlled glycemic profile with some hypoglycemia, using her CGM device average blood glucose is 142 for the last 14 days.  40% time range, 40% above range, 8% hypoglycemia.  Her most recent A1c was 10.1%.   ) An ACE inhibitor/angiotensin II receptor blocker is being taken. Eye exam is not current.  Hypertension This is a chronic problem. The current episode started more than 1 year ago. Pertinent negatives include no chest pain, headaches, palpitations or shortness of breath. Risk factors for coronary artery disease include dyslipidemia, diabetes mellitus and sedentary lifestyle. Past treatments include ACE inhibitors.     Review of systems  Constitutional: + Minimally fluctuating body weight,  current  Body mass index is 30.29 kg/m. , no fatigue, no subjective hyperthermia, no  subjective hypothermia Eyes: no blurry vision, no xerophthalmia ENT: no sore throat, no nodules palpated in throat, no dysphagia/odynophagia, no hoarseness Cardiovascular: no Chest Pain, no Shortness of Breath, no palpitations, no leg swelling Respiratory: no cough, no shortness of breath Gastrointestinal: no Nausea/Vomiting/Diarhhea Musculoskeletal: no muscle/joint aches Skin: no rashes, no hyperemia Neurological: no tremors, no numbness, no tingling, no dizziness Psychiatric: no depression, no anxiety    Objective:    BP 114/79    Pulse 85    Ht 5\' 5"  (1.651 m)    Wt 182 lb (82.6 kg)    BMI 30.29 kg/m   Wt Readings from Last 3 Encounters:  03/06/20 182 lb (82.6 kg)  02/06/20 176 lb (79.8 kg)  02/06/20 176 lb (79.8 kg)      Physical Exam- Limited  Constitutional:  Body mass index is 30.29 kg/m. , not in acute distress, normal state of mind Eyes:  EOMI, no exophthalmos Neck: Supple Thyroid: No gross goiter Respiratory: Adequate breathing efforts Musculoskeletal: no gross deformities, strength intact in all four extremities, no gross  restriction of joint movements Skin:  no rashes, no hyperemia Neurological: no tremor with outstretched hands,     CMP ( most recent) CMP     Component Value Date/Time   NA 137 06/12/2019 0900   K 5.1 06/12/2019 0900   CL 99 06/12/2019 0900   CO2 23 06/12/2019 0900   GLUCOSE 373 (H) 06/12/2019 0900   GLUCOSE 79 01/14/2015 1720   BUN 11 12/13/2019 0000   CREATININE 0.8 12/13/2019 0000   CREATININE 0.87 06/12/2019 0900   CALCIUM 9.4 06/12/2019 0900   PROT 7.2 06/12/2019 0900   ALBUMIN 4.5 06/12/2019 0900   AST 11 06/12/2019 0900   ALT 14 06/12/2019 0900   ALKPHOS 37 (L) 06/12/2019 0900   BILITOT 0.9 06/12/2019 0900   GFRNONAA 81 06/12/2019 0900   GFRAA 93 06/12/2019 0900    Diabetic Labs (most recent): Lab Results  Component Value Date   HGBA1C 10.1 (A) 02/06/2020   HGBA1C 10.3 (A) 10/04/2019   HGBA1C 10.2 (H) 06/12/2019      Lipid Panel ( most recent) Lipid Panel     Component Value Date/Time   CHOL 113 12/13/2019 0000   TRIG 81 12/13/2019 0000   HDL 50 12/13/2019 0000   LDLCALC 47 12/13/2019 0000     Assessment & Plan:   1. Uncontrolled type 2 diabetes mellitus with hyperglycemia (HCC)  - Patient has currently uncontrolled symptomatic type 2 DM since  49 years of age.  -She presents with controlled glycemic profile with some hypoglycemia, using her CGM device average blood glucose is 142 for the last 14 days.  40% time range, 40% above range, 8% hypoglycemia.  Her most recent A1c was 10.1%.   -Her CGM findings were discussed with her.   - She does not report any gross complications  from her diabetes, however,  Kryssa Risenhoover remains at a high risk for more acute and chronic complications which include CAD, CVA, CKD, retinopathy, and neuropathy. These are all discussed in detail with the patient.  - I have counseled her on diet management and weight loss, by adopting a carbohydrate restricted/protein rich diet.  - she  admits there is a room for improvement in her diet and drink choices. -  Suggestion is made for her to avoid simple carbohydrates  from her diet including Cakes, Sweet Desserts / Pastries, Ice Cream, Soda (diet and regular), Sweet Tea, Candies, Chips, Cookies, Sweet Pastries,  Store Bought Juices, Alcohol in Excess of  1-2 drinks a day, Artificial Sweeteners, Coffee Creamer, and "Sugar-free" Products. This will help patient to have stable blood glucose profile and potentially avoid unintended weight gain.   - I encouraged her to switch to  unprocessed or minimally processed complex starch and increased protein intake (animal or plant source), fruits, and vegetables.  - she is advised to stick to a routine mealtimes to eat 3 meals  a day and avoid unnecessary snacks ( to snack only to correct hypoglycemia).    - I have approached her with the following individualized plan to manage  diabetes and patient agrees:   -Based on her presentation with significantly improved glycemic profile, she will need adjustment in her insulin doses.    -She is advised to lower her Levemir to 60 units nightly, discussed and lowered NovoLog to 6-12 units 3 times daily AC for Premeal blood glucose readings above 90 mg/dl associated with monitoring of blood glucose at least 2 times a day-daily before breakfast and at bedtime.   -She and  benefiting from her CGM device.  She is advised to wear it at all times.    -She is advised to discontinue glipizide at this time.     She is advised to continue on Janumet 50/1000 mg p.o. twice daily after breakfast and after supper.   -She is advised to  call clinic for blood glucose levels less than 70 or above 200 mg /dl.   - Patient specific target  A1c;  LDL, HDL, Triglycerides,  were discussed in detail.  2) BP/HTN:  Her blood pressure is controlled to target.  she is advised to continue current medications including lisinopril 10 mg p.o. daily.     3) Lipids/HPL: Her recent lipid panel showed good control LDL at 47.  She is advised to continue atorvastatin 10 mg p.o. nightly.  Side effects and precautions discussed with her.      4)  Weight/Diet: Her BMI is 30.2 - she is a candidate for modest weight loss.  CDE Consult has been  re-initiated , exercise, and detailed carbohydrates information provided. 5) vitamin D deficiency: Her 25-hydroxy vitamin D remains low at 15.  She he is advised to continue vitamin D2 50,000 units weekly for 12 weeks.     6) Chronic Care/Health Maintenance:  -she  is on ACEI medications and  is encouraged to continue to follow up with Ophthalmology, Dentist,  Podiatrist at least yearly or according to recommendations, and advised to   stay away from smoking. I have recommended yearly flu vaccine and pneumonia vaccination at least every 5 years; moderate intensity exercise for up to 150 minutes weekly; and  sleep for at  least 7 hours a day.   - I advised patient to maintain close follow up with Dairl Ponder, MD for primary care needs.  - Time spent on this patient care encounter:  35 min, of which > 50% was spent in  counseling and the rest reviewing her blood glucose logs , discussing her hypoglycemia and hyperglycemia episodes, reviewing her current and  previous labs / studies  ( including abstraction from other facilities) and medications  doses and developing a  long term treatment plan and documenting her care.   Please refer to Patient Instructions for Blood Glucose Monitoring and Insulin/Medications Dosing Guide"  in media tab for additional information. Please  also refer to " Patient Self Inventory" in the Media  tab for reviewed elements of pertinent patient history.  Hendricks Limes participated in the discussions, expressed understanding, and voiced agreement with the above plans.  All questions were answered to her satisfaction. she is encouraged to contact clinic should she have any questions or concerns prior to her return visit.    Follow up plan: - Return in about 9 weeks (around 05/08/2020) for Bring Meter and Logs- A1c in Office.  Glade Lloyd, MD Phone: [phone]  Fax: [phone]   03/06/2020, 12:20 PM This note was partially dictated with voice recognition software. Similar sounding words can be transcribed inadequately or may not  be corrected upon review.

## 2020-03-06 NOTE — Patient Instructions (Signed)

## 2020-03-12 ENCOUNTER — Other Ambulatory Visit: Payer: Self-pay | Admitting: "Endocrinology

## 2020-03-17 ENCOUNTER — Other Ambulatory Visit: Payer: Self-pay | Admitting: "Endocrinology

## 2020-05-08 ENCOUNTER — Encounter: Payer: Self-pay | Admitting: "Endocrinology

## 2020-05-08 ENCOUNTER — Ambulatory Visit: Payer: BC Managed Care – PPO | Admitting: "Endocrinology

## 2020-05-08 ENCOUNTER — Other Ambulatory Visit: Payer: Self-pay

## 2020-05-08 VITALS — BP 96/68 | HR 92 | Ht 65.0 in | Wt 186.4 lb

## 2020-05-08 DIAGNOSIS — E559 Vitamin D deficiency, unspecified: Secondary | ICD-10-CM | POA: Diagnosis not present

## 2020-05-08 DIAGNOSIS — E782 Mixed hyperlipidemia: Secondary | ICD-10-CM

## 2020-05-08 DIAGNOSIS — E1165 Type 2 diabetes mellitus with hyperglycemia: Secondary | ICD-10-CM | POA: Diagnosis not present

## 2020-05-08 DIAGNOSIS — I10 Essential (primary) hypertension: Secondary | ICD-10-CM

## 2020-05-08 LAB — POCT GLYCOSYLATED HEMOGLOBIN (HGB A1C): Hemoglobin A1C: 8.3 % — AB (ref 4.0–5.6)

## 2020-05-08 MED ORDER — LEVEMIR FLEXTOUCH 100 UNIT/ML ~~LOC~~ SOPN: 50.0000 [IU] | PEN_INJECTOR | Freq: Every day | SUBCUTANEOUS | 1 refills | Status: DC

## 2020-05-08 NOTE — Progress Notes (Signed)
05/08/2020           Endocrinology follow-up note  Subjective:    Patient ID: [name], female    DOB: 07-01-1971.  she is being seen in follow-up for management of currently uncontrolled symptomatic diabetes requested by  Craig Staggers, MD.   Past Medical History:  Diagnosis Date  . Diabetes mellitus without complication (HCC)   . Infection    bladder infection  . Ovarian cyst   . Vaginal Pap smear, abnormal    laser removal of abn cells; normal since  . Varicose veins during pregnancy, antepartum    Past Surgical History:  Procedure Laterality Date  . CERVICAL CERCLAGE N/A 10/12/2014   Procedure: CERCLAGE CERVICAL;  Surgeon: Turner Daniels, MD;  Location: [facility];  Service: Gynecology;  Laterality: N/A;  . OOPHORECTOMY    . OVARIAN CYST REMOVAL     Social History   Socioeconomic History  . Marital status: Married    Spouse name: Not on file  . Number of children: Not on file  . Years of education: Not on file  . Highest education level: Not on file  Occupational History  . Not on file  Tobacco Use  . Smoking status: Never Smoker  . Smokeless tobacco: Never Used  Vaping Use  . Vaping Use: Never used  Substance and Sexual Activity  . Alcohol use: Yes    Comment: rare, none with pregnancy  . Drug use: No  . Sexual activity: Not Currently    Birth control/protection: Pill    Comment: on pill when found out pregnant  Other Topics Concern  . Not on file  Social History Narrative  . Not on file   Social Determinants of Health   Financial Resource Strain:   . Difficulty of Paying Living Expenses:   Food Insecurity:   . Worried About Programme researcher, broadcasting/film/video in the Last Year:   . Barista in the Last Year:   Transportation Needs:   . Freight forwarder (Medical):   Marland Kitchen Lack of Transportation (Non-Medical):   Physical Activity:   . Days of Exercise per Week:   . Minutes of Exercise per Session:   Stress:   . Feeling of Stress :   Social  Connections:   . Frequency of Communication with Friends and Family:   . Frequency of Social Gatherings with Friends and Family:   . Attends Religious Services:   . Active Member of Clubs or Organizations:   . Attends Banker Meetings:   Marland Kitchen Marital Status:    Outpatient Encounter Medications as of 05/08/2020  Medication Sig  . atorvastatin (LIPITOR) 10 MG tablet daily.  . Continuous Blood Gluc Sensor (FREESTYLE LIBRE 14 DAY SENSOR) MISC Inject 1 each into the skin every 14 (fourteen) days. Use as directed.  Marland Kitchen glucose blood (ONETOUCH VERIO) test strip Use as instructed  . insulin aspart (NOVOLOG) 100 UNIT/ML FlexPen Inject 6-12 Units into the skin 3 (three) times daily with meals.  . insulin detemir (LEVEMIR FLEXTOUCH) 100 UNIT/ML FlexPen Inject 50 Units into the skin at bedtime.  . Insulin Pen Needle (B-D ULTRAFINE III SHORT PEN) 31G X 8 MM MISC AS DIRECTED  . JANUMET 50-1000 MG tablet TAKE 1 TABLET BY MOUTH TWICE A DAY  . lisinopril (PRINIVIL,ZESTRIL) 10 MG tablet Take 10 mg by mouth daily.  . Vitamin D, Ergocalciferol, (DRISDOL) 1.25 MG (50000 UNIT) CAPS capsule Take 50,000 Units by mouth once a week.  . [DISCONTINUED]  insulin detemir (LEVEMIR FLEXTOUCH) 100 UNIT/ML FlexPen Inject 60 Units into the skin every evening.   No facility-administered encounter medications on file as of 05/08/2020.    ALLERGIES: No Known Allergies  VACCINATION STATUS: Immunization History  Administered Date(s) Administered  . Tdap 01/22/2015    Diabetes She presents for her follow-up diabetic visit. She has type 2 diabetes mellitus. Onset time: She was diagnosed at approximate age of 62 years. Her disease course has been improving. There are no hypoglycemic associated symptoms. Pertinent negatives for hypoglycemia include no confusion, headaches, pallor or seizures. Pertinent negatives for diabetes include no chest pain, no fatigue, no polydipsia, no polyphagia and no polyuria. There are no  hypoglycemic complications. Symptoms are improving. Risk factors for coronary artery disease include dyslipidemia, diabetes mellitus, family history and sedentary lifestyle. Current diabetic treatment includes insulin injections and oral agent (dual therapy). She is compliant with treatment none of the time. Her weight is increasing steadily. She is following a generally unhealthy diet. When asked about meal planning, she reported none. She has had a previous visit with a dietitian. She participates in exercise intermittently. Her home blood glucose trend is decreasing steadily. Her breakfast blood glucose range is generally 130-140 mg/dl. Her lunch blood glucose range is generally 140-180 mg/dl. Her dinner blood glucose range is generally 140-180 mg/dl. Her bedtime blood glucose range is generally 140-180 mg/dl. Her overall blood glucose range is 140-180 mg/dl. (She presents with her CGM device showing average blood glucose of 163 over the last 90 days, 33% time in range, 53% above range.  Her point-of-care A1c is 8.3% improving from 10.1%.  She had 3% hypoglycemia in the early morning hours.    ) An ACE inhibitor/angiotensin II receptor blocker is being taken. Eye exam is not current.  Hypertension This is a chronic problem. The current episode started more than 1 year ago. Pertinent negatives include no chest pain, headaches, palpitations or shortness of breath. Risk factors for coronary artery disease include dyslipidemia, diabetes mellitus and sedentary lifestyle. Past treatments include ACE inhibitors.     Review of systems  Constitutional: + Minimally fluctuating body weight,  current  Body mass index is 31.02 kg/m. , no fatigue, no subjective hyperthermia, no subjective hypothermia Eyes: no blurry vision, no xerophthalmia ENT: no sore throat, no nodules palpated in throat, no dysphagia/odynophagia, no hoarseness Cardiovascular: no Chest Pain, no Shortness of Breath, no palpitations, no leg  swelling Respiratory: no cough, no shortness of breath Gastrointestinal: no Nausea/Vomiting/Diarhhea Musculoskeletal: no muscle/joint aches Skin: no rashes, no hyperemia Neurological: no tremors, no numbness, no tingling, no dizziness Psychiatric: no depression, no anxiety    Objective:    BP 96/68   Pulse 92   Ht 5\' 5"  (1.651 m)   Wt 186 lb 6.4 oz (84.6 kg)   BMI 31.02 kg/m   Wt Readings from Last 3 Encounters:  05/08/20 186 lb 6.4 oz (84.6 kg)  03/06/20 182 lb (82.6 kg)  02/06/20 176 lb (79.8 kg)      Physical Exam- Limited  Constitutional:  Body mass index is 31.02 kg/m. , not in acute distress, normal state of mind Eyes:  EOMI, no exophthalmos Neck: Supple Thyroid: No gross goiter Respiratory: Adequate breathing efforts Musculoskeletal: no gross deformities, strength intact in all four extremities, no gross restriction of joint movements Skin:  no rashes, no hyperemia Neurological: no tremor with outstretched hands    CMP ( most recent) CMP     Component Value Date/Time   NA 137 06/12/2019  0900   K 5.1 06/12/2019 0900   CL 99 06/12/2019 0900   CO2 23 06/12/2019 0900   GLUCOSE 373 (H) 06/12/2019 0900   GLUCOSE 79 01/14/2015 1720   BUN 11 12/13/2019 0000   CREATININE 0.8 12/13/2019 0000   CREATININE 0.87 06/12/2019 0900   CALCIUM 9.4 06/12/2019 0900   PROT 7.2 06/12/2019 0900   ALBUMIN 4.5 06/12/2019 0900   AST 11 06/12/2019 0900   ALT 14 06/12/2019 0900   ALKPHOS 37 (L) 06/12/2019 0900   BILITOT 0.9 06/12/2019 0900   GFRNONAA 81 06/12/2019 0900   GFRAA 93 06/12/2019 0900    Diabetic Labs (most recent): Lab Results  Component Value Date   HGBA1C 8.3 (A) 05/08/2020   HGBA1C 10.1 (A) 02/06/2020   HGBA1C 10.3 (A) 10/04/2019     Lipid Panel ( most recent) Lipid Panel     Component Value Date/Time   CHOL 113 12/13/2019 0000   TRIG 81 12/13/2019 0000   HDL 50 12/13/2019 0000   LDLCALC 47 12/13/2019 0000     Assessment & Plan:   1.  Uncontrolled type 2 diabetes mellitus with hyperglycemia (HCC)  - Patient has currently uncontrolled symptomatic type 2 DM since  49 years of age.  She presents with her CGM device showing average blood glucose of 163 over the last 90 days, 33% time in range, 53% above range.  Her point-of-care A1c is 8.3% improving from 10.1%.  She had 3% hypoglycemia in the early morning hours.  Her CGM findings were discussed in detail with her.   - She does not report any gross complications  from her diabetes, however,  Gina HandyWendy [name] remains at a high risk for more acute and chronic complications which include CAD, CVA, CKD, retinopathy, and neuropathy. These are all discussed in detail with the patient.  - I have counseled her on diet management and weight loss, by adopting a carbohydrate restricted/protein rich diet.  - she  admits there is a room for improvement in her diet and drink choices. -  Suggestion is made for her to avoid simple carbohydrates  from her diet including Cakes, Sweet Desserts / Pastries, Ice Cream, Soda (diet and regular), Sweet Tea, Candies, Chips, Cookies, Sweet Pastries,  Store Bought Juices, Alcohol in Excess of  1-2 drinks a day, Artificial Sweeteners, Coffee Creamer, and "Sugar-free" Products. This will help patient to have stable blood glucose profile and potentially avoid unintended weight gain.   - I encouraged her to switch to  unprocessed or minimally processed complex starch and increased protein intake (animal or plant source), fruits, and vegetables.  - she is advised to stick to a routine mealtimes to eat 3 meals  a day and avoid unnecessary snacks ( to snack only to correct hypoglycemia).    - I have approached her with the following individualized plan to manage diabetes and patient agrees:   -Based on her presentation with significantly improved glycemic profile, she will continue to need adjustment in her insulin doses.    -In light of some overnight  hypoglycemia, she is advised to lower her Levemir to 50 units nightly, continue NovoLog 6-12 units 3 times daily AC for Premeal blood glucose readings above 90 mg/dl associated with monitoring of blood glucose at least 2 times a day-daily before breakfast and at bedtime.   -She and benefiting from her CGM device.  She is advised to wear it at all times.     She is advised to continue on Janumet 50/1000  mg p.o. twice daily after breakfast and after supper.   -She is advised to  call clinic for blood glucose levels less than 70 or above 200 mg /dl.   - Patient specific target  A1c;  LDL, HDL, Triglycerides,  were discussed in detail.  2) BP/HTN:  Her blood pressure is controlled to target.  she is advised to continue current medications including lisinopril 10 mg p.o. daily.     3) Lipids/HPL: Her recent lipid panel showed good control LDL at 47.  She is advised to continue atorvastatin 10 mg p.o. nightly.    Side effects and precautions discussed with her.      4)  Weight/Diet: Her BMI is 31.02 - she is a candidate for modest weight loss.  CDE Consult has been  re-initiated , exercise, and detailed carbohydrates information provided. 5) vitamin D deficiency: Her 25-hydroxy vitamin D remains low at 15.  She he is advised to continue vitamin D2 50,000 units weekly for 12 weeks.     6) Chronic Care/Health Maintenance:  -she  is on ACEI medications and  is encouraged to continue to follow up with Ophthalmology, Dentist,  Podiatrist at least yearly or according to recommendations, and advised to   stay away from smoking. I have recommended yearly flu vaccine and pneumonia vaccination at least every 5 years; moderate intensity exercise for up to 150 minutes weekly; and  sleep for at least 7 hours a day.   - I advised patient to maintain close follow up with Craig Staggers, MD for primary care needs.  - Time spent on this patient care encounter:  35 min, of which > 50% was spent in   counseling and the rest reviewing her blood glucose logs , discussing her hypoglycemia and hyperglycemia episodes, reviewing her current and  previous labs / studies  ( including abstraction from other facilities) and medications  doses and developing a  long term treatment plan and documenting her care.   Please refer to Patient Instructions for Blood Glucose Monitoring and Insulin/Medications Dosing Guide"  in media tab for additional information. Please  also refer to " Patient Self Inventory" in the Media  tab for reviewed elements of pertinent patient history.  [name] participated in the discussions, expressed understanding, and voiced agreement with the above plans.  All questions were answered to her satisfaction. she is encouraged to contact clinic should she have any questions or concerns prior to her return visit.    Follow up plan: - Return in about 3 months (around 08/08/2020) for NV with [name], F/U with Pre-visit Labs, Meter, Logs, A1c here.Marquis Lunch, MD Phone: [phone]  Fax: [phone]   05/08/2020, 12:31 PM This note was partially dictated with voice recognition software. Similar sounding words can be transcribed inadequately or may not  be corrected upon review.

## 2020-05-08 NOTE — Patient Instructions (Signed)

## 2020-05-20 ENCOUNTER — Other Ambulatory Visit: Payer: Self-pay | Admitting: Obstetrics and Gynecology

## 2020-05-20 DIAGNOSIS — R928 Other abnormal and inconclusive findings on diagnostic imaging of breast: Secondary | ICD-10-CM

## 2020-05-23 ENCOUNTER — Other Ambulatory Visit: Payer: Self-pay | Admitting: "Endocrinology

## 2020-06-03 ENCOUNTER — Ambulatory Visit: Payer: BC Managed Care – PPO

## 2020-06-03 ENCOUNTER — Other Ambulatory Visit: Payer: Self-pay

## 2020-06-03 ENCOUNTER — Ambulatory Visit
Admission: RE | Admit: 2020-06-03 | Discharge: 2020-06-03 | Disposition: A | Discharge status: Home / Self Care | Payer: BC Managed Care – PPO | Source: Ambulatory Visit | Attending: Obstetrics and Gynecology | Admitting: Obstetrics and Gynecology

## 2020-06-03 DIAGNOSIS — R928 Other abnormal and inconclusive findings on diagnostic imaging of breast: Secondary | ICD-10-CM

## 2020-08-02 LAB — COMPREHENSIVE METABOLIC PANEL
ALT: 17 IU/L (ref 0–32)
AST: 17 IU/L (ref 0–40)
Albumin/Globulin Ratio: 1.7 (ref 1.2–2.2)
Albumin: 4.5 g/dL (ref 3.8–4.8)
Alkaline Phosphatase: 37 IU/L — ABNORMAL LOW (ref 44–121)
BUN/Creatinine Ratio: 12 (ref 9–23)
BUN: 9 mg/dL (ref 6–24)
Bilirubin Total: 0.7 mg/dL (ref 0.0–1.2)
CO2: 21 mmol/L (ref 20–29)
Calcium: 9.8 mg/dL (ref 8.7–10.2)
Chloride: 98 mmol/L (ref 96–106)
Creatinine, Ser: 0.73 mg/dL (ref 0.57–1.00)
GFR calc Af Amer: 113 mL/min/{1.73_m2} (ref >59)
GFR calc non Af Amer: 98 mL/min/{1.73_m2} (ref >59)
Globulin, Total: 2.7 g/dL (ref 1.5–4.5)
Glucose: 157 mg/dL — ABNORMAL HIGH (ref 65–99)
Potassium: 4.9 mmol/L (ref 3.5–5.2)
Sodium: 138 mmol/L (ref 134–144)
Total Protein: 7.2 g/dL (ref 6.0–8.5)

## 2020-08-02 LAB — HEMOGLOBIN A1C
Est. average glucose Bld gHb Est-mCnc: 249 mg/dL
Hgb A1c MFr Bld: 10.3 % — ABNORMAL HIGH (ref 4.8–5.6)

## 2020-08-02 LAB — T4, FREE: Free T4: 1.18 ng/dL (ref 0.82–1.77)

## 2020-08-02 LAB — LIPID PANEL
Chol/HDL Ratio: 2.4 ratio (ref 0.0–4.4)
Cholesterol, Total: 126 mg/dL (ref 100–199)
HDL: 52 mg/dL (ref >39)
LDL Chol Calc (NIH): 53 mg/dL (ref 0–99)
Triglycerides: 119 mg/dL (ref 0–149)
VLDL Cholesterol Cal: 21 mg/dL (ref 5–40)

## 2020-08-02 LAB — TSH: TSH: 1.04 u[IU]/mL (ref 0.450–4.500)

## 2020-08-08 ENCOUNTER — Ambulatory Visit (INDEPENDENT_AMBULATORY_CARE_PROVIDER_SITE_OTHER): Payer: BC Managed Care – PPO | Admitting: Nurse Practitioner

## 2020-08-08 ENCOUNTER — Encounter: Payer: Self-pay | Admitting: Nurse Practitioner

## 2020-08-08 ENCOUNTER — Telehealth: Payer: BC Managed Care – PPO | Admitting: Nurse Practitioner

## 2020-08-08 ENCOUNTER — Other Ambulatory Visit: Payer: Self-pay

## 2020-08-08 VITALS — BP 131/65 | HR 91 | Ht 65.0 in | Wt 179.0 lb

## 2020-08-08 DIAGNOSIS — I10 Essential (primary) hypertension: Secondary | ICD-10-CM

## 2020-08-08 DIAGNOSIS — E1165 Type 2 diabetes mellitus with hyperglycemia: Secondary | ICD-10-CM | POA: Diagnosis not present

## 2020-08-08 DIAGNOSIS — E782 Mixed hyperlipidemia: Secondary | ICD-10-CM

## 2020-08-08 DIAGNOSIS — E559 Vitamin D deficiency, unspecified: Secondary | ICD-10-CM | POA: Diagnosis not present

## 2020-08-08 MED ORDER — LEVEMIR FLEXTOUCH 100 UNIT/ML ~~LOC~~ SOPN: 60.0000 [IU] | PEN_INJECTOR | Freq: Every day | SUBCUTANEOUS | 3 refills | Status: DC

## 2020-08-08 NOTE — Patient Instructions (Signed)

## 2020-08-08 NOTE — Progress Notes (Signed)
08/08/2020           Endocrinology follow-up note  Subjective:    Patient ID: [name], female    DOB: 1971-08-14.  she is being seen in follow-up for management of currently uncontrolled symptomatic diabetes requested by  Craig Staggers, MD.   Past Medical History:  Diagnosis Date  . Diabetes mellitus without complication (HCC)   . Infection    bladder infection  . Ovarian cyst   . Vaginal Pap smear, abnormal    laser removal of abn cells; normal since  . Varicose veins during pregnancy, antepartum    Past Surgical History:  Procedure Laterality Date  . CERVICAL CERCLAGE N/A 10/12/2014   Procedure: CERCLAGE CERVICAL;  Surgeon: Turner Daniels, MD;  Location: [facility];  Service: Gynecology;  Laterality: N/A;  . OOPHORECTOMY    . OVARIAN CYST REMOVAL     Social History   Socioeconomic History  . Marital status: Married    Spouse name: Not on file  . Number of children: Not on file  . Years of education: Not on file  . Highest education level: Not on file  Occupational History  . Not on file  Tobacco Use  . Smoking status: Never Smoker  . Smokeless tobacco: Never Used  Vaping Use  . Vaping Use: Never used  Substance and Sexual Activity  . Alcohol use: Yes    Comment: rare, none with pregnancy  . Drug use: No  . Sexual activity: Not Currently    Birth control/protection: Pill    Comment: on pill when found out pregnant  Other Topics Concern  . Not on file  Social History Narrative  . Not on file   Social Determinants of Health   Financial Resource Strain:   . Difficulty of Paying Living Expenses: Not on file  Food Insecurity:   . Worried About Programme researcher, broadcasting/film/video in the Last Year: Not on file  . Ran Out of Food in the Last Year: Not on file  Transportation Needs:   . Lack of Transportation (Medical): Not on file  . Lack of Transportation (Non-Medical): Not on file  Physical Activity:   . Days of Exercise per Week: Not on file  . Minutes of  Exercise per Session: Not on file  Stress:   . Feeling of Stress : Not on file  Social Connections:   . Frequency of Communication with Friends and Family: Not on file  . Frequency of Social Gatherings with Friends and Family: Not on file  . Attends Religious Services: Not on file  . Active Member of Clubs or Organizations: Not on file  . Attends Banker Meetings: Not on file  . Marital Status: Not on file   Outpatient Encounter Medications as of 08/08/2020  Medication Sig  . atorvastatin (LIPITOR) 10 MG tablet daily.  . Continuous Blood Gluc Sensor (FREESTYLE LIBRE 14 DAY SENSOR) MISC PLACE 1 SENSOR INTO THE SKIN EVERY 14 (FOURTEEN) DAYS. USE AS DIRECTED.  Marland Kitchen glucose blood (ONETOUCH VERIO) test strip Use as instructed  . insulin aspart (NOVOLOG) 100 UNIT/ML FlexPen Inject 6-12 Units into the skin 3 (three) times daily with meals.  . insulin detemir (LEVEMIR FLEXTOUCH) 100 UNIT/ML FlexPen Inject 50 Units into the skin at bedtime.  . Insulin Pen Needle (B-D ULTRAFINE III SHORT PEN) 31G X 8 MM MISC AS DIRECTED  . JANUMET 50-1000 MG tablet TAKE 1 TABLET BY MOUTH TWICE A DAY  . levonorgestrel (MIRENA, 52 MG,) 20 MCG/24HR  IUD Mirena 20 mcg/24 hours (7 yrs) 52 mg intrauterine device  . lisinopril (PRINIVIL,ZESTRIL) 10 MG tablet Take 10 mg by mouth daily.  . Vitamin D, Ergocalciferol, (DRISDOL) 1.25 MG (50000 UNIT) CAPS capsule Take 50,000 Units by mouth once a week.   No facility-administered encounter medications on file as of 08/08/2020.    ALLERGIES: No Known Allergies  VACCINATION STATUS: Immunization History  Administered Date(s) Administered  . Tdap 01/22/2015    Diabetes She presents for her follow-up diabetic visit. She has type 2 diabetes mellitus. Onset time: She was diagnosed at approximate age of 35 years. Her disease course has been worsening. There are no hypoglycemic associated symptoms. Pertinent negatives for hypoglycemia include no confusion, headaches,  pallor or seizures. Associated symptoms include polyuria. Pertinent negatives for diabetes include no chest pain, no fatigue, no polydipsia and no polyphagia. There are no hypoglycemic complications. Symptoms are worsening. There are no diabetic complications. Risk factors for coronary artery disease include dyslipidemia, diabetes mellitus, family history, sedentary lifestyle, obesity and hypertension. Current diabetic treatment includes insulin injections and oral agent (dual therapy). She is compliant with treatment some of the time. Her weight is decreasing steadily. She is following a generally unhealthy diet. When asked about meal planning, she reported none. She has had a previous visit with a dietitian. She participates in exercise intermittently. Her home blood glucose trend is increasing steadily. (She presents today with her CGM and logs, showing significantly worse glycemic profile both fasting and postprandially.  Her previsit A1c was 10.3%, increasing from 8.3%.  She has been under the stress of building a new house and has not done well with her diet lately.  Analysis of her CGM shows TIR 8%, TAR 89%, TBR 3%.  She denies any significant hypoglycemia.) An ACE inhibitor/angiotensin II receptor blocker is being taken. She does not see a podiatrist.Eye exam is current.  Hypertension This is a chronic problem. The current episode started more than 1 year ago. The problem has been gradually improving since onset. The problem is controlled. Pertinent negatives include no chest pain, headaches, palpitations or shortness of breath. There are no associated agents to hypertension. Risk factors for coronary artery disease include dyslipidemia, diabetes mellitus and sedentary lifestyle. Past treatments include ACE inhibitors. The current treatment provides moderate improvement. There are no compliance problems.   Hyperlipidemia This is a chronic problem. The current episode started more than 1 year ago. The  problem is controlled. Recent lipid tests were reviewed and are normal. Exacerbating diseases include diabetes and obesity. There are no known factors aggravating her hyperlipidemia. Pertinent negatives include no chest pain or shortness of breath. Current antihyperlipidemic treatment includes statins. The current treatment provides moderate improvement of lipids. There are no compliance problems.  Risk factors for coronary artery disease include diabetes mellitus, dyslipidemia, hypertension and obesity.     Review of systems  Constitutional: + Minimally fluctuating body weight,  current Body mass index is 29.79 kg/m. , no fatigue, no subjective hyperthermia, no subjective hypothermia Eyes: no blurry vision, no xerophthalmia ENT: no sore throat, no nodules palpated in throat, no dysphagia/odynophagia, no hoarseness Cardiovascular: no chest pain, no shortness of breath, no palpitations, no leg swelling Respiratory: no cough, no shortness of breath Gastrointestinal: no nausea/vomiting/diarrhea Musculoskeletal: no muscle/joint aches Skin: no rashes, no hyperemia Neurological: no tremors, no numbness, no tingling, no dizziness Psychiatric: no depression, no anxiety    Objective:    BP 131/65 (BP Location: Left Arm)   Pulse 91   Ht 5'  5" (1.651 m)   Wt 179 lb (81.2 kg)   BMI 29.79 kg/m   Wt Readings from Last 3 Encounters:  08/08/20 179 lb (81.2 kg)  05/08/20 186 lb 6.4 oz (84.6 kg)  03/06/20 182 lb (82.6 kg)    BP Readings from Last 3 Encounters:  08/08/20 131/65  05/08/20 96/68  03/06/20 114/79     Physical Exam- Limited  Constitutional:  Body mass index is 29.79 kg/m. , not in acute distress, normal state of mind Eyes:  EOMI, no exophthalmos Neck: Supple Thyroid: No gross goiter Cardiovascular: RRR, no murmers, rubs, or gallops, no edema Respiratory: Adequate breathing efforts, no crackles, rales, rhonchi, or wheezing Musculoskeletal: no gross deformities, strength  intact in all four extremities, no gross restriction of joint movements Skin:  no rashes, no hyperemia Neurological: no tremor with outstretched hands  Foot exam:   No rashes, ulcers, cuts, onychodystrophy, + calluses   Good pulses bilat.  Good sensation to 10 g monofilament bilat.   POCT ABI Results 08/08/20   Right ABI:  1.08      Left ABI:  1.21  Right leg systolic / diastolic: 142/84 mmHg Left leg systolic / diastolic: 158/81 mmHg  Arm systolic / diastolic: 131/65 mmHG  Detailed report will be scanned into patient chart.     CMP ( most recent) CMP     Component Value Date/Time   NA 138 08/01/2020 1024   K 4.9 08/01/2020 1024   CL 98 08/01/2020 1024   CO2 21 08/01/2020 1024   GLUCOSE 157 (H) 08/01/2020 1024   GLUCOSE 79 01/14/2015 1720   BUN 9 08/01/2020 1024   CREATININE 0.73 08/01/2020 1024   CALCIUM 9.8 08/01/2020 1024   PROT 7.2 08/01/2020 1024   ALBUMIN 4.5 08/01/2020 1024   AST 17 08/01/2020 1024   ALT 17 08/01/2020 1024   ALKPHOS 37 (L) 08/01/2020 1024   BILITOT 0.7 08/01/2020 1024   GFRNONAA 98 08/01/2020 1024   GFRAA 113 08/01/2020 1024    Diabetic Labs (most recent): Lab Results  Component Value Date   HGBA1C 10.3 (H) 08/01/2020   HGBA1C 8.3 (A) 05/08/2020   HGBA1C 10.1 (A) 02/06/2020     Lipid Panel ( most recent) Lipid Panel     Component Value Date/Time   CHOL 126 08/01/2020 1024   TRIG 119 08/01/2020 1024   HDL 52 08/01/2020 1024   CHOLHDL 2.4 08/01/2020 1024   LDLCALC 53 08/01/2020 1024     Assessment & Plan:   1) Uncontrolled type 2 diabetes mellitus with hyperglycemia (HCC)  - Patient has currently uncontrolled symptomatic type 2 DM since  49 years of age.  She presents today with her CGM and logs, showing significantly worse glycemic profile both fasting and postprandially.  Her previsit A1c was 10.3%, increasing from 8.3%.  She has been under the stress of building a new house and has not done well with her diet lately.   Analysis of her CGM shows TIR 8%, TAR 89%, TBR 3%.  She denies any significant hypoglycemia.   - She does not report any gross complications  from her diabetes, however,  Harbor Paster remains at a high risk for more acute and chronic complications which include CAD, CVA, CKD, retinopathy, and neuropathy. These are all discussed in detail with the patient.  - Nutritional counseling repeated at each appointment due to patients tendency to fall back in to old habits.  - The patient admits there is a room for improvement in their  diet and drink choices. -  Suggestion is made for the patient to avoid simple carbohydrates from their diet including Cakes, Sweet Desserts / Pastries, Ice Cream, Soda (diet and regular), Sweet Tea, Candies, Chips, Cookies, Sweet Pastries,  Store Bought Juices, Alcohol in Excess of  1-2 drinks a day, Artificial Sweeteners, Coffee Creamer, and "Sugar-free" Products. This will help patient to have stable blood glucose profile and potentially avoid unintended weight gain.   - I encouraged the patient to switch to  unprocessed or minimally processed complex starch and increased protein intake (animal or plant source), fruits, and vegetables.   - Patient is advised to stick to a routine mealtimes to eat 3 meals  a day and avoid unnecessary snacks ( to snack only to correct hypoglycemia).  - I have approached her with the following individualized plan to manage diabetes and patient agrees:   -Given her recent loss of control, she is advised to increase her Levemir to 60 units SQ nightly, continue Novolog 6-12 units TID with meals if glucose is above 90 and she is eating.  Specific instructions on how to titrate insulin dose based on glucose readings given to patient in writing.  She is also advised to continue Janumet 50/1000 mg po twice daily.  -She is encouraged to continue monitoring blood glucose (with her CGM) at least 4 times daily, before meals and before bed, and to call  the clinic if she has readings less than 70 or greater than 300 for 3 tests in a row.  - Patient specific target  A1c;  LDL, HDL, Triglycerides,  were discussed in detail.  2) BP/HTN:  Her blood pressure is controlled to target.  She is advised to continue current medications including Lisinopril 10 mg p.o. daily.    3) Lipids/HPL:  Her most recent lipid panel from 08/01/20 shows controlled LDL at 53.  She is advised to continue Lipitor 10 mg po daily at bedtime.  Side effects and precautions discussed with her.  4)  Weight/Diet:  Her Body mass index is 29.79 kg/m. - she is a candidate for modest weight loss.  CDE Consult has been  re-initiated , exercise, and detailed carbohydrates information provided.  5) Vitamin D deficiency:  Her last vitamin D level on 12/13/19 was 15.  She has completed replenishment with Ergocalciferol.  She is not currently taking any supplementation.  Will consider rechecking vitamin D level on subsequent visits.  6) Chronic Care/Health Maintenance: -she  is on ACEI and Statin medications and  is encouraged to continue to follow up with Ophthalmology, Dentist,  Podiatrist at least yearly or according to recommendations, and advised to stay away from smoking. I have recommended yearly flu vaccine and pneumonia vaccination at least every 5 years; moderate intensity exercise for up to 150 minutes weekly; and  sleep for at least 7 hours a day.   - I advised patient to maintain close follow up with Craig StaggersVasireddy, Sabitha, MD for primary care needs.  - Time spent on this patient care encounter:  30 min, of which > 50% was spent in  counseling and the rest reviewing her blood glucose logs , discussing her hypoglycemia and hyperglycemia episodes, reviewing her current and  previous labs / studies  ( including abstraction from other facilities) and medications  doses and developing a  long term treatment plan and documenting her care.   Please refer to Patient Instructions for  Blood Glucose Monitoring and Insulin/Medications Dosing Guide"  in media tab for additional information.  Please  also refer to " Patient Self Inventory" in the Media  tab for reviewed elements of pertinent patient history.  [name] participated in the discussions, expressed understanding, and voiced agreement with the above plans.  All questions were answered to her satisfaction. she is encouraged to contact clinic should she have any questions or concerns prior to her return visit.    Follow up plan: - Return in about 3 months (around 11/08/2020) for Diabetes follow up- A1c and urine micro in office, No previsit labs, Bring glucometer and logs.  Ronny Bacon, [facility] [address] [place], [place] [address] Phone: [phone] Fax: [phone]  08/08/2020, 2:33 PM

## 2020-08-19 ENCOUNTER — Other Ambulatory Visit: Payer: Self-pay | Admitting: "Endocrinology

## 2020-09-19 ENCOUNTER — Other Ambulatory Visit: Payer: Self-pay | Admitting: "Endocrinology

## 2020-09-23 ENCOUNTER — Other Ambulatory Visit: Payer: Self-pay | Admitting: "Endocrinology

## 2020-11-08 ENCOUNTER — Ambulatory Visit: Payer: BC Managed Care – PPO | Admitting: Nurse Practitioner

## 2020-11-12 ENCOUNTER — Other Ambulatory Visit: Payer: Self-pay

## 2020-11-12 ENCOUNTER — Encounter: Payer: Self-pay | Admitting: Nurse Practitioner

## 2020-11-12 ENCOUNTER — Ambulatory Visit: Payer: BC Managed Care – PPO | Admitting: Nurse Practitioner

## 2020-11-12 VITALS — BP 121/75 | HR 88 | Ht 65.0 in | Wt 164.8 lb

## 2020-11-12 DIAGNOSIS — I10 Essential (primary) hypertension: Secondary | ICD-10-CM | POA: Diagnosis not present

## 2020-11-12 DIAGNOSIS — E1165 Type 2 diabetes mellitus with hyperglycemia: Secondary | ICD-10-CM

## 2020-11-12 DIAGNOSIS — E782 Mixed hyperlipidemia: Secondary | ICD-10-CM

## 2020-11-12 DIAGNOSIS — E559 Vitamin D deficiency, unspecified: Secondary | ICD-10-CM | POA: Diagnosis not present

## 2020-11-12 LAB — POCT UA - MICROALBUMIN: Microalbumin Ur, POC: 30 mg/L

## 2020-11-12 LAB — POCT GLYCOSYLATED HEMOGLOBIN (HGB A1C): HbA1c, POC (controlled diabetic range): 12 % — AB (ref 0.0–7.0)

## 2020-11-12 MED ORDER — GLIPIZIDE 5 MG PO TABS
5.0000 mg | ORAL_TABLET | Freq: Two times a day (BID) | ORAL | 1 refills | Status: DC
Start: 2020-11-12 — End: 2020-11-12

## 2020-11-12 MED ORDER — JANUMET 50-1000 MG PO TABS: 1.0000 | ORAL_TABLET | Freq: Two times a day (BID) | ORAL | 3 refills | Status: DC

## 2020-11-12 MED ORDER — LEVEMIR FLEXTOUCH 100 UNIT/ML ~~LOC~~ SOPN: 60.0000 [IU] | PEN_INJECTOR | Freq: Every day | SUBCUTANEOUS | 3 refills | Status: DC

## 2020-11-12 MED ORDER — GLIPIZIDE ER 5 MG PO TB24: 5.0000 mg | ORAL_TABLET | Freq: Every day | ORAL | 3 refills | Status: DC

## 2020-11-12 NOTE — Progress Notes (Signed)
11/12/2020           Endocrinology follow-up note  Subjective:    Patient ID: [name], female    DOB: [birth year].  she is being seen in follow-up for management of currently uncontrolled symptomatic diabetes requested by  Craig Staggers, MD.   Past Medical History:  Diagnosis Date  . Diabetes mellitus without complication (HCC)   . Infection    bladder infection  . Ovarian cyst   . Vaginal Pap smear, abnormal    laser removal of abn cells; normal since  . Varicose veins during pregnancy, antepartum    Past Surgical History:  Procedure Laterality Date  . CERVICAL CERCLAGE N/A 10/12/2014   Procedure: CERCLAGE CERVICAL;  Surgeon: Turner Daniels, MD;  Location: [facility];  Service: Gynecology;  Laterality: N/A;  . OOPHORECTOMY    . OVARIAN CYST REMOVAL     Social History   Socioeconomic History  . Marital status: Married    Spouse name: Not on file  . Number of children: Not on file  . Years of education: Not on file  . Highest education level: Not on file  Occupational History  . Not on file  Tobacco Use  . Smoking status: Never Smoker  . Smokeless tobacco: Never Used  Vaping Use  . Vaping Use: Never used  Substance and Sexual Activity  . Alcohol use: Yes    Comment: rare, none with pregnancy  . Drug use: No  . Sexual activity: Not Currently    Birth control/protection: Pill    Comment: on pill when found out pregnant  Other Topics Concern  . Not on file  Social History Narrative  . Not on file   Social Determinants of Health   Financial Resource Strain: Not on file  Food Insecurity: Not on file  Transportation Needs: Not on file  Physical Activity: Not on file  Stress: Not on file  Social Connections: Not on file   Outpatient Encounter Medications as of 11/12/2020  Medication Sig  . atorvastatin (LIPITOR) 10 MG tablet daily.  . Cholecalciferol (VITAMIN D) 50 MCG (2000 UT) tablet Take 2,000 Units by mouth daily.  . Continuous Blood Gluc Sensor  (FREESTYLE LIBRE 14 DAY SENSOR) MISC PLACE 1 SENSOR INTO THE SKIN EVERY 14 (FOURTEEN) DAYS. USE AS DIRECTED.  Marland Kitchen glipiZIDE (GLUCOTROL XL) 5 MG 24 hr tablet Take 1 tablet (5 mg total) by mouth daily with breakfast.  . glucose blood (ONETOUCH VERIO) test strip Use as instructed  . insulin aspart (NOVOLOG FLEXPEN) 100 UNIT/ML FlexPen Inject 6-12 Units into the skin 3 (three) times daily with meals.  . Insulin Pen Needle (B-D ULTRAFINE III SHORT PEN) 31G X 8 MM MISC USE AS DIRECTED  . levonorgestrel (MIRENA, 52 MG,) 20 MCG/24HR IUD Mirena 20 mcg/24 hours (7 yrs) 52 mg intrauterine device  . lisinopril (PRINIVIL,ZESTRIL) 10 MG tablet Take 10 mg by mouth daily.  . vitamin B-12 (CYANOCOBALAMIN) 100 MCG tablet Take 100 mcg by mouth daily.  . vitamin C (ASCORBIC ACID) 500 MG tablet Take 500 mg by mouth daily.  . Vitamin D, Ergocalciferol, (DRISDOL) 1.25 MG (50000 UNIT) CAPS capsule Take 50,000 Units by mouth once a week.  . zinc gluconate 50 MG tablet Take 50 mg by mouth daily.  . [DISCONTINUED] insulin detemir (LEVEMIR FLEXTOUCH) 100 UNIT/ML FlexPen Inject 60 Units into the skin at bedtime.  . [DISCONTINUED] JANUMET 50-1000 MG tablet TAKE 1 TABLET BY MOUTH TWICE A DAY  . insulin detemir (LEVEMIR FLEXTOUCH) 100  UNIT/ML FlexPen Inject 60 Units into the skin at bedtime.  . sitaGLIPtin-metformin (JANUMET) 50-1000 MG tablet Take 1 tablet by mouth 2 (two) times daily.   No facility-administered encounter medications on file as of 11/12/2020.    ALLERGIES: No Known Allergies  VACCINATION STATUS: Immunization History  Administered Date(s) Administered  . Tdap 01/22/2015    Diabetes She presents for her follow-up diabetic visit. She has type 2 diabetes mellitus. Onset time: She was diagnosed at approximate age of 22 years. Her disease course has been worsening. There are no hypoglycemic associated symptoms. Pertinent negatives for hypoglycemia include no confusion, headaches, pallor or seizures. Associated  symptoms include fatigue, polydipsia, polyuria and weight loss. Pertinent negatives for diabetes include no chest pain and no polyphagia. There are no hypoglycemic complications. Symptoms are stable. There are no diabetic complications. Risk factors for coronary artery disease include dyslipidemia, diabetes mellitus, family history, sedentary lifestyle, obesity and hypertension. Current diabetic treatment includes oral agent (dual therapy) and intensive insulin program. She is compliant with treatment most of the time. Her weight is decreasing steadily. She is following a generally unhealthy diet. When asked about meal planning, she reported none. She has had a previous visit with a dietitian. She participates in exercise intermittently. Her home blood glucose trend is increasing steadily. Her breakfast blood glucose range is generally >200 mg/dl. Her lunch blood glucose range is generally >200 mg/dl. Her dinner blood glucose range is generally >200 mg/dl. Her bedtime blood glucose range is generally >200 mg/dl. Her overall blood glucose range is >200 mg/dl. (She presents today with her CGM, no logs, showing significantly worse glycemic profile overall.  She has not done well keeping her logs, nor with her diabetes management through the winter months.  Her POCT A1c today is 12%, increasing from last visit of 10.3%.  She reassures me that she is taking her medication as prescribed.  She denies any episodes of hypoglycemia.  Analysis of her CGM shows TIR 0%, TBR 0%, TAR (very high range) 100%.) An ACE inhibitor/angiotensin II receptor blocker is being taken. She does not see a podiatrist.Eye exam is current.  Hypertension This is a chronic problem. The current episode started more than 1 year ago. The problem has been gradually improving since onset. The problem is controlled. Pertinent negatives include no chest pain, headaches, palpitations or shortness of breath. There are no associated agents to hypertension.  Risk factors for coronary artery disease include dyslipidemia, diabetes mellitus and sedentary lifestyle. Past treatments include ACE inhibitors. The current treatment provides moderate improvement. There are no compliance problems.   Hyperlipidemia This is a chronic problem. The current episode started more than 1 year ago. The problem is controlled. Recent lipid tests were reviewed and are normal. Exacerbating diseases include diabetes and obesity. There are no known factors aggravating her hyperlipidemia. Pertinent negatives include no chest pain or shortness of breath. Current antihyperlipidemic treatment includes statins. The current treatment provides moderate improvement of lipids. There are no compliance problems.  Risk factors for coronary artery disease include diabetes mellitus, hypertension, dyslipidemia and obesity.     Review of systems  Constitutional: + Minimally fluctuating body weight,  current Body mass index is 27.42 kg/m. , no fatigue, no subjective hyperthermia, no subjective hypothermia Eyes: no blurry vision, no xerophthalmia ENT: no sore throat, no nodules palpated in throat, no dysphagia/odynophagia, no hoarseness Cardiovascular: no chest pain, no shortness of breath, no palpitations, no leg swelling Respiratory: no cough, no shortness of breath Gastrointestinal: no nausea/vomiting/diarrhea Musculoskeletal: no  muscle/joint aches Skin: no rashes, no hyperemia Neurological: no tremors, no numbness, no tingling, no dizziness Psychiatric: no depression, no anxiety    Objective:    BP 121/75 (BP Location: Left Arm, Patient Position: Sitting)   Pulse 88   Ht 5\' 5"  (1.651 m)   Wt 164 lb 12.8 oz (74.8 kg)   BMI 27.42 kg/m   Wt Readings from Last 3 Encounters:  11/12/20 164 lb 12.8 oz (74.8 kg)  08/08/20 179 lb (81.2 kg)  05/08/20 186 lb 6.4 oz (84.6 kg)    BP Readings from Last 3 Encounters:  11/12/20 121/75  08/08/20 131/65  05/08/20 96/68     Physical  Exam- Limited  Constitutional:  Body mass index is 27.42 kg/m. , not in acute distress, normal state of mind Eyes:  EOMI, no exophthalmos Neck: Supple Thyroid: No gross goiter Cardiovascular: RRR, no murmers, rubs, or gallops, no edema Respiratory: Adequate breathing efforts, no crackles, rales, rhonchi, or wheezing Musculoskeletal: no gross deformities, strength intact in all four extremities, no gross restriction of joint movements Skin:  no rashes, no hyperemia Neurological: no tremor with outstretched hands     CMP ( most recent) CMP     Component Value Date/Time   NA 138 08/01/2020 1024   K 4.9 08/01/2020 1024   CL 98 08/01/2020 1024   CO2 21 08/01/2020 1024   GLUCOSE 157 (H) 08/01/2020 1024   GLUCOSE 79 01/14/2015 1720   BUN 9 08/01/2020 1024   CREATININE 0.73 08/01/2020 1024   CALCIUM 9.8 08/01/2020 1024   PROT 7.2 08/01/2020 1024   ALBUMIN 4.5 08/01/2020 1024   AST 17 08/01/2020 1024   ALT 17 08/01/2020 1024   ALKPHOS 37 (L) 08/01/2020 1024   BILITOT 0.7 08/01/2020 1024   GFRNONAA 98 08/01/2020 1024   GFRAA 113 08/01/2020 1024    Diabetic Labs (most recent): Lab Results  Component Value Date   HGBA1C 12.0 (A) 11/12/2020   HGBA1C 10.3 (H) 08/01/2020   HGBA1C 8.3 (A) 05/08/2020     Lipid Panel ( most recent) Lipid Panel     Component Value Date/Time   CHOL 126 08/01/2020 1024   TRIG 119 08/01/2020 1024   HDL 52 08/01/2020 1024   CHOLHDL 2.4 08/01/2020 1024   LDLCALC 53 08/01/2020 1024     Assessment & Plan:   1) Uncontrolled type 2 diabetes mellitus with hyperglycemia (HCC)  - Patient has currently uncontrolled symptomatic type 2 DM since 50 years of age.  She presents today with her CGM, no logs, showing significantly worse glycemic profile overall.  She has not done well keeping her logs, nor with her diabetes management through the winter months.  Her POCT A1c today is 12%, increasing from last visit of 10.3%.  She reassures me that she is  taking her medication as prescribed.  She denies any episodes of hypoglycemia.  Analysis of her CGM shows TIR 0%, TBR 0%, TAR (very high range) 100%.   -Recent labs reviewed. POCT UM today shows mild microalbuminuria at 30.  Will monitor kidney function through CMP prior to next visit.  - She does not report any gross complications  from her diabetes, however,  [name] remains at a high risk for more acute and chronic complications which include CAD, CVA, CKD, retinopathy, and neuropathy. These are all discussed in detail with the patient.  - Nutritional counseling repeated at each appointment due to patients tendency to fall back in to old habits.  - The patient admits  there is a room for improvement in their diet and drink choices. -  Suggestion is made for the patient to avoid simple carbohydrates from their diet including Cakes, Sweet Desserts / Pastries, Ice Cream, Soda (diet and regular), Sweet Tea, Candies, Chips, Cookies, Sweet Pastries,  Store Bought Juices, Alcohol in Excess of  1-2 drinks a day, Artificial Sweeteners, Coffee Creamer, and "Sugar-free" Products. This will help patient to have stable blood glucose profile and potentially avoid unintended weight gain.   - I encouraged the patient to switch to  unprocessed or minimally processed complex starch and increased protein intake (animal or plant source), fruits, and vegetables.   - Patient is advised to stick to a routine mealtimes to eat 3 meals  a day and avoid unnecessary snacks ( to snack only to correct hypoglycemia).  - I have approached her with the following individualized plan to manage diabetes and patient agrees:   -Given her recent loss of control, I discussed and initiated low dose Glipizide 5 mg XL po daily with breakfast (she has done well with this medication before and was taken off of it as she was able to achieve good control of her diabetes previously).  She is advised to continue Levemir to 60 units SQ  nightly, increase Novolog slightly to 8-14 units TID with meals if glucose is above 90 and she is eating.  Specific instructions on how to titrate insulin dose based on glucose readings given to patient in writing.  She is also advised to continue Janumet 50/1000 mg po twice daily.  -She is encouraged to continue using her CGM to monitor blood glucose 4 times daily, before meals and before bed, and to call the clinic if she has readings less than 70 or greater than 300 for 3 tests in a row.  - Patient specific target  A1c;  LDL, HDL, Triglycerides,  were discussed in detail.  2) BP/HTN:  Her blood pressure is controlled to target.  She is advised to continue current medications including Lisinopril 10 mg p.o. daily.    3) Lipids/HPL:  Her most recent lipid panel from 08/01/20 shows controlled LDL at 53.  She is advised to continue Lipitor 10 mg po daily at bedtime.  Side effects and precautions discussed with her.  4)  Weight/Diet:  Her Body mass index is 27.42 kg/m. - she is a candidate for modest weight loss.  CDE Consult has been  re-initiated , exercise, and detailed carbohydrates information provided.  5) Vitamin D deficiency:  Her last vitamin D level on 12/13/19 was 15.  She has completed replenishment with Ergocalciferol.  She is taking Vitamin D3 2000 units daily as maintenance dose.  Will recheck vitamin d level prior to next visit.   6) Chronic Care/Health Maintenance: -she is on ACEI and Statin medications and is encouraged to continue to follow up with Ophthalmology, Dentist,  Podiatrist at least yearly or according to recommendations, and advised to stay away from smoking. I have recommended yearly flu vaccine and pneumonia vaccination at least every 5 years; moderate intensity exercise for up to 150 minutes weekly; and  sleep for at least 7 hours a day.   - I advised patient to maintain close follow up with Craig StaggersVasireddy, Sabitha, MD for primary care needs.  - Time spent on this  patient care encounter:  40 min, of which > 50% was spent in  counseling and the rest reviewing her blood glucose logs , discussing her hypoglycemia and hyperglycemia episodes, reviewing her  current and  previous labs / studies  ( including abstraction from other facilities) and medications  doses and developing a  long term treatment plan and documenting her care.   Please refer to Patient Instructions for Blood Glucose Monitoring and Insulin/Medications Dosing Guide"  in media tab for additional information. Please  also refer to " Patient Self Inventory" in the Media  tab for reviewed elements of pertinent patient history.  [name] participated in the discussions, expressed understanding, and voiced agreement with the above plans.  All questions were answered to her satisfaction. she is encouraged to contact clinic should she have any questions or concerns prior to her return visit.    Follow up plan: - Return in about 3 months (around 02/09/2021) for Diabetes follow up with A1c in office, Previsit labs, Bring glucometer and logs.  Ronny Bacon, [facility] [address] [place], [place] [address] Phone: [phone] Fax: [phone]  11/12/2020, 1:38 PM

## 2020-11-12 NOTE — Patient Instructions (Signed)

## 2021-01-14 ENCOUNTER — Other Ambulatory Visit: Payer: Self-pay | Admitting: "Endocrinology

## 2021-02-11 ENCOUNTER — Ambulatory Visit: Payer: BC Managed Care – PPO | Admitting: Nurse Practitioner

## 2021-12-03 ENCOUNTER — Other Ambulatory Visit: Payer: Self-pay | Admitting: Nurse Practitioner

## 2021-12-03 DIAGNOSIS — E559 Vitamin D deficiency, unspecified: Secondary | ICD-10-CM

## 2021-12-03 DIAGNOSIS — E1165 Type 2 diabetes mellitus with hyperglycemia: Secondary | ICD-10-CM

## 2021-12-05 LAB — COMPREHENSIVE METABOLIC PANEL
ALT: 14 IU/L (ref 0–32)
AST: 15 IU/L (ref 0–40)
Albumin/Globulin Ratio: 2 (ref 1.2–2.2)
Albumin: 4.7 g/dL (ref 3.8–4.8)
Alkaline Phosphatase: 41 IU/L — ABNORMAL LOW (ref 44–121)
BUN/Creatinine Ratio: 9 (ref 9–23)
BUN: 7 mg/dL (ref 6–24)
Bilirubin Total: 1.2 mg/dL (ref 0.0–1.2)
CO2: 24 mmol/L (ref 20–29)
Calcium: 9.2 mg/dL (ref 8.7–10.2)
Chloride: 97 mmol/L (ref 96–106)
Creatinine, Ser: 0.74 mg/dL (ref 0.57–1.00)
Globulin, Total: 2.3 g/dL (ref 1.5–4.5)
Glucose: 416 mg/dL — ABNORMAL HIGH (ref 70–99)
Potassium: 5.3 mmol/L — ABNORMAL HIGH (ref 3.5–5.2)
Sodium: 131 mmol/L — ABNORMAL LOW (ref 134–144)
Total Protein: 7 g/dL (ref 6.0–8.5)
eGFR: 100 mL/min/{1.73_m2} (ref >59)

## 2021-12-05 LAB — VITAMIN D 25 HYDROXY (VIT D DEFICIENCY, FRACTURES): Vit D, 25-Hydroxy: 27.3 ng/mL — ABNORMAL LOW (ref 30.0–100.0)

## 2021-12-09 ENCOUNTER — Other Ambulatory Visit: Payer: Self-pay

## 2021-12-09 ENCOUNTER — Other Ambulatory Visit: Payer: Self-pay | Admitting: Nurse Practitioner

## 2021-12-09 ENCOUNTER — Encounter: Payer: Self-pay | Admitting: Nurse Practitioner

## 2021-12-09 ENCOUNTER — Ambulatory Visit: Payer: BC Managed Care – PPO | Admitting: Nurse Practitioner

## 2021-12-09 VITALS — BP 110/75 | HR 94 | Ht 64.75 in | Wt 146.6 lb

## 2021-12-09 DIAGNOSIS — E559 Vitamin D deficiency, unspecified: Secondary | ICD-10-CM | POA: Diagnosis not present

## 2021-12-09 DIAGNOSIS — I10 Essential (primary) hypertension: Secondary | ICD-10-CM | POA: Diagnosis not present

## 2021-12-09 DIAGNOSIS — E782 Mixed hyperlipidemia: Secondary | ICD-10-CM

## 2021-12-09 DIAGNOSIS — E1165 Type 2 diabetes mellitus with hyperglycemia: Secondary | ICD-10-CM

## 2021-12-09 LAB — POCT GLYCOSYLATED HEMOGLOBIN (HGB A1C): HbA1c, POC (controlled diabetic range): 15 % — AB (ref 0.0–7.0)

## 2021-12-09 MED ORDER — LEVEMIR FLEXTOUCH 100 UNIT/ML ~~LOC~~ SOPN: 20.0000 [IU] | PEN_INJECTOR | Freq: Every day | SUBCUTANEOUS | 3 refills | Status: DC

## 2021-12-09 MED ORDER — JANUMET 50-1000 MG PO TABS: 1.0000 | ORAL_TABLET | Freq: Two times a day (BID) | ORAL | 3 refills | Status: DC

## 2021-12-09 MED ORDER — FREESTYLE LIBRE 14 DAY SENSOR MISC: 2 refills | Status: DC

## 2021-12-09 MED ORDER — GLIPIZIDE ER 5 MG PO TB24: 5.0000 mg | ORAL_TABLET | Freq: Every day | ORAL | 3 refills | Status: DC

## 2021-12-09 NOTE — Patient Instructions (Signed)

## 2021-12-09 NOTE — Progress Notes (Signed)
? ?12/09/2021 ? ?         ?Endocrinology follow-up note ? ?Subjective:  ? ? Patient ID: [name], female    DOB: 10/28/1970.  ?she is being seen in follow-up for management of currently uncontrolled symptomatic diabetes requested by  Craig Staggers, MD. ? ? ?Past Medical History:  ?Diagnosis Date  ? Diabetes mellitus without complication (HCC)   ? Infection   ? bladder infection  ? Ovarian cyst   ? Vaginal Pap smear, abnormal   ? laser removal of abn cells; normal since  ? Varicose veins during pregnancy, antepartum   ? ?Past Surgical History:  ?Procedure Laterality Date  ? CERVICAL CERCLAGE N/A 10/12/2014  ? Procedure: CERCLAGE CERVICAL;  Surgeon: Turner Daniels, MD;  Location: [facility];  Service: Gynecology;  Laterality: N/A;  ? OOPHORECTOMY    ? OVARIAN CYST REMOVAL    ? ?Social History  ? ?Socioeconomic History  ? Marital status: Married  ?  Spouse name: Not on file  ? Number of children: Not on file  ? Years of education: Not on file  ? Highest education level: Not on file  ?Occupational History  ? Not on file  ?Tobacco Use  ? Smoking status: Never  ? Smokeless tobacco: Never  ?Vaping Use  ? Vaping Use: Never used  ?Substance and Sexual Activity  ? Alcohol use: Yes  ?  Comment: rare, none with pregnancy  ? Drug use: No  ? Sexual activity: Not Currently  ?  Birth control/protection: Pill  ?  Comment: on pill when found out pregnant  ?Other Topics Concern  ? Not on file  ?Social History Narrative  ? Not on file  ? ?Social Determinants of Health  ? ?Financial Resource Strain: Not on file  ?Food Insecurity: Not on file  ?Transportation Needs: Not on file  ?Physical Activity: Not on file  ?Stress: Not on file  ?Social Connections: Not on file  ? ?Outpatient Encounter Medications as of 12/09/2021  ?Medication Sig  ? atorvastatin (LIPITOR) 10 MG tablet daily.  ? Cholecalciferol (VITAMIN D) 50 MCG (2000 UT) tablet Take 2,000 Units by mouth daily.  ? glucose blood (ONETOUCH VERIO) test strip Use as instructed  ?  Insulin Pen Needle (B-D ULTRAFINE III SHORT PEN) 31G X 8 MM MISC USE AS DIRECTED  ? levonorgestrel (MIRENA, 52 MG,) 20 MCG/24HR IUD Mirena 20 mcg/24 hours (7 yrs) 52 mg intrauterine device  ? lisinopril (PRINIVIL,ZESTRIL) 10 MG tablet Take 10 mg by mouth daily.  ? vitamin B-12 (CYANOCOBALAMIN) 100 MCG tablet Take 100 mcg by mouth daily.  ? vitamin C (ASCORBIC ACID) 500 MG tablet Take 500 mg by mouth daily.  ? Vitamin D, Ergocalciferol, (DRISDOL) 1.25 MG (50000 UNIT) CAPS capsule Take 50,000 Units by mouth once a week.  ? zinc gluconate 50 MG tablet Take 50 mg by mouth daily.  ? [DISCONTINUED] Continuous Blood Gluc Sensor (FREESTYLE LIBRE 14 DAY SENSOR) MISC PLACE 1 SENSOR INTO THE SKIN EVERY 14 (FOURTEEN) DAYS. USE AS DIRECTED.  ? [DISCONTINUED] glipiZIDE (GLUCOTROL XL) 5 MG 24 hr tablet Take 1 tablet (5 mg total) by mouth daily with breakfast.  ? [DISCONTINUED] insulin aspart (NOVOLOG FLEXPEN) 100 UNIT/ML FlexPen Inject 6-12 Units into the skin 3 (three) times daily with meals.  ? [DISCONTINUED] insulin detemir (LEVEMIR FLEXTOUCH) 100 UNIT/ML FlexPen Inject 60 Units into the skin at bedtime.  ? [DISCONTINUED] sitaGLIPtin-metformin (JANUMET) 50-1000 MG tablet Take 1 tablet by mouth 2 (two) times daily.  ? Continuous Blood  Gluc Sensor (FREESTYLE LIBRE 14 DAY SENSOR) MISC Change sensor every 14 days.  ? glipiZIDE (GLUCOTROL XL) 5 MG 24 hr tablet Take 1 tablet (5 mg total) by mouth daily with breakfast.  ? insulin detemir (LEVEMIR FLEXTOUCH) 100 UNIT/ML FlexPen Inject 20 Units into the skin at bedtime.  ? sitaGLIPtin-metformin (JANUMET) 50-1000 MG tablet Take 1 tablet by mouth 2 (two) times daily.  ? ?No facility-administered encounter medications on file as of 12/09/2021.  ? ? ?ALLERGIES: ?No Known Allergies ? ?VACCINATION STATUS: ?Immunization History  ?Administered Date(s) Administered  ? Tdap 01/22/2015  ? ? ?Diabetes ?She presents for her follow-up diabetic visit. She has type 2 diabetes mellitus. Onset time: She  was diagnosed at approximate age of 81 years. Her disease course has been worsening. There are no hypoglycemic associated symptoms. Pertinent negatives for hypoglycemia include no confusion, headaches, pallor or seizures. Associated symptoms include fatigue, polydipsia, polyuria and weight loss. Pertinent negatives for diabetes include no chest pain and no polyphagia. There are no hypoglycemic complications. Symptoms are stable. There are no diabetic complications. Risk factors for coronary artery disease include dyslipidemia, diabetes mellitus, family history, sedentary lifestyle, obesity and hypertension. Current diabetic treatment includes oral agent (dual therapy). She is compliant with treatment none of the time (Has not been taking her insulin in over 6 months). Her weight is decreasing rapidly. She is following a generally unhealthy diet. When asked about meal planning, she reported none. She has had a previous visit with a dietitian. She participates in exercise intermittently. (She presents today with no meter or logs to review.  She ran out of her CGM sensors and could not afford to pick them up previously.  Her POCT A1c today is >15%, worsening from previous visit of 12%.  She has not been taking her insulin in over 6 months but says she has been taking her Janumet and Glipizide as prescribed.  She has had a lot of family stressors recently contributing to her loss of control.) An ACE inhibitor/angiotensin II receptor blocker is being taken. She does not see a podiatrist.Eye exam is current.  ?Hypertension ?This is a chronic problem. The current episode started more than 1 year ago. The problem has been resolved since onset. The problem is controlled. Pertinent negatives include no chest pain, headaches, palpitations or shortness of breath. There are no associated agents to hypertension. Risk factors for coronary artery disease include dyslipidemia, diabetes mellitus and sedentary lifestyle. Past  treatments include ACE inhibitors. The current treatment provides moderate improvement. There are no compliance problems.   ?Hyperlipidemia ?This is a chronic problem. The current episode started more than 1 year ago. The problem is controlled. Recent lipid tests were reviewed and are normal. Exacerbating diseases include diabetes and obesity. There are no known factors aggravating her hyperlipidemia. Pertinent negatives include no chest pain or shortness of breath. Current antihyperlipidemic treatment includes statins. The current treatment provides moderate improvement of lipids. There are no compliance problems.  Risk factors for coronary artery disease include diabetes mellitus, hypertension, dyslipidemia and obesity.  ? ?Review of systems ? ?Constitutional: + rapidly decreasing body weight,  current Body mass index is 24.58 kg/m?. , no fatigue, no subjective hyperthermia, no subjective hypothermia ?Eyes: no blurry vision, no xerophthalmia ?ENT: no sore throat, no nodules palpated in throat, no dysphagia/odynophagia, no hoarseness ?Cardiovascular: no chest pain, no shortness of breath, no palpitations, no leg swelling ?Respiratory: no cough, no shortness of breath ?Gastrointestinal: no nausea/vomiting/diarrhea ?Musculoskeletal: no muscle/joint aches ?Skin: no rashes, no hyperemia ?  Neurological: no tremors, no numbness, no tingling, no dizziness ?Psychiatric: no depression, no anxiety, + increased stress ? ? ? ?Objective:  ?  ?BP 110/75   Pulse 94   Ht 5' 4.75" (1.645 m)   Wt 146 lb 9.6 oz (66.5 kg)   SpO2 100%   BMI 24.58 kg/m?   ?Wt Readings from Last 3 Encounters:  ?12/09/21 146 lb 9.6 oz (66.5 kg)  ?11/12/20 164 lb 12.8 oz (74.8 kg)  ?08/08/20 179 lb (81.2 kg)  ?  ?BP Readings from Last 3 Encounters:  ?12/09/21 110/75  ?11/12/20 121/75  ?08/08/20 131/65  ? ? ? ?Physical Exam- Limited ? ?Constitutional:  Body mass index is 24.58 kg/m?. , not in acute distress, normal state of mind ?Eyes:  EOMI, no  exophthalmos ?Neck: Supple ?Cardiovascular: RRR, no murmurs, rubs, or gallops, no edema ?Respiratory: Adequate breathing efforts, no crackles, rales, rhonchi, or wheezing ?Musculoskeletal: no gross deformities, strength intac

## 2021-12-10 ENCOUNTER — Other Ambulatory Visit: Payer: Self-pay

## 2021-12-10 DIAGNOSIS — E1165 Type 2 diabetes mellitus with hyperglycemia: Secondary | ICD-10-CM

## 2021-12-10 MED ORDER — INSULIN DEGLUDEC 100 UNIT/ML ~~LOC~~ SOPN: 20.0000 [IU] | PEN_INJECTOR | Freq: Every day | SUBCUTANEOUS | 1 refills | Status: DC

## 2021-12-10 NOTE — Telephone Encounter (Signed)
Any substitution will do, I gave her a sample pen of Tresiba from the office today

## 2021-12-23 ENCOUNTER — Ambulatory Visit: Payer: BC Managed Care – PPO | Admitting: Nurse Practitioner

## 2021-12-23 ENCOUNTER — Encounter: Payer: Self-pay | Admitting: Nurse Practitioner

## 2021-12-23 VITALS — BP 118/76 | HR 90 | Ht 64.75 in | Wt 149.6 lb

## 2021-12-23 DIAGNOSIS — E1165 Type 2 diabetes mellitus with hyperglycemia: Secondary | ICD-10-CM

## 2021-12-23 DIAGNOSIS — E559 Vitamin D deficiency, unspecified: Secondary | ICD-10-CM | POA: Diagnosis not present

## 2021-12-23 DIAGNOSIS — E782 Mixed hyperlipidemia: Secondary | ICD-10-CM

## 2021-12-23 DIAGNOSIS — I10 Essential (primary) hypertension: Secondary | ICD-10-CM

## 2021-12-23 LAB — POCT UA - MICROALBUMIN
Creatinine, POC: 50 mg/dL
Microalbumin Ur, POC: 10 mg/L

## 2021-12-23 NOTE — Patient Instructions (Signed)

## 2021-12-23 NOTE — Progress Notes (Signed)
? ?12/23/2021 ? ?         ?Endocrinology follow-up note ? ?Subjective:  ? ? Patient ID: [name], female    DOB: August 12, 1971.  ?she is being seen in follow-up for management of currently uncontrolled symptomatic diabetes requested by  Craig Staggers, MD. ? ? ?Past Medical History:  ?Diagnosis Date  ? Diabetes mellitus without complication (HCC)   ? Infection   ? bladder infection  ? Ovarian cyst   ? Vaginal Pap smear, abnormal   ? laser removal of abn cells; normal since  ? Varicose veins during pregnancy, antepartum   ? ?Past Surgical History:  ?Procedure Laterality Date  ? CERVICAL CERCLAGE N/A 10/12/2014  ? Procedure: CERCLAGE CERVICAL;  Surgeon: Turner Daniels, MD;  Location: [facility];  Service: Gynecology;  Laterality: N/A;  ? OOPHORECTOMY    ? OVARIAN CYST REMOVAL    ? ?Social History  ? ?Socioeconomic History  ? Marital status: Married  ?  Spouse name: Not on file  ? Number of children: Not on file  ? Years of education: Not on file  ? Highest education level: Not on file  ?Occupational History  ? Not on file  ?Tobacco Use  ? Smoking status: Never  ? Smokeless tobacco: Never  ?Vaping Use  ? Vaping Use: Never used  ?Substance and Sexual Activity  ? Alcohol use: Yes  ?  Comment: rare, none with pregnancy  ? Drug use: No  ? Sexual activity: Not Currently  ?  Birth control/protection: Pill  ?  Comment: on pill when found out pregnant  ?Other Topics Concern  ? Not on file  ?Social History Narrative  ? Not on file  ? ?Social Determinants of Health  ? ?Financial Resource Strain: Not on file  ?Food Insecurity: Not on file  ?Transportation Needs: Not on file  ?Physical Activity: Not on file  ?Stress: Not on file  ?Social Connections: Not on file  ? ?Outpatient Encounter Medications as of 12/23/2021  ?Medication Sig  ? atorvastatin (LIPITOR) 10 MG tablet daily.  ? Cholecalciferol (VITAMIN D) 50 MCG (2000 UT) tablet Take 2,000 Units by mouth daily.  ? Continuous Blood Gluc Sensor (FREESTYLE LIBRE 14 DAY SENSOR) MISC  Change sensor every 14 days.  ? glipiZIDE (GLUCOTROL XL) 5 MG 24 hr tablet Take 1 tablet (5 mg total) by mouth daily with breakfast.  ? glucose blood (ONETOUCH VERIO) test strip Use as instructed  ? Insulin Pen Needle (B-D ULTRAFINE III SHORT PEN) 31G X 8 MM MISC USE AS DIRECTED  ? LEVEMIR FLEXPEN 100 UNIT/ML FlexPen Inject 40 Units into the skin at bedtime.  ? levonorgestrel (MIRENA, 52 MG,) 20 MCG/24HR IUD Mirena 20 mcg/24 hours (7 yrs) 52 mg intrauterine device  ? lisinopril (PRINIVIL,ZESTRIL) 10 MG tablet Take 10 mg by mouth daily.  ? sitaGLIPtin-metformin (JANUMET) 50-1000 MG tablet Take 1 tablet by mouth 2 (two) times daily.  ? vitamin B-12 (CYANOCOBALAMIN) 100 MCG tablet Take 100 mcg by mouth daily.  ? vitamin C (ASCORBIC ACID) 500 MG tablet Take 500 mg by mouth daily.  ? Vitamin D, Ergocalciferol, (DRISDOL) 1.25 MG (50000 UNIT) CAPS capsule Take 50,000 Units by mouth once a week.  ? zinc gluconate 50 MG tablet Take 50 mg by mouth daily.  ? insulin degludec (TRESIBA) 100 UNIT/ML FlexTouch Pen Inject 20 Units into the skin at bedtime. (Patient not taking: Reported on 12/23/2021)  ? ?No facility-administered encounter medications on file as of 12/23/2021.  ? ? ?ALLERGIES: ?No Known  Allergies ? ?VACCINATION STATUS: ?Immunization History  ?Administered Date(s) Administered  ? Tdap 01/22/2015  ? ? ?Diabetes ?She presents for her follow-up diabetic visit. She has type 2 diabetes mellitus. Onset time: She was diagnosed at approximate age of 51 years. Her disease course has been stable. There are no hypoglycemic associated symptoms. Pertinent negatives for hypoglycemia include no confusion, headaches, pallor or seizures. Associated symptoms include fatigue, polydipsia and polyuria. Pertinent negatives for diabetes include no chest pain and no polyphagia. There are no hypoglycemic complications. Symptoms are stable. There are no diabetic complications. Risk factors for coronary artery disease include dyslipidemia,  diabetes mellitus, family history, sedentary lifestyle, obesity and hypertension. Current diabetic treatment includes insulin injections and oral agent (triple therapy). She is compliant with treatment most of the time. Her weight is stable. She is following a generally unhealthy diet. When asked about meal planning, she reported none. She has had a previous visit with a dietitian. She participates in exercise intermittently. Her home blood glucose trend is fluctuating minimally. Her overall blood glucose range is >200 mg/dl. (She presents today with her CGM and logs showing continued hyperglycemia overall.  She was not due for another A1c today.  Analysis of her CGM shows TIR 8%, TAR 92%, TBR 0%.  She continues to work on diet and exercise.) An ACE inhibitor/angiotensin II receptor blocker is being taken. She does not see a podiatrist.Eye exam is current.  ?Hypertension ?This is a chronic problem. The current episode started more than 1 year ago. The problem has been resolved since onset. The problem is controlled. Pertinent negatives include no chest pain, headaches, palpitations or shortness of breath. There are no associated agents to hypertension. Risk factors for coronary artery disease include dyslipidemia, diabetes mellitus and sedentary lifestyle. Past treatments include ACE inhibitors. The current treatment provides moderate improvement. There are no compliance problems.   ?Hyperlipidemia ?This is a chronic problem. The current episode started more than 1 year ago. The problem is controlled. Recent lipid tests were reviewed and are normal. Exacerbating diseases include diabetes and obesity. There are no known factors aggravating her hyperlipidemia. Pertinent negatives include no chest pain or shortness of breath. Current antihyperlipidemic treatment includes statins. The current treatment provides moderate improvement of lipids. There are no compliance problems.  Risk factors for coronary artery disease  include diabetes mellitus, hypertension, dyslipidemia and obesity.  ? ?Review of systems ? ?Constitutional: + stable body weight,  current Body mass index is 25.09 kg/m?. , no fatigue, no subjective hyperthermia, no subjective hypothermia ?Eyes: no blurry vision, no xerophthalmia ?ENT: no sore throat, no nodules palpated in throat, no dysphagia/odynophagia, no hoarseness ?Cardiovascular: no chest pain, no shortness of breath, no palpitations, no leg swelling ?Respiratory: no cough, no shortness of breath ?Gastrointestinal: no nausea/vomiting/diarrhea ?Musculoskeletal: no muscle/joint aches ?Skin: no rashes, no hyperemia ?Neurological: no tremors, no numbness, no tingling, no dizziness ?Psychiatric: no depression, no anxiety, + increased stress ? ? ? ?Objective:  ?  ?BP 118/76   Pulse 90   Ht 5' 4.75" (1.645 m)   Wt 149 lb 9.6 oz (67.9 kg)   SpO2 100%   BMI 25.09 kg/m?   ?Wt Readings from Last 3 Encounters:  ?12/23/21 149 lb 9.6 oz (67.9 kg)  ?12/09/21 146 lb 9.6 oz (66.5 kg)  ?11/12/20 164 lb 12.8 oz (74.8 kg)  ?  ?BP Readings from Last 3 Encounters:  ?12/23/21 118/76  ?12/09/21 110/75  ?11/12/20 121/75  ? ? ? ?Physical Exam- Limited ? ?Constitutional:  Body mass  index is 25.09 kg/m?. , not in acute distress, normal state of mind ?Eyes:  EOMI, no exophthalmos ?Neck: Supple ?Cardiovascular: RRR, no murmurs, rubs, or gallops, no edema ?Respiratory: Adequate breathing efforts, no crackles, rales, rhonchi, or wheezing ?Musculoskeletal: no gross deformities, strength intact in all four extremities, no gross restriction of joint movements ?Skin:  no rashes, no hyperemia ?Neurological: no tremor with outstretched hands ? ? ?Diabetic Foot Exam - Simple   ?Simple Foot Form ?Diabetic Foot exam was performed with the following findings: Yes 12/23/2021 12:03 PM  ?Visual Inspection ?See comments: Yes ?Sensation Testing ?Intact to touch and monofilament testing bilaterally: Yes ?Pulse Check ?Posterior Tibialis and Dorsalis  pulse intact bilaterally: Yes ?Comments ?Calluses noted to plantar foot bilaterally. ?  ? ? ?CMP ( most recent) ?CMP  ?   ?Component Value Date/Time  ? NA 131 (L) 12/04/2021 0958  ? K 5.3 (H) 12/04/2021 3846  ? CL 97 03/

## 2022-02-23 ENCOUNTER — Other Ambulatory Visit: Payer: Self-pay | Admitting: Nurse Practitioner

## 2022-03-25 ENCOUNTER — Ambulatory Visit: Payer: BC Managed Care – PPO | Admitting: Nurse Practitioner

## 2022-03-25 ENCOUNTER — Encounter: Payer: Self-pay | Admitting: Nurse Practitioner

## 2022-03-25 VITALS — BP 113/78 | HR 86 | Ht 64.75 in | Wt 155.0 lb

## 2022-03-25 DIAGNOSIS — E1165 Type 2 diabetes mellitus with hyperglycemia: Secondary | ICD-10-CM | POA: Diagnosis not present

## 2022-03-25 LAB — POCT GLYCOSYLATED HEMOGLOBIN (HGB A1C): HbA1c POC (<> result, manual entry): 11.8 % (ref 4.0–5.6)

## 2022-03-25 NOTE — Progress Notes (Signed)
03/25/2022           Endocrinology follow-up note  Subjective:    Patient ID: [name], female    DOB: 1971-04-03.  she is being seen in follow-up for management of currently uncontrolled symptomatic diabetes requested by  Craig Staggers, MD.   Past Medical History:  Diagnosis Date   Diabetes mellitus without complication (HCC)    Infection    bladder infection   Ovarian cyst    Vaginal Pap smear, abnormal    laser removal of abn cells; normal since   Varicose veins during pregnancy, antepartum    Past Surgical History:  Procedure Laterality Date   CERVICAL CERCLAGE N/A 10/12/2014   Procedure: CERCLAGE CERVICAL;  Surgeon: Turner Daniels, MD;  Location: [facility];  Service: Gynecology;  Laterality: N/A;   OOPHORECTOMY     OVARIAN CYST REMOVAL     Social History   Socioeconomic History   Marital status: Married    Spouse name: Not on file   Number of children: Not on file   Years of education: Not on file   Highest education level: Not on file  Occupational History   Not on file  Tobacco Use   Smoking status: Never   Smokeless tobacco: Never  Vaping Use   Vaping Use: Never used  Substance and Sexual Activity   Alcohol use: Yes    Comment: rare, none with pregnancy   Drug use: No   Sexual activity: Not Currently    Birth control/protection: Pill    Comment: on pill when found out pregnant  Other Topics Concern   Not on file  Social History Narrative   Not on file   Social Determinants of Health   Financial Resource Strain: Not on file  Food Insecurity: Not on file  Transportation Needs: Not on file  Physical Activity: Not on file  Stress: Not on file  Social Connections: Not on file   Outpatient Encounter Medications as of 03/25/2022  Medication Sig   atorvastatin (LIPITOR) 10 MG tablet daily.   B-D ULTRAFINE III SHORT PEN 31G X 8 MM MISC USE AS DIRECTED   Cholecalciferol (VITAMIN D) 50 MCG (2000 UT) tablet Take 2,000 Units by mouth daily.    Continuous Blood Gluc Sensor (FREESTYLE LIBRE 14 DAY SENSOR) MISC Change sensor every 14 days.   glipiZIDE (GLUCOTROL XL) 5 MG 24 hr tablet Take 1 tablet (5 mg total) by mouth daily with breakfast.   glucose blood (ONETOUCH VERIO) test strip Use as instructed   LEVEMIR FLEXPEN 100 UNIT/ML FlexPen Inject 40 Units into the skin at bedtime.   levonorgestrel (MIRENA, 52 MG,) 20 MCG/24HR IUD Mirena 20 mcg/24 hours (7 yrs) 52 mg intrauterine device   lisinopril (PRINIVIL,ZESTRIL) 10 MG tablet Take 10 mg by mouth daily.   sitaGLIPtin-metformin (JANUMET) 50-1000 MG tablet Take 1 tablet by mouth 2 (two) times daily.   vitamin B-12 (CYANOCOBALAMIN) 100 MCG tablet Take 100 mcg by mouth daily.   vitamin C (ASCORBIC ACID) 500 MG tablet Take 500 mg by mouth daily.   Vitamin D, Ergocalciferol, (DRISDOL) 1.25 MG (50000 UNIT) CAPS capsule Take 50,000 Units by mouth once a week.   zinc gluconate 50 MG tablet Take 50 mg by mouth daily.   [DISCONTINUED] insulin degludec (TRESIBA) 100 UNIT/ML FlexTouch Pen Inject 20 Units into the skin at bedtime. (Patient not taking: Reported on 12/23/2021)   No facility-administered encounter medications on file as of 03/25/2022.    ALLERGIES: No Known Allergies  VACCINATION STATUS: Immunization History  Administered Date(s) Administered   Tdap 01/22/2015    Diabetes She presents for her follow-up diabetic visit. She has type 2 diabetes mellitus. Onset time: She was diagnosed at approximate age of 51 years. Her disease course has been improving. There are no hypoglycemic associated symptoms. Pertinent negatives for hypoglycemia include no confusion, headaches, pallor or seizures. Associated symptoms include fatigue, polydipsia and polyuria. Pertinent negatives for diabetes include no chest pain and no polyphagia. There are no hypoglycemic complications. Symptoms are improving. There are no diabetic complications. Risk factors for coronary artery disease include dyslipidemia,  diabetes mellitus, family history, sedentary lifestyle, obesity and hypertension. Current diabetic treatment includes insulin injections and oral agent (triple therapy). She is compliant with treatment most of the time. Her weight is stable. She is following a generally unhealthy diet. When asked about meal planning, she reported none. She has had a previous visit with a dietitian. She participates in exercise intermittently. Her overall blood glucose range is >200 mg/dl. (She presents today with her CGM, no logs, showing inconsistent glucose monitoring.  She has only been scanning once per day.  Her POCT A1c today is 11.8%, improving from last visit of 15%.  She notes she still has trouble with eating healthy but is trying.  Analysis of her CGM shows TIR 20%, TAR 80%, TBR 0%.  She denies any hypoglycemia.) An ACE inhibitor/angiotensin II receptor blocker is being taken. She does not see a podiatrist.Eye exam is current.  Hypertension This is a chronic problem. The current episode started more than 1 year ago. The problem has been resolved since onset. The problem is controlled. Pertinent negatives include no chest pain, headaches, palpitations or shortness of breath. There are no associated agents to hypertension. Risk factors for coronary artery disease include dyslipidemia, diabetes mellitus and sedentary lifestyle. Past treatments include ACE inhibitors. The current treatment provides moderate improvement. There are no compliance problems.   Hyperlipidemia This is a chronic problem. The current episode started more than 1 year ago. The problem is controlled. Recent lipid tests were reviewed and are normal. Exacerbating diseases include diabetes and obesity. There are no known factors aggravating her hyperlipidemia. Pertinent negatives include no chest pain or shortness of breath. Current antihyperlipidemic treatment includes statins. The current treatment provides moderate improvement of lipids. There are no  compliance problems.  Risk factors for coronary artery disease include diabetes mellitus, hypertension, dyslipidemia and obesity.    Review of systems  Constitutional: + steadily increasing body weight,  current Body mass index is 25.99 kg/m. , no fatigue, no subjective hyperthermia, no subjective hypothermia Eyes: no blurry vision, no xerophthalmia ENT: no sore throat, no nodules palpated in throat, no dysphagia/odynophagia, no hoarseness Cardiovascular: no chest pain, no shortness of breath, no palpitations, no leg swelling Respiratory: no cough, no shortness of breath Gastrointestinal: no nausea/vomiting/diarrhea Musculoskeletal: no muscle/joint aches Skin: no rashes, no hyperemia Neurological: no tremors, no numbness, no tingling, no dizziness Psychiatric: no depression, no anxiety, + increased stress    Objective:    BP 113/78   Pulse 86   Ht 5' 4.75" (1.645 m)   Wt 155 lb (70.3 kg)   BMI 25.99 kg/m   Wt Readings from Last 3 Encounters:  03/25/22 155 lb (70.3 kg)  12/23/21 149 lb 9.6 oz (67.9 kg)  12/09/21 146 lb 9.6 oz (66.5 kg)    BP Readings from Last 3 Encounters:  03/25/22 113/78  12/23/21 118/76  12/09/21 110/75     Physical Exam-  Limited  Constitutional:  Body mass index is 25.99 kg/m. , not in acute distress, normal state of mind Eyes:  EOMI, no exophthalmos Neck: Supple Cardiovascular: RRR, no murmurs, rubs, or gallops, no edema Respiratory: Adequate breathing efforts, no crackles, rales, rhonchi, or wheezing Musculoskeletal: no gross deformities, strength intact in all four extremities, no gross restriction of joint movements Skin:  no rashes, no hyperemia Neurological: no tremor with outstretched hands   Diabetic Foot Exam - Simple   No data filed     CMP ( most recent) CMP     Component Value Date/Time   NA 131 (L) 12/04/2021 0958   K 5.3 (H) 12/04/2021 0958   CL 97 12/04/2021 0958   CO2 24 12/04/2021 0958   GLUCOSE 416 (H) 12/04/2021  0958   GLUCOSE 79 01/14/2015 1720   BUN 7 12/04/2021 0958   CREATININE 0.74 12/04/2021 0958   CALCIUM 9.2 12/04/2021 0958   PROT 7.0 12/04/2021 0958   ALBUMIN 4.7 12/04/2021 0958   AST 15 12/04/2021 0958   ALT 14 12/04/2021 0958   ALKPHOS 41 (L) 12/04/2021 0958   BILITOT 1.2 12/04/2021 0958   GFRNONAA 98 08/01/2020 1024   GFRAA 113 08/01/2020 1024    Diabetic Labs (most recent): Lab Results  Component Value Date   HGBA1C 11.8 03/25/2022   HGBA1C 15.0 (A) 12/09/2021   HGBA1C 12.0 (A) 11/12/2020   MICROALBUR 10 12/23/2021   MICROALBUR 30 11/12/2020     Lipid Panel ( most recent) Lipid Panel     Component Value Date/Time   CHOL 126 08/01/2020 1024   TRIG 119 08/01/2020 1024   HDL 52 08/01/2020 1024   CHOLHDL 2.4 08/01/2020 1024   LDLCALC 53 08/01/2020 1024     Assessment & Plan:   1) Uncontrolled type 2 diabetes mellitus with hyperglycemia ([place])  - Patient has currently uncontrolled symptomatic type 2 DM since 51 years of age.  She presents today with her CGM, no logs, showing inconsistent glucose monitoring.  She has only been scanning once per day.  Her POCT A1c today is 11.8%, improving from last visit of 15%.  She notes she still has trouble with eating healthy but is trying.  Analysis of her CGM shows TIR 20%, TAR 80%, TBR 0%.  She denies any hypoglycemia.   -Recent labs reviewed.   - She does not report any gross complications  from her diabetes, however,  Kristalee Lima remains at a high risk for more acute and chronic complications which include CAD, CVA, CKD, retinopathy, and neuropathy. These are all discussed in detail with the patient.  The following Lifestyle Medicine recommendations according to [facility]) were discussed and offered to patient and she agrees to start the journey:  A. Whole Foods, Plant-based plate comprising of fruits and vegetables, plant-based proteins, whole-grain carbohydrates was discussed in detail  with the patient.   A list for source of those nutrients were also provided to the patient.  Patient will use only water or unsweetened tea for hydration. B.  The need to stay away from risky substances including alcohol, smoking; obtaining 7 to 9 hours of restorative sleep, at least 150 minutes of moderate intensity exercise weekly, the importance of healthy social connections,  and stress reduction techniques were discussed. C.  A full color page of  Calorie density of various food groups per pound showing examples of each food groups was provided to the patient.  - Nutritional counseling repeated at each appointment due  to patients tendency to fall back in to old habits.  - The patient admits there is a room for improvement in their diet and drink choices. -  Suggestion is made for the patient to avoid simple carbohydrates from their diet including Cakes, Sweet Desserts / Pastries, Ice Cream, Soda (diet and regular), Sweet Tea, Candies, Chips, Cookies, Sweet Pastries, Store Bought Juices, Alcohol in Excess of 1-2 drinks a day, Artificial Sweeteners, Coffee Creamer, and "Sugar-free" Products. This will help patient to have stable blood glucose profile and potentially avoid unintended weight gain.   - I encouraged the patient to switch to unprocessed or minimally processed complex starch and increased protein intake (animal or plant source), fruits, and vegetables.   - Patient is advised to stick to a routine mealtimes to eat 3 meals a day and avoid unnecessary snacks (to snack only to correct hypoglycemia).  - I have approached her with the following individualized plan to manage diabetes and patient agrees:   -Given the lack of consistent monitoring, no changes will be made to her medications today.  She is advised to continue Levemir 40 units SQ nightly, Glipizide 5 mg XL daily with breakfast and Janumet 50/1000 mg po twice daily with meals.   -She is encouraged to continue using her CGM to  monitor blood glucose 4 times daily, before meals and before bed, and to call the clinic if she has readings less than 70 or greater than 300 for 3 tests in a row.    - Patient specific target  A1c;  LDL, HDL, Triglycerides,  were discussed in detail.  2) BP/HTN:  Her blood pressure is controlled to target.  She is advised to continue current medications including Lisinopril 10 mg p.o. daily.    3) Lipids/HPL:  Her most recent lipid panel from 08/01/20 shows controlled LDL at 53.  She is advised to continue Lipitor 10 mg po daily at bedtime.  Side effects and precautions discussed with her.  Asked her to have PCP send over recent labs for our records.  4)  Weight/Diet:  Her Body mass index is 25.99 kg/m. - she is a candidate for modest weight loss.  CDE Consult has been  re-initiated , exercise, and detailed carbohydrates information provided.  5) Vitamin D deficiency:  Her last vitamin D level on 12/04/21 was 27.3.  She has completed replenishment with Ergocalciferol.  She is taking Vitamin D3 2000 units daily as maintenance dose.    6) Chronic Care/Health Maintenance: -she is on ACEI and Statin medications and is encouraged to continue to follow up with Ophthalmology, Dentist,  Podiatrist at least yearly or according to recommendations, and advised to stay away from smoking. I have recommended yearly flu vaccine and pneumonia vaccination at least every 5 years; moderate intensity exercise for up to 150 minutes weekly; and  sleep for at least 7 hours a day.   - I advised patient to maintain close follow up with Craig Staggers, MD for primary care needs.      I spent 35 minutes in the care of the patient today including review of labs from CMP, Lipids, Thyroid Function, Hematology (current and previous including abstractions from other facilities); face-to-face time discussing  her blood glucose readings/logs, discussing hypoglycemia and hyperglycemia episodes and symptoms, medications  doses, her options of short and long term treatment based on the latest standards of care / guidelines;  discussion about incorporating lifestyle medicine;  and documenting the encounter. Risk reduction counseling performed per USPSTF guidelines  to reduce obesity and cardiovascular risk factors.     Please refer to Patient Instructions for Blood Glucose Monitoring and Insulin/Medications Dosing Guide"  in media tab for additional information. Please  also refer to " Patient Self Inventory" in the Media  tab for reviewed elements of pertinent patient history.  Hendricks Limes participated in the discussions, expressed understanding, and voiced agreement with the above plans.  All questions were answered to her satisfaction. she is encouraged to contact clinic should she have any questions or concerns prior to her return visit.    Follow up plan: - Return in about 3 months (around 06/25/2022) for Diabetes F/U with A1c in office, No previsit labs, Bring meter and logs.  Rayetta Pigg, [facility] [address] [place], [place] [address] Phone: [phone] Fax: [phone]  03/25/2022, 10:43 AM

## 2022-03-26 ENCOUNTER — Other Ambulatory Visit: Payer: Self-pay | Admitting: Nurse Practitioner

## 2022-06-25 ENCOUNTER — Ambulatory Visit (INDEPENDENT_AMBULATORY_CARE_PROVIDER_SITE_OTHER): Payer: BC Managed Care – PPO | Admitting: Nurse Practitioner

## 2022-06-25 ENCOUNTER — Encounter: Payer: Self-pay | Admitting: Nurse Practitioner

## 2022-06-25 VITALS — BP 125/83 | HR 101 | Ht 64.75 in | Wt 158.4 lb

## 2022-06-25 DIAGNOSIS — E782 Mixed hyperlipidemia: Secondary | ICD-10-CM

## 2022-06-25 DIAGNOSIS — E559 Vitamin D deficiency, unspecified: Secondary | ICD-10-CM

## 2022-06-25 DIAGNOSIS — I10 Essential (primary) hypertension: Secondary | ICD-10-CM

## 2022-06-25 DIAGNOSIS — E1165 Type 2 diabetes mellitus with hyperglycemia: Secondary | ICD-10-CM | POA: Diagnosis not present

## 2022-06-25 LAB — POCT GLYCOSYLATED HEMOGLOBIN (HGB A1C): Hemoglobin A1C: 11 % — AB (ref 4.0–5.6)

## 2022-06-25 NOTE — Progress Notes (Signed)
06/25/2022           Endocrinology follow-up note  Subjective:    Patient ID: [name], female    DOB: [birth year].  she is being seen in follow-up for management of currently uncontrolled symptomatic diabetes requested by  Dairl Ponder, MD.   Past Medical History:  Diagnosis Date   Diabetes mellitus without complication ([place])    Infection    bladder infection   Ovarian cyst    Vaginal Pap smear, abnormal    laser removal of abn cells; normal since   Varicose veins during pregnancy, antepartum    Past Surgical History:  Procedure Laterality Date   CERVICAL CERCLAGE N/A 10/12/2014   Procedure: CERCLAGE CERVICAL;  Surgeon: Luz Lex, MD;  Location: [facility];  Service: Gynecology;  Laterality: N/A;   OOPHORECTOMY     OVARIAN CYST REMOVAL     Social History   Socioeconomic History   Marital status: Married    Spouse name: Not on file   Number of children: Not on file   Years of education: Not on file   Highest education level: Not on file  Occupational History   Not on file  Tobacco Use   Smoking status: Never   Smokeless tobacco: Never  Vaping Use   Vaping Use: Never used  Substance and Sexual Activity   Alcohol use: Yes    Comment: rare, none with pregnancy   Drug use: No   Sexual activity: Not Currently    Birth control/protection: Pill    Comment: on pill when found out pregnant  Other Topics Concern   Not on file  Social History Narrative   Not on file   Social Determinants of Health   Financial Resource Strain: Not on file  Food Insecurity: Not on file  Transportation Needs: Not on file  Physical Activity: Not on file  Stress: Not on file  Social Connections: Not on file   Outpatient Encounter Medications as of 06/25/2022  Medication Sig   atorvastatin (LIPITOR) 10 MG tablet daily.   B-D ULTRAFINE III SHORT PEN 31G X 8 MM MISC USE AS DIRECTED   Cholecalciferol (VITAMIN D) 50 MCG (2000 UT) tablet Take 2,000 Units by mouth daily.    Continuous Blood Gluc Sensor (FREESTYLE LIBRE 14 DAY SENSOR) MISC CHANGE SENSOR EVERY 14 DAYS   glipiZIDE (GLUCOTROL XL) 5 MG 24 hr tablet Take 1 tablet (5 mg total) by mouth daily with breakfast.   glucose blood (ONETOUCH VERIO) test strip Use as instructed   LEVEMIR FLEXPEN 100 UNIT/ML FlexPen Inject 40 Units into the skin at bedtime.   levonorgestrel (MIRENA, 52 MG,) 20 MCG/24HR IUD Mirena 20 mcg/24 hours (7 yrs) 52 mg intrauterine device   lisinopril (PRINIVIL,ZESTRIL) 10 MG tablet Take 10 mg by mouth daily.   sitaGLIPtin-metformin (JANUMET) 50-1000 MG tablet Take 1 tablet by mouth 2 (two) times daily.   vitamin B-12 (CYANOCOBALAMIN) 100 MCG tablet Take 100 mcg by mouth daily.   vitamin C (ASCORBIC ACID) 500 MG tablet Take 500 mg by mouth daily.   Vitamin D, Ergocalciferol, (DRISDOL) 1.25 MG (50000 UNIT) CAPS capsule Take 50,000 Units by mouth once a week.   zinc gluconate 50 MG tablet Take 50 mg by mouth daily.   No facility-administered encounter medications on file as of 06/25/2022.    ALLERGIES: No Known Allergies  VACCINATION STATUS: Immunization History  Administered Date(s) Administered   Tdap 01/22/2015    Diabetes She presents for her follow-up diabetic visit. She  has type 2 diabetes mellitus. Onset time: She was diagnosed at approximate age of 77 years. Her disease course has been fluctuating. There are no hypoglycemic associated symptoms. Pertinent negatives for hypoglycemia include no confusion, headaches, pallor or seizures. Associated symptoms include fatigue, polydipsia and polyuria. Pertinent negatives for diabetes include no chest pain and no polyphagia. There are no hypoglycemic complications. Symptoms are improving. There are no diabetic complications. Risk factors for coronary artery disease include dyslipidemia, diabetes mellitus, family history, sedentary lifestyle, obesity and hypertension. Current diabetic treatment includes insulin injections and oral agent  (triple therapy). She is compliant with treatment most of the time. Her weight is increasing steadily. She is following a generally unhealthy diet. When asked about meal planning, she reported none. She has had a previous visit with a dietitian. She participates in exercise intermittently. Her home blood glucose trend is fluctuating minimally. Her overall blood glucose range is >200 mg/dl. (She presents today with her CGM showing above target glycemic profile overall.  Her POCT A1c today is 11%, improving slightly from last visit of 11.8%.  Analysis of her CGM shows TIR 19%, TAR 80%, TBR 1% with a GMI of 9.2%.  She did have 2 incidences where her glucose dropped into the 50s while she was sleeping for unknown reasons.  She does still consume processed carbs which we discussed needing to limit to prevent her needing prandial insulin in the future.) An ACE inhibitor/angiotensin II receptor blocker is being taken. She does not see a podiatrist.Eye exam is current.  Hypertension This is a chronic problem. The current episode started more than 1 year ago. The problem has been resolved since onset. The problem is controlled. Pertinent negatives include no chest pain, headaches, palpitations or shortness of breath. There are no associated agents to hypertension. Risk factors for coronary artery disease include dyslipidemia, diabetes mellitus and sedentary lifestyle. Past treatments include ACE inhibitors. The current treatment provides moderate improvement. There are no compliance problems.   Hyperlipidemia This is a chronic problem. The current episode started more than 1 year ago. The problem is controlled. Recent lipid tests were reviewed and are normal. Exacerbating diseases include diabetes and obesity. There are no known factors aggravating her hyperlipidemia. Pertinent negatives include no chest pain or shortness of breath. Current antihyperlipidemic treatment includes statins. The current treatment provides  moderate improvement of lipids. There are no compliance problems.  Risk factors for coronary artery disease include diabetes mellitus, hypertension, dyslipidemia and obesity.    Review of systems  Constitutional: + steadily increasing body weight,  current Body mass index is 26.56 kg/m. , no fatigue, no subjective hyperthermia, no subjective hypothermia Eyes: no blurry vision, no xerophthalmia ENT: no sore throat, no nodules palpated in throat, no dysphagia/odynophagia, no hoarseness Cardiovascular: no chest pain, no shortness of breath, no palpitations, no leg swelling Respiratory: no cough, no shortness of breath Gastrointestinal: no nausea/vomiting/diarrhea Musculoskeletal: no muscle/joint aches Skin: no rashes, no hyperemia Neurological: no tremors, no numbness, no tingling, no dizziness Psychiatric: no depression, no anxiety, + increased stress-recently lost mother    Objective:    BP 125/83 (BP Location: Left Arm, Patient Position: Sitting, Cuff Size: Normal)   Pulse (!) 101   Ht 5' 4.75" (1.645 m)   Wt 158 lb 6.4 oz (71.8 kg)   BMI 26.56 kg/m   Wt Readings from Last 3 Encounters:  06/25/22 158 lb 6.4 oz (71.8 kg)  03/25/22 155 lb (70.3 kg)  12/23/21 149 lb 9.6 oz (67.9 kg)  BP Readings from Last 3 Encounters:  06/25/22 125/83  03/25/22 113/78  12/23/21 118/76     Physical Exam- Limited  Constitutional:  Body mass index is 26.56 kg/m. , not in acute distress, normal state of mind Eyes:  EOMI, no exophthalmos Neck: Supple Cardiovascular: RRR, no murmurs, rubs, or gallops, no edema Respiratory: Adequate breathing efforts, no crackles, rales, rhonchi, or wheezing Musculoskeletal: no gross deformities, strength intact in all four extremities, no gross restriction of joint movements Skin:  no rashes, no hyperemia Neurological: no tremor with outstretched hands   Diabetic Foot Exam - Simple   No data filed     CMP ( most recent) CMP     Component Value  Date/Time   NA 131 (L) 12/04/2021 0958   K 5.3 (H) 12/04/2021 0958   CL 97 12/04/2021 0958   CO2 24 12/04/2021 0958   GLUCOSE 416 (H) 12/04/2021 0958   GLUCOSE 79 01/14/2015 1720   BUN 7 12/04/2021 0958   CREATININE 0.74 12/04/2021 0958   CALCIUM 9.2 12/04/2021 0958   PROT 7.0 12/04/2021 0958   ALBUMIN 4.7 12/04/2021 0958   AST 15 12/04/2021 0958   ALT 14 12/04/2021 0958   ALKPHOS 41 (L) 12/04/2021 0958   BILITOT 1.2 12/04/2021 0958   GFRNONAA 98 08/01/2020 1024   GFRAA 113 08/01/2020 1024    Diabetic Labs (most recent): Lab Results  Component Value Date   HGBA1C 11.0 (A) 06/25/2022   HGBA1C 11.8 03/25/2022   HGBA1C 15.0 (A) 12/09/2021   MICROALBUR 10 12/23/2021   MICROALBUR 30 11/12/2020     Lipid Panel ( most recent) Lipid Panel     Component Value Date/Time   CHOL 126 08/01/2020 1024   TRIG 119 08/01/2020 1024   HDL 52 08/01/2020 1024   CHOLHDL 2.4 08/01/2020 1024   LDLCALC 53 08/01/2020 1024     Assessment & Plan:   1) Uncontrolled type 2 diabetes mellitus with hyperglycemia ([place])  - Patient has currently uncontrolled symptomatic type 2 DM since 51 years of age.  She presents today with her CGM showing above target glycemic profile overall.  Her POCT A1c today is 11%, improving slightly from last visit of 11.8%.  Analysis of her CGM shows TIR 19%, TAR 80%, TBR 1% with a GMI of 9.2%.  She did have 2 incidences where her glucose dropped into the 50s while she was sleeping for unknown reasons.  She does still consume processed carbs which we discussed needing to limit to prevent her needing prandial insulin in the future.   -Recent labs reviewed.   - She does not report any gross complications  from her diabetes, however,  Yaritzel Morey remains at a high risk for more acute and chronic complications which include CAD, CVA, CKD, retinopathy, and neuropathy. These are all discussed in detail with the patient.  The following Lifestyle Medicine recommendations  according to [facility]) were discussed and offered to patient and she agrees to start the journey:  A. Whole Foods, Plant-based plate comprising of fruits and vegetables, plant-based proteins, whole-grain carbohydrates was discussed in detail with the patient.   A list for source of those nutrients were also provided to the patient.  Patient will use only water or unsweetened tea for hydration. B.  The need to stay away from risky substances including alcohol, smoking; obtaining 7 to 9 hours of restorative sleep, at least 150 minutes of moderate intensity exercise weekly, the importance of healthy social connections,  and stress reduction techniques were discussed. C.  A full color page of  Calorie density of various food groups per pound showing examples of each food groups was provided to the patient.  - Nutritional counseling repeated at each appointment due to patients tendency to fall back in to old habits.  - The patient admits there is a room for improvement in their diet and drink choices. -  Suggestion is made for the patient to avoid simple carbohydrates from their diet including Cakes, Sweet Desserts / Pastries, Ice Cream, Soda (diet and regular), Sweet Tea, Candies, Chips, Cookies, Sweet Pastries, Store Bought Juices, Alcohol in Excess of 1-2 drinks a day, Artificial Sweeteners, Coffee Creamer, and "Sugar-free" Products. This will help patient to have stable blood glucose profile and potentially avoid unintended weight gain.   - I encouraged the patient to switch to unprocessed or minimally processed complex starch and increased protein intake (animal or plant source), fruits, and vegetables.   - Patient is advised to stick to a routine mealtimes to eat 3 meals a day and avoid unnecessary snacks (to snack only to correct hypoglycemia).  - I have approached her with the following individualized plan to manage diabetes and patient agrees:   -She is advised  to continue Levemir 40 units SQ nightly, Glipizide 5 mg XL daily with breakfast and Janumet 50/1000 mg po twice daily with meals.  We discussed potentially needing prandial insulin in the future if we cannot prevent such high postprandial spikes.  -She is encouraged to continue using her CGM to monitor blood glucose 4 times daily, before meals and before bed, and to call the clinic if she has readings less than 70 or greater than 300 for 3 tests in a row.    - Patient specific target  A1c;  LDL, HDL, Triglycerides,  were discussed in detail.  2) BP/HTN:  Her blood pressure is controlled to target.  She is advised to continue current medications including Lisinopril 10 mg p.o. daily.    3) Lipids/HPL:  Her most recent lipid panel from 08/01/20 shows controlled LDL at 53.  She is advised to continue Lipitor 10 mg po daily at bedtime.  Side effects and precautions discussed with her.  Asked her to have PCP send over recent labs for our records.  4)  Weight/Diet:  Her Body mass index is 26.56 kg/m. - she is a candidate for modest weight loss.  CDE Consult has been  re-initiated , exercise, and detailed carbohydrates information provided.  5) Vitamin D deficiency:  Her last vitamin D level on 12/04/21 was 27.3.  She has completed replenishment with Ergocalciferol.  She is taking Vitamin D3 2000 units daily as maintenance dose.    6) Chronic Care/Health Maintenance: -she is on ACEI and Statin medications and is encouraged to continue to follow up with Ophthalmology, Dentist,  Podiatrist at least yearly or according to recommendations, and advised to stay away from smoking. I have recommended yearly flu vaccine and pneumonia vaccination at least every 5 years; moderate intensity exercise for up to 150 minutes weekly; and  sleep for at least 7 hours a day.   - I advised patient to maintain close follow up with Dairl Ponder, MD for primary care needs.      I spent 46 minutes in the care of  the patient today including review of labs from [place], Lipids, Thyroid Function, Hematology (current and previous including abstractions from other facilities); face-to-face time discussing  her blood glucose readings/logs, discussing  hypoglycemia and hyperglycemia episodes and symptoms, medications doses, her options of short and long term treatment based on the latest standards of care / guidelines;  discussion about incorporating lifestyle medicine;  and documenting the encounter. Risk reduction counseling performed per USPSTF guidelines to reduce obesity and cardiovascular risk factors.     Please refer to Patient Instructions for Blood Glucose Monitoring and Insulin/Medications Dosing Guide"  in media tab for additional information. Please  also refer to " Patient Self Inventory" in the Media  tab for reviewed elements of pertinent patient history.  Hendricks Limes participated in the discussions, expressed understanding, and voiced agreement with the above plans.  All questions were answered to her satisfaction. she is encouraged to contact clinic should she have any questions or concerns prior to her return visit.    Follow up plan: - Return in about 3 months (around 09/25/2022) for Diabetes F/U with A1c in office, No previsit labs, Bring meter and logs.  Rayetta Pigg, [facility] [address] [place], [place] [address] Phone: [phone] Fax: [phone]  06/25/2022, 9:42 AM

## 2022-07-03 LAB — COMPREHENSIVE METABOLIC PANEL
Albumin: 4.5 (ref 3.5–5.0)
Calcium: 9.4 (ref 8.7–10.7)
Globulin: 2.9

## 2022-07-03 LAB — LIPID PANEL
Cholesterol: 177 (ref 0–200)
HDL: 57 (ref 35–70)
LDL Cholesterol: 99
LDl/HDL Ratio: 3.1
Triglycerides: 111 (ref 40–160)

## 2022-07-03 LAB — BASIC METABOLIC PANEL
BUN: 11 (ref 4–21)
CO2: 24 — AB (ref 13–22)
Chloride: 103 (ref 99–108)
Creatinine: 0.6 (ref 0.5–1.1)
Glucose: 177
Potassium: 4.8 mEq/L (ref 3.5–5.1)
Sodium: 136 — AB (ref 137–147)

## 2022-07-03 LAB — HEPATIC FUNCTION PANEL
ALT: 9 U/L (ref 7–35)
AST: 11 — AB (ref 13–35)
Alkaline Phosphatase: 27 (ref 25–125)
Bilirubin, Total: 1.2

## 2022-07-08 ENCOUNTER — Encounter: Payer: Self-pay | Admitting: Nurse Practitioner

## 2022-07-20 ENCOUNTER — Other Ambulatory Visit: Payer: Self-pay | Admitting: Nurse Practitioner

## 2022-09-25 ENCOUNTER — Ambulatory Visit: Payer: BC Managed Care – PPO | Admitting: Nurse Practitioner

## 2022-10-21 ENCOUNTER — Ambulatory Visit: Payer: BC Managed Care – PPO | Admitting: Nurse Practitioner

## 2022-10-21 DIAGNOSIS — E782 Mixed hyperlipidemia: Secondary | ICD-10-CM

## 2022-10-21 DIAGNOSIS — I10 Essential (primary) hypertension: Secondary | ICD-10-CM

## 2022-10-21 DIAGNOSIS — E1165 Type 2 diabetes mellitus with hyperglycemia: Secondary | ICD-10-CM

## 2022-10-21 DIAGNOSIS — E559 Vitamin D deficiency, unspecified: Secondary | ICD-10-CM

## 2022-11-28 ENCOUNTER — Other Ambulatory Visit: Payer: Self-pay | Admitting: Nurse Practitioner

## 2022-11-28 DIAGNOSIS — E1165 Type 2 diabetes mellitus with hyperglycemia: Secondary | ICD-10-CM

## 2022-12-02 ENCOUNTER — Encounter: Payer: Self-pay | Admitting: Nurse Practitioner

## 2022-12-02 ENCOUNTER — Ambulatory Visit: Payer: BC Managed Care – PPO | Admitting: Nurse Practitioner

## 2022-12-02 VITALS — BP 107/73 | HR 86 | Ht 64.75 in | Wt 143.0 lb

## 2022-12-02 DIAGNOSIS — E559 Vitamin D deficiency, unspecified: Secondary | ICD-10-CM

## 2022-12-02 DIAGNOSIS — E1165 Type 2 diabetes mellitus with hyperglycemia: Secondary | ICD-10-CM | POA: Diagnosis not present

## 2022-12-02 DIAGNOSIS — E782 Mixed hyperlipidemia: Secondary | ICD-10-CM | POA: Diagnosis not present

## 2022-12-02 DIAGNOSIS — I10 Essential (primary) hypertension: Secondary | ICD-10-CM | POA: Diagnosis not present

## 2022-12-02 LAB — POCT GLYCOSYLATED HEMOGLOBIN (HGB A1C): Hemoglobin A1C: 14.4 % — AB (ref 4.0–5.6)

## 2022-12-02 MED ORDER — LEVEMIR FLEXPEN 100 UNIT/ML ~~LOC~~ SOPN: 40.0000 [IU] | PEN_INJECTOR | Freq: Every day | SUBCUTANEOUS | 3 refills | Status: DC

## 2022-12-02 MED ORDER — FREESTYLE LIBRE 3 SENSOR MISC: 3 refills | Status: DC

## 2022-12-02 MED ORDER — NOVOLOG FLEXPEN 100 UNIT/ML ~~LOC~~ SOPN: 8.0000 [IU] | PEN_INJECTOR | Freq: Three times a day (TID) | SUBCUTANEOUS | 3 refills | Status: DC

## 2022-12-02 NOTE — Progress Notes (Signed)
12/02/2022           Endocrinology follow-up note  Subjective:    Patient ID: [name], female    DOB: Jan 29, 1971.  she is being seen in follow-up for management of currently uncontrolled symptomatic diabetes requested by  Dairl Ponder, MD.   Past Medical History:  Diagnosis Date   Diabetes mellitus without complication ([place])    Infection    bladder infection   Ovarian cyst    Vaginal Pap smear, abnormal    laser removal of abn cells; normal since   Varicose veins during pregnancy, antepartum    Past Surgical History:  Procedure Laterality Date   CERVICAL CERCLAGE N/A 10/12/2014   Procedure: CERCLAGE CERVICAL;  Surgeon: Luz Lex, MD;  Location: [facility];  Service: Gynecology;  Laterality: N/A;   OOPHORECTOMY     OVARIAN CYST REMOVAL     Social History   Socioeconomic History   Marital status: Married    Spouse name: Not on file   Number of children: Not on file   Years of education: Not on file   Highest education level: Not on file  Occupational History   Not on file  Tobacco Use   Smoking status: Never   Smokeless tobacco: Never  Vaping Use   Vaping Use: Never used  Substance and Sexual Activity   Alcohol use: Yes    Comment: rare, none with pregnancy   Drug use: No   Sexual activity: Not Currently    Birth control/protection: Pill    Comment: on pill when found out pregnant  Other Topics Concern   Not on file  Social History Narrative   Not on file   Social Determinants of Health   Financial Resource Strain: Not on file  Food Insecurity: Not on file  Transportation Needs: Not on file  Physical Activity: Not on file  Stress: Not on file  Social Connections: Not on file   Outpatient Encounter Medications as of 12/02/2022  Medication Sig   atorvastatin (LIPITOR) 10 MG tablet daily.   B-D ULTRAFINE III SHORT PEN 31G X 8 MM MISC USE AS DIRECTED   Cholecalciferol (VITAMIN D) 50 MCG (2000 UT) tablet Take 2,000 Units by mouth daily.    Continuous Blood Gluc Sensor (FREESTYLE LIBRE 3 SENSOR) MISC Place 1 sensor on the skin every 14 days. Use to check glucose continuously   glipiZIDE (GLUCOTROL XL) 5 MG 24 hr tablet TAKE 1 TABLET BY MOUTH EVERY DAY WITH BREAKFAST   insulin aspart (NOVOLOG FLEXPEN) 100 UNIT/ML FlexPen Inject 8-14 Units into the skin 3 (three) times daily with meals.   levonorgestrel (MIRENA, 52 MG,) 20 MCG/24HR IUD Mirena 20 mcg/24 hours (7 yrs) 52 mg intrauterine device   lisinopril (PRINIVIL,ZESTRIL) 10 MG tablet Take 10 mg by mouth daily.   sitaGLIPtin-metformin (JANUMET) 50-1000 MG tablet Take 1 tablet by mouth 2 (two) times daily.   vitamin B-12 (CYANOCOBALAMIN) 100 MCG tablet Take 100 mcg by mouth daily.   vitamin C (ASCORBIC ACID) 500 MG tablet Take 500 mg by mouth daily.   Vitamin D, Ergocalciferol, (DRISDOL) 1.25 MG (50000 UNIT) CAPS capsule Take 50,000 Units by mouth once a week.   zinc gluconate 50 MG tablet Take 50 mg by mouth daily.   [DISCONTINUED] Continuous Blood Gluc Sensor (FREESTYLE LIBRE 14 DAY SENSOR) MISC CHANGE SENSOR EVERY 14 DAYS   [DISCONTINUED] LEVEMIR FLEXPEN 100 UNIT/ML FlexPen Inject 40 Units into the skin at bedtime.   glucose blood (ONETOUCH VERIO) test strip  Use as instructed (Patient not taking: Reported on 12/02/2022)   LEVEMIR FLEXPEN 100 UNIT/ML FlexPen Inject 40 Units into the skin at bedtime.   No facility-administered encounter medications on file as of 12/02/2022.    ALLERGIES: No Known Allergies  VACCINATION STATUS: Immunization History  Administered Date(s) Administered   Tdap 01/22/2015    Diabetes She presents for her follow-up diabetic visit. She has type 2 diabetes mellitus. Onset time: She was diagnosed at approximate age of 34 years. Her disease course has been worsening. There are no hypoglycemic associated symptoms. Pertinent negatives for hypoglycemia include no confusion, headaches, pallor or seizures. Associated symptoms include blurred vision, fatigue,  polydipsia, polyuria and weight loss. Pertinent negatives for diabetes include no chest pain and no polyphagia. There are no hypoglycemic complications. Symptoms are worsening. There are no diabetic complications. Risk factors for coronary artery disease include dyslipidemia, diabetes mellitus, family history, sedentary lifestyle, obesity and hypertension. Current diabetic treatment includes insulin injections and oral agent (triple therapy). She is compliant with treatment most of the time. Her weight is decreasing steadily. She is following a generally unhealthy diet. When asked about meal planning, she reported none. She has had a previous visit with a dietitian. She participates in exercise intermittently. Her overall blood glucose range is >200 mg/dl. (She presents today with her CGM showing no data since Feb 22.  She says she needs refill of her sensors but did not call to say she had run out.  She notes she had the flu and her glucose readings have been bad ever since.  Her POCT A1c today is 14.4%, increasing from last visit of 11%. ) An ACE inhibitor/angiotensin II receptor blocker is being taken. She does not see a podiatrist.Eye exam is current.  Hypertension This is a chronic problem. The current episode started more than 1 year ago. The problem has been resolved since onset. The problem is controlled. Associated symptoms include blurred vision. Pertinent negatives include no chest pain, headaches, palpitations or shortness of breath. There are no associated agents to hypertension. Risk factors for coronary artery disease include dyslipidemia, diabetes mellitus and sedentary lifestyle. Past treatments include ACE inhibitors. The current treatment provides moderate improvement. There are no compliance problems.   Hyperlipidemia This is a chronic problem. The current episode started more than 1 year ago. The problem is controlled. Recent lipid tests were reviewed and are normal. Exacerbating diseases  include diabetes and obesity. There are no known factors aggravating her hyperlipidemia. Pertinent negatives include no chest pain or shortness of breath. Current antihyperlipidemic treatment includes statins. The current treatment provides moderate improvement of lipids. There are no compliance problems.  Risk factors for coronary artery disease include diabetes mellitus, hypertension, dyslipidemia and obesity.    Review of systems  Constitutional: + steadily decreasing body weight,  current Body mass index is 23.98 kg/m. , no fatigue, no subjective hyperthermia, no subjective hypothermia Eyes: no blurry vision, no xerophthalmia ENT: no sore throat, no nodules palpated in throat, no dysphagia/odynophagia, no hoarseness Cardiovascular: no chest pain, no shortness of breath, no palpitations, no leg swelling Respiratory: no cough, no shortness of breath Gastrointestinal: no nausea/vomiting/diarrhea Musculoskeletal: no muscle/joint aches Skin: no rashes, no hyperemia Neurological: no tremors, no numbness, no tingling, no dizziness Psychiatric: no depression, no anxiety    Objective:    BP 107/73 (BP Location: Left Arm, Patient Position: Sitting, Cuff Size: Normal)   Pulse 86   Ht 5' 4.75" (1.645 m)   Wt 143 lb (64.9 kg)  BMI 23.98 kg/m   Wt Readings from Last 3 Encounters:  12/02/22 143 lb (64.9 kg)  06/25/22 158 lb 6.4 oz (71.8 kg)  03/25/22 155 lb (70.3 kg)    BP Readings from Last 3 Encounters:  12/02/22 107/73  06/25/22 125/83  03/25/22 113/78     Physical Exam- Limited  Constitutional:  Body mass index is 23.98 kg/m. , not in acute distress, normal state of mind Eyes:  EOMI, no exophthalmos Musculoskeletal: no gross deformities, strength intact in all four extremities, no gross restriction of joint movements Skin:  no rashes, no hyperemia Neurological: no tremor with outstretched hands   Diabetic Foot Exam - Simple   No data filed     CMP ( most  recent) CMP     Component Value Date/Time   NA 136 (A) 07/03/2022 0000   K 4.8 07/03/2022 0000   CL 103 07/03/2022 0000   CO2 24 (A) 07/03/2022 0000   GLUCOSE 416 (H) 12/04/2021 0958   GLUCOSE 79 01/14/2015 1720   BUN 11 07/03/2022 0000   CREATININE 0.6 07/03/2022 0000   CREATININE 0.74 12/04/2021 0958   CALCIUM 9.4 07/03/2022 0000   PROT 7.0 12/04/2021 0958   ALBUMIN 4.5 07/03/2022 0000   ALBUMIN 4.7 12/04/2021 0958   AST 11 (A) 07/03/2022 0000   ALT 9 07/03/2022 0000   ALKPHOS 27 07/03/2022 0000   BILITOT 1.2 12/04/2021 0958   GFRNONAA 98 08/01/2020 1024   GFRAA 113 08/01/2020 1024    Diabetic Labs (most recent): Lab Results  Component Value Date   HGBA1C 14.4 (A) 12/02/2022   HGBA1C 11.0 (A) 06/25/2022   HGBA1C 11.8 03/25/2022   MICROALBUR 10 12/23/2021   MICROALBUR 30 11/12/2020     Lipid Panel ( most recent) Lipid Panel     Component Value Date/Time   CHOL 177 07/03/2022 0000   CHOL 126 08/01/2020 1024   TRIG 111 07/03/2022 0000   HDL 57 07/03/2022 0000   HDL 52 08/01/2020 1024   CHOLHDL 2.4 08/01/2020 1024   LDLCALC 99 07/03/2022 0000   LDLCALC 53 08/01/2020 1024     Assessment & Plan:   1) Uncontrolled type 2 diabetes mellitus with hyperglycemia ([place])  - Patient has currently uncontrolled symptomatic type 2 DM since 52 years of age.  She presents today with her CGM showing no data since Feb 22.  She says she needs refill of her sensors but did not call to say she had run out.  She notes she had the flu and her glucose readings have been bad ever since.  Her POCT A1c today is 14.4%, increasing from last visit of 11%.    -Recent labs reviewed.   - She does not report any gross complications  from her diabetes, however,  [name] remains at a high risk for more acute and chronic complications which include CAD, CVA, CKD, retinopathy, and neuropathy. These are all discussed in detail with the patient.  The following Lifestyle Medicine  recommendations according to [facility]) were discussed and offered to patient and she agrees to start the journey:  A. Whole Foods, Plant-based plate comprising of fruits and vegetables, plant-based proteins, whole-grain carbohydrates was discussed in detail with the patient.   A list for source of those nutrients were also provided to the patient.  Patient will use only water or unsweetened tea for hydration. B.  The need to stay away from risky substances including alcohol, smoking; obtaining 7 to 9 hours  of restorative sleep, at least 150 minutes of moderate intensity exercise weekly, the importance of healthy social connections,  and stress reduction techniques were discussed. C.  A full color page of  Calorie density of various food groups per pound showing examples of each food groups was provided to the patient.  - Nutritional counseling repeated at each appointment due to patients tendency to fall back in to old habits.  - The patient admits there is a room for improvement in their diet and drink choices. -  Suggestion is made for the patient to avoid simple carbohydrates from their diet including Cakes, Sweet Desserts / Pastries, Ice Cream, Soda (diet and regular), Sweet Tea, Candies, Chips, Cookies, Sweet Pastries, Store Bought Juices, Alcohol in Excess of 1-2 drinks a day, Artificial Sweeteners, Coffee Creamer, and "Sugar-free" Products. This will help patient to have stable blood glucose profile and potentially avoid unintended weight gain.   - I encouraged the patient to switch to unprocessed or minimally processed complex starch and increased protein intake (animal or plant source), fruits, and vegetables.   - Patient is advised to stick to a routine mealtimes to eat 3 meals a day and avoid unnecessary snacks (to snack only to correct hypoglycemia).  - I have approached her with the following individualized plan to manage diabetes and patient agrees:    -She is advised to continue Levemir 40 units SQ nightly, Glipizide 5 mg XL daily with breakfast and Janumet 50/1000 mg po twice daily with meals.  I discussed and initated prandial insulin Novolog 8-14 units TID with meals if glucose is above 90 and she is eating (Specific instructions on how to titrate insulin dosage based on glucose readings given to patient in writing).  She demonstrated her ability to properly use the SSI chart to dose meal insulin with me today.  -She is encouraged to continue using her CGM to monitor blood glucose 4 times daily, before meals and before bed, and to call the clinic if she has readings less than 70 or greater than 300 for 3 tests in a row.  I sent in for upgrade in CGM to [place] [facility] to pharmacy for her.  - Patient specific target  A1c;  LDL, HDL, Triglycerides,  were discussed in detail.  2) BP/HTN:  Her blood pressure is controlled to target.  She is advised to continue current medications including Lisinopril 10 mg p.o. daily.    3) Lipids/HPL:  Her most recent lipid panel from 07/03/22 shows controlled LDL at 99.  She is advised to continue Lipitor 10 mg po daily at bedtime.  Side effects and precautions discussed with her.    4)  Weight/Diet:  Her Body mass index is 23.98 kg/m. - she is a candidate for modest weight loss.  CDE Consult has been  re-initiated , exercise, and detailed carbohydrates information provided.  5) Vitamin D deficiency:  Her last vitamin D level on 12/04/21 was 27.3.  She has completed replenishment with Ergocalciferol.  She is taking Vitamin D3 2000 units daily as maintenance dose.    6) Chronic Care/Health Maintenance: -she is on ACEI and Statin medications and is encouraged to continue to follow up with Ophthalmology, Dentist,  Podiatrist at least yearly or according to recommendations, and advised to stay away from smoking. I have recommended yearly flu vaccine and pneumonia vaccination at least every 5 years; moderate intensity  exercise for up to 150 minutes weekly; and  sleep for at least 7 hours a day.   -  I advised patient to maintain close follow up with Dairl Ponder, MD for primary care needs.      I spent  30  minutes in the care of the patient today including review of labs from [facility], Lipids, Thyroid Function, Hematology (current and previous including abstractions from other facilities); face-to-face time discussing  her blood glucose readings/logs, discussing hypoglycemia and hyperglycemia episodes and symptoms, medications doses, her options of short and long term treatment based on the latest standards of care / guidelines;  discussion about incorporating lifestyle medicine;  and documenting the encounter. Risk reduction counseling performed per USPSTF guidelines to reduce obesity and cardiovascular risk factors.     Please refer to Patient Instructions for Blood Glucose Monitoring and Insulin/Medications Dosing Guide"  in media tab for additional information. Please  also refer to " Patient Self Inventory" in the Media  tab for reviewed elements of pertinent patient history.  [name] participated in the discussions, expressed understanding, and voiced agreement with the above plans.  All questions were answered to her satisfaction. she is encouraged to contact clinic should she have any questions or concerns prior to her return visit.    Follow up plan: - Return in about 4 weeks (around 12/30/2022) for Diabetes F/U, Bring meter and logs.  [name], [facility] [address] [place], [place] [address] Phone: [phone] Fax: [phone]  12/02/2022, 9:29 AM

## 2022-12-10 ENCOUNTER — Telehealth: Payer: Self-pay | Admitting: *Deleted

## 2022-12-10 NOTE — Telephone Encounter (Signed)
Patient left a message that she is having issues with low blood sugars.  Patient was called and a voice mail was left asking that she call our office back. I did request her last three day readings.

## 2022-12-11 ENCOUNTER — Telehealth: Payer: Self-pay | Admitting: *Deleted

## 2022-12-11 NOTE — Telephone Encounter (Signed)
Patient states that on 12/09/2022 she started having blood sugar drops. At 4:30 am it was 54 , she ate a mini chocolate donut and it brought it up. At lunch time it was 231 , 6:30 pm it was 326, 8:46 pm it was 54 , and at 9:43 pm it was 54.  This morning it dropped at 5 am to 64. She ate a small  piece of hershey chocolate nut candy and it brought it up to 91, while talking to me , 11:20 am it was 178.  Reviewed with Whitney and she advises the patient to adjust the following medications. Levemir to 30 units at bedtime, and her Novolog 5-11 units three times a day and continue to check blood sugars four times a day.  Patient was called and made aware.

## 2022-12-27 ENCOUNTER — Other Ambulatory Visit: Payer: Self-pay | Admitting: Nurse Practitioner

## 2022-12-27 DIAGNOSIS — E1165 Type 2 diabetes mellitus with hyperglycemia: Secondary | ICD-10-CM

## 2023-01-08 ENCOUNTER — Ambulatory Visit (INDEPENDENT_AMBULATORY_CARE_PROVIDER_SITE_OTHER): Payer: BC Managed Care – PPO | Admitting: Nurse Practitioner

## 2023-01-08 ENCOUNTER — Encounter: Payer: Self-pay | Admitting: Nurse Practitioner

## 2023-01-08 VITALS — BP 104/70 | HR 66 | Ht 64.75 in | Wt 148.2 lb

## 2023-01-08 DIAGNOSIS — E559 Vitamin D deficiency, unspecified: Secondary | ICD-10-CM

## 2023-01-08 DIAGNOSIS — E782 Mixed hyperlipidemia: Secondary | ICD-10-CM

## 2023-01-08 DIAGNOSIS — I10 Essential (primary) hypertension: Secondary | ICD-10-CM | POA: Diagnosis not present

## 2023-01-08 DIAGNOSIS — E1165 Type 2 diabetes mellitus with hyperglycemia: Secondary | ICD-10-CM

## 2023-01-08 MED ORDER — JANUMET 50-1000 MG PO TABS: 1.0000 | ORAL_TABLET | Freq: Two times a day (BID) | ORAL | 3 refills | Status: DC

## 2023-01-08 MED ORDER — NOVOLOG FLEXPEN 100 UNIT/ML ~~LOC~~ SOPN: 2.0000 [IU] | PEN_INJECTOR | Freq: Three times a day (TID) | SUBCUTANEOUS | 3 refills | Status: DC

## 2023-01-08 MED ORDER — FREESTYLE LIBRE 3 SENSOR MISC: 3 refills | Status: DC

## 2023-01-08 MED ORDER — LEVEMIR FLEXPEN 100 UNIT/ML ~~LOC~~ SOPN: 20.0000 [IU] | PEN_INJECTOR | Freq: Every day | SUBCUTANEOUS | 3 refills | Status: DC

## 2023-01-08 MED ORDER — GLIPIZIDE ER 5 MG PO TB24: 5.0000 mg | ORAL_TABLET | Freq: Every day | ORAL | 3 refills | Status: DC

## 2023-01-08 NOTE — Progress Notes (Signed)
01/08/2023           Endocrinology follow-up note  Subjective:    Patient ID: [name], female    DOB: Jan 27, 1971.  she is being seen in follow-up for management of currently uncontrolled symptomatic diabetes requested by  Craig Staggers, MD.   Past Medical History:  Diagnosis Date   Diabetes mellitus without complication    Infection    bladder infection   Ovarian cyst    Vaginal Pap smear, abnormal    laser removal of abn cells; normal since   Varicose veins during pregnancy, antepartum    Past Surgical History:  Procedure Laterality Date   CERVICAL CERCLAGE N/A 10/12/2014   Procedure: CERCLAGE CERVICAL;  Surgeon: Turner Daniels, MD;  Location: [facility];  Service: Gynecology;  Laterality: N/A;   OOPHORECTOMY     OVARIAN CYST REMOVAL     Social History   Socioeconomic History   Marital status: Married    Spouse name: Not on file   Number of children: Not on file   Years of education: Not on file   Highest education level: Not on file  Occupational History   Not on file  Tobacco Use   Smoking status: Never   Smokeless tobacco: Never  Vaping Use   Vaping Use: Never used  Substance and Sexual Activity   Alcohol use: Yes    Comment: rare, none with pregnancy   Drug use: No   Sexual activity: Not Currently    Birth control/protection: Pill    Comment: on pill when found out pregnant  Other Topics Concern   Not on file  Social History Narrative   Not on file   Social Determinants of Health   Financial Resource Strain: Not on file  Food Insecurity: Not on file  Transportation Needs: Not on file  Physical Activity: Not on file  Stress: Not on file  Social Connections: Not on file   Outpatient Encounter Medications as of 01/08/2023  Medication Sig   atorvastatin (LIPITOR) 10 MG tablet daily.   B-D ULTRAFINE III SHORT PEN 31G X 8 MM MISC USE AS DIRECTED   Cholecalciferol (VITAMIN D) 50 MCG (2000 UT) tablet Take 2,000 Units by mouth daily.    levonorgestrel (MIRENA, 52 MG,) 20 MCG/24HR IUD Mirena 20 mcg/24 hours (7 yrs) 52 mg intrauterine device   lisinopril (PRINIVIL,ZESTRIL) 10 MG tablet Take 10 mg by mouth daily.   vitamin B-12 (CYANOCOBALAMIN) 100 MCG tablet Take 100 mcg by mouth daily.   vitamin C (ASCORBIC ACID) 500 MG tablet Take 500 mg by mouth daily.   Vitamin D, Ergocalciferol, (DRISDOL) 1.25 MG (50000 UNIT) CAPS capsule Take 50,000 Units by mouth once a week.   zinc gluconate 50 MG tablet Take 50 mg by mouth daily.   [DISCONTINUED] Continuous Blood Gluc Sensor (FREESTYLE LIBRE 3 SENSOR) MISC Place 1 sensor on the skin every 14 days. Use to check glucose continuously   [DISCONTINUED] glipiZIDE (GLUCOTROL XL) 5 MG 24 hr tablet TAKE 1 TABLET BY MOUTH EVERY DAY WITH BREAKFAST   [DISCONTINUED] insulin aspart (NOVOLOG FLEXPEN) 100 UNIT/ML FlexPen Inject 8-14 Units into the skin 3 (three) times daily with meals.   [DISCONTINUED] LEVEMIR FLEXPEN 100 UNIT/ML FlexPen Inject 40 Units into the skin at bedtime.   [DISCONTINUED] sitaGLIPtin-metformin (JANUMET) 50-1000 MG tablet Take 1 tablet by mouth 2 (two) times daily.   Continuous Glucose Sensor (FREESTYLE LIBRE 3 SENSOR) MISC Place 1 sensor on the skin every 14 days. Use to check glucose  continuously   glipiZIDE (GLUCOTROL XL) 5 MG 24 hr tablet Take 1 tablet (5 mg total) by mouth daily with breakfast.   glucose blood (ONETOUCH VERIO) test strip Use as instructed (Patient not taking: Reported on 12/02/2022)   insulin aspart (NOVOLOG FLEXPEN) 100 UNIT/ML FlexPen Inject 2-8 Units into the skin 3 (three) times daily with meals.   LEVEMIR FLEXPEN 100 UNIT/ML FlexPen Inject 20 Units into the skin at bedtime.   sitaGLIPtin-metformin (JANUMET) 50-1000 MG tablet Take 1 tablet by mouth 2 (two) times daily.   No facility-administered encounter medications on file as of 01/08/2023.    ALLERGIES: No Known Allergies  VACCINATION STATUS: Immunization History  Administered Date(s) Administered    Tdap 01/22/2015    Diabetes She presents for her follow-up diabetic visit. She has type 2 diabetes mellitus. Onset time: She was diagnosed at approximate age of 52 years. Her disease course has been improving. There are no hypoglycemic associated symptoms. Pertinent negatives for hypoglycemia include no confusion, headaches, pallor or seizures. Associated symptoms include blurred vision, fatigue, polydipsia, polyuria and weight loss. Pertinent negatives for diabetes include no chest pain and no polyphagia. There are no hypoglycemic complications. Symptoms are improving. There are no diabetic complications. Risk factors for coronary artery disease include dyslipidemia, diabetes mellitus, family history, sedentary lifestyle, obesity and hypertension. Current diabetic treatment includes insulin injections and oral agent (triple therapy). She is compliant with treatment most of the time. Her weight is fluctuating minimally. She is following a generally unhealthy diet. When asked about meal planning, she reported none. She has had a previous visit with a dietitian. She participates in exercise intermittently. Her home blood glucose trend is decreasing steadily. Her overall blood glucose range is 180-200 mg/dl. (She presents today with her CGM showing improving yet still above target glycemic profile.  She was not due for another A1c today.  Analysis of her CGM shows TIR 42%,TAR 57%, TBR 1% with a GMI of 8.1%.) An ACE inhibitor/angiotensin II receptor blocker is being taken. She does not see a podiatrist.Eye exam is current.  Hypertension This is a chronic problem. The current episode started more than 1 year ago. The problem has been resolved since onset. The problem is controlled. Associated symptoms include blurred vision. Pertinent negatives include no chest pain, headaches, palpitations or shortness of breath. There are no associated agents to hypertension. Risk factors for coronary artery disease include  dyslipidemia, diabetes mellitus and sedentary lifestyle. Past treatments include ACE inhibitors. The current treatment provides moderate improvement. There are no compliance problems.   Hyperlipidemia This is a chronic problem. The current episode started more than 1 year ago. The problem is controlled. Recent lipid tests were reviewed and are normal. Exacerbating diseases include diabetes and obesity. There are no known factors aggravating her hyperlipidemia. Pertinent negatives include no chest pain or shortness of breath. Current antihyperlipidemic treatment includes statins. The current treatment provides moderate improvement of lipids. There are no compliance problems.  Risk factors for coronary artery disease include diabetes mellitus, hypertension, dyslipidemia and obesity.    Review of systems  Constitutional: + minimally fluctuating body weight,  current Body mass index is 24.85 kg/m. , no fatigue, no subjective hyperthermia, no subjective hypothermia Eyes: no blurry vision, no xerophthalmia ENT: no sore throat, no nodules palpated in throat, no dysphagia/odynophagia, no hoarseness Cardiovascular: no chest pain, no shortness of breath, no palpitations, no leg swelling Respiratory: no cough, no shortness of breath Gastrointestinal: no nausea/vomiting/diarrhea Musculoskeletal: no muscle/joint aches Skin: no rashes, no hyperemia  Neurological: no tremors, no numbness, no tingling, no dizziness Psychiatric: no depression, no anxiety    Objective:    BP 104/70 (BP Location: Left Arm, Patient Position: Sitting, Cuff Size: Large)   Pulse 66   Ht 5' 4.75" (1.645 m)   Wt 148 lb 3.2 oz (67.2 kg)   BMI 24.85 kg/m   Wt Readings from Last 3 Encounters:  01/08/23 148 lb 3.2 oz (67.2 kg)  12/02/22 143 lb (64.9 kg)  06/25/22 158 lb 6.4 oz (71.8 kg)    BP Readings from Last 3 Encounters:  01/08/23 104/70  12/02/22 107/73  06/25/22 125/83     Physical Exam- Limited  Constitutional:   Body mass index is 24.85 kg/m. , not in acute distress, normal state of mind Eyes:  EOMI, no exophthalmos Musculoskeletal: no gross deformities, strength intact in all four extremities, no gross restriction of joint movements Skin:  no rashes, no hyperemia Neurological: no tremor with outstretched hands   Diabetic Foot Exam - Simple   No data filed     CMP ( most recent) CMP     Component Value Date/Time   NA 136 (A) 07/03/2022 0000   K 4.8 07/03/2022 0000   CL 103 07/03/2022 0000   CO2 24 (A) 07/03/2022 0000   GLUCOSE 416 (H) 12/04/2021 0958   GLUCOSE 79 01/14/2015 1720   BUN 11 07/03/2022 0000   CREATININE 0.6 07/03/2022 0000   CREATININE 0.74 12/04/2021 0958   CALCIUM 9.4 07/03/2022 0000   PROT 7.0 12/04/2021 0958   ALBUMIN 4.5 07/03/2022 0000   ALBUMIN 4.7 12/04/2021 0958   AST 11 (A) 07/03/2022 0000   ALT 9 07/03/2022 0000   ALKPHOS 27 07/03/2022 0000   BILITOT 1.2 12/04/2021 0958   GFRNONAA 98 08/01/2020 1024   GFRAA 113 08/01/2020 1024    Diabetic Labs (most recent): Lab Results  Component Value Date   HGBA1C 14.4 (A) 12/02/2022   HGBA1C 11.0 (A) 06/25/2022   HGBA1C 11.8 03/25/2022   MICROALBUR 10 12/23/2021   MICROALBUR 30 11/12/2020     Lipid Panel ( most recent) Lipid Panel     Component Value Date/Time   CHOL 177 07/03/2022 0000   CHOL 126 08/01/2020 1024   TRIG 111 07/03/2022 0000   HDL 57 07/03/2022 0000   HDL 52 08/01/2020 1024   CHOLHDL 2.4 08/01/2020 1024   LDLCALC 99 07/03/2022 0000   LDLCALC 53 08/01/2020 1024     Assessment & Plan:   1) Uncontrolled type 2 diabetes mellitus with hyperglycemia (HCC)  - Patient has currently uncontrolled symptomatic type 2 DM since 52 years of age.  She presents today with her CGM showing improving yet still above target glycemic profile.  She was not due for another A1c today.  Analysis of her CGM shows TIR 42%,TAR 57%, TBR 1% with a GMI of 8.1%.   -Recent labs reviewed.   - She does not report  any gross complications  from her diabetes, however,  [name] remains at a high risk for more acute and chronic complications which include CAD, CVA, CKD, retinopathy, and neuropathy. These are all discussed in detail with the patient.  The following Lifestyle Medicine recommendations according to American College of Lifestyle Medicine [facility]) were discussed and offered to patient and she agrees to start the journey:  A. Whole Foods, Plant-based plate comprising of fruits and vegetables, plant-based proteins, whole-grain carbohydrates was discussed in detail with the patient.   A list for source of those nutrients  were also provided to the patient.  Patient will use only water or unsweetened tea for hydration. B.  The need to stay away from risky substances including alcohol, smoking; obtaining 7 to 9 hours of restorative sleep, at least 150 minutes of moderate intensity exercise weekly, the importance of healthy social connections,  and stress reduction techniques were discussed. C.  A full color page of  Calorie density of various food groups per pound showing examples of each food groups was provided to the patient.  - Nutritional counseling repeated at each appointment due to patients tendency to fall back in to old habits.  - The patient admits there is a room for improvement in their diet and drink choices. -  Suggestion is made for the patient to avoid simple carbohydrates from their diet including Cakes, Sweet Desserts / Pastries, Ice Cream, Soda (diet and regular), Sweet Tea, Candies, Chips, Cookies, Sweet Pastries, Store Bought Juices, Alcohol in Excess of 1-2 drinks a day, Artificial Sweeteners, Coffee Creamer, and "Sugar-free" Products. This will help patient to have stable blood glucose profile and potentially avoid unintended weight gain.   - I encouraged the patient to switch to unprocessed or minimally processed complex starch and increased protein intake (animal or plant source),  fruits, and vegetables.   - Patient is advised to stick to a routine mealtimes to eat 3 meals a day and avoid unnecessary snacks (to snack only to correct hypoglycemia).  - I have approached her with the following individualized plan to manage diabetes and patient agrees:   -She is advised to continue Levemir 20 units SQ nightly, lower Novolog to 2-8 units TID with meals if glucose is above 90 and eating (Specific instructions on how to titrate insulin dosage based on glucose readings given to patient in writing).  She can continue Glipizide 5 mg XL daily with breakfast and Janumet 50/1000 mg po twice daily with meals.    -She is encouraged to continue using her CGM to monitor blood glucose 4 times daily, before meals and before bed, and to call the clinic if she has readings less than 70 or greater than 300 for 3 tests in a row.  I sent in for upgrade in CGM to libre 3 to pharmacy for her.  - Patient specific target  A1c;  LDL, HDL, Triglycerides,  were discussed in detail.  2) BP/HTN:  Her blood pressure is controlled to target.  She is advised to continue current medications including Lisinopril 10 mg p.o. daily.    3) Lipids/HPL:  Her most recent lipid panel from 07/03/22 shows controlled LDL at 99.  She is advised to continue Lipitor 10 mg po daily at bedtime.  Side effects and precautions discussed with her.    4)  Weight/Diet:  Her Body mass index is 24.85 kg/m. - she is a candidate for modest weight loss.  CDE Consult has been  re-initiated , exercise, and detailed carbohydrates information provided.  5) Vitamin D deficiency:  Her last vitamin D level on 12/04/21 was 27.3.  She has completed replenishment with Ergocalciferol.  She is taking Vitamin D3 2000 units daily as maintenance dose.    6) Chronic Care/Health Maintenance: -she is on ACEI and Statin medications and is encouraged to continue to follow up with Ophthalmology, Dentist,  Podiatrist at least yearly or according to  recommendations, and advised to stay away from smoking. I have recommended yearly flu vaccine and pneumonia vaccination at least every 5 years; moderate intensity exercise for up  to 150 minutes weekly; and  sleep for at least 7 hours a day.   - I advised patient to maintain close follow up with Craig Staggers, MD for primary care needs.     I spent  35  minutes in the care of the patient today including review of labs from CMP, Lipids, Thyroid Function, Hematology (current and previous including abstractions from other facilities); face-to-face time discussing  her blood glucose readings/logs, discussing hypoglycemia and hyperglycemia episodes and symptoms, medications doses, her options of short and long term treatment based on the latest standards of care / guidelines;  discussion about incorporating lifestyle medicine;  and documenting the encounter. Risk reduction counseling performed per USPSTF guidelines to reduce obesity and cardiovascular risk factors.     Please refer to Patient Instructions for Blood Glucose Monitoring and Insulin/Medications Dosing Guide"  in media tab for additional information. Please  also refer to " Patient Self Inventory" in the Media  tab for reviewed elements of pertinent patient history.  [name] participated in the discussions, expressed understanding, and voiced agreement with the above plans.  All questions were answered to her satisfaction. she is encouraged to contact clinic should she have any questions or concerns prior to her return visit.    Follow up plan: - Return in about 3 months (around 04/09/2023) for Diabetes F/U with A1c in office, No previsit labs, Bring meter and logs.  Ronny Bacon, [facility] [address] [place], [place] [address] Phone: [phone] Fax: [phone]  01/08/2023, 10:52 AM

## 2023-02-11 ENCOUNTER — Other Ambulatory Visit: Payer: Self-pay

## 2023-02-11 MED ORDER — BD PEN NEEDLE SHORT U/F 31G X 8 MM MISC: 2 refills | Status: DC

## 2023-02-16 ENCOUNTER — Other Ambulatory Visit: Payer: Self-pay | Admitting: Nurse Practitioner

## 2023-02-24 LAB — COLOGUARD: COLOGUARD: NEGATIVE

## 2023-04-19 ENCOUNTER — Encounter: Payer: Self-pay | Admitting: Nurse Practitioner

## 2023-04-19 ENCOUNTER — Ambulatory Visit: Payer: BC Managed Care – PPO | Admitting: Nurse Practitioner

## 2023-04-19 VITALS — BP 115/79 | HR 91 | Ht 64.75 in | Wt 149.4 lb

## 2023-04-19 DIAGNOSIS — I10 Essential (primary) hypertension: Secondary | ICD-10-CM

## 2023-04-19 DIAGNOSIS — E1165 Type 2 diabetes mellitus with hyperglycemia: Secondary | ICD-10-CM | POA: Diagnosis not present

## 2023-04-19 DIAGNOSIS — Z7984 Long term (current) use of oral hypoglycemic drugs: Secondary | ICD-10-CM | POA: Diagnosis not present

## 2023-04-19 DIAGNOSIS — E782 Mixed hyperlipidemia: Secondary | ICD-10-CM | POA: Diagnosis not present

## 2023-04-19 DIAGNOSIS — E559 Vitamin D deficiency, unspecified: Secondary | ICD-10-CM

## 2023-04-19 DIAGNOSIS — Z794 Long term (current) use of insulin: Secondary | ICD-10-CM | POA: Diagnosis not present

## 2023-04-19 LAB — POCT UA - MICROALBUMIN

## 2023-04-19 LAB — POCT GLYCOSYLATED HEMOGLOBIN (HGB A1C): Hemoglobin A1C: 11.9 % — AB (ref 4.0–5.6)

## 2023-04-19 MED ORDER — LEVEMIR FLEXPEN 100 UNIT/ML ~~LOC~~ SOPN: 30.0000 [IU] | PEN_INJECTOR | Freq: Every day | SUBCUTANEOUS | 3 refills | Status: DC

## 2023-04-19 MED ORDER — LISINOPRIL 10 MG PO TABS: 10.0000 mg | ORAL_TABLET | Freq: Every day | ORAL | 1 refills | Status: DC

## 2023-04-19 MED ORDER — NOVOLOG FLEXPEN 100 UNIT/ML ~~LOC~~ SOPN: 6.0000 [IU] | PEN_INJECTOR | Freq: Three times a day (TID) | SUBCUTANEOUS | 3 refills | Status: DC

## 2023-04-19 NOTE — Progress Notes (Signed)
04/19/2023           Endocrinology follow-up note  Subjective:    Patient ID: [name], female    DOB: 03-Apr-1971.  she is being seen in follow-up for management of currently uncontrolled symptomatic diabetes requested by  Craig Staggers, MD.   Past Medical History:  Diagnosis Date   Diabetes mellitus without complication (HCC)    Infection    bladder infection   Ovarian cyst    Vaginal Pap smear, abnormal    laser removal of abn cells; normal since   Varicose veins during pregnancy, antepartum    Past Surgical History:  Procedure Laterality Date   CERVICAL CERCLAGE N/A 10/12/2014   Procedure: CERCLAGE CERVICAL;  Surgeon: Turner Daniels, MD;  Location: [facility];  Service: Gynecology;  Laterality: N/A;   OOPHORECTOMY     OVARIAN CYST REMOVAL     Social History   Socioeconomic History   Marital status: Married    Spouse name: Not on file   Number of children: Not on file   Years of education: Not on file   Highest education level: Not on file  Occupational History   Not on file  Tobacco Use   Smoking status: Never   Smokeless tobacco: Never  Vaping Use   Vaping status: Never Used  Substance and Sexual Activity   Alcohol use: Yes    Comment: rare, none with pregnancy   Drug use: No   Sexual activity: Not Currently    Birth control/protection: Pill    Comment: on pill when found out pregnant  Other Topics Concern   Not on file  Social History Narrative   Not on file   Social Determinants of Health   Financial Resource Strain: Not on file  Food Insecurity: Not on file  Transportation Needs: Not on file  Physical Activity: Not on file  Stress: Not on file  Social Connections: Not on file   Outpatient Encounter Medications as of 04/19/2023  Medication Sig   atorvastatin (LIPITOR) 10 MG tablet daily.   Cholecalciferol (VITAMIN D) 50 MCG (2000 UT) tablet Take 2,000 Units by mouth daily.   Continuous Glucose Sensor (FREESTYLE LIBRE 3 SENSOR) MISC Place  1 sensor on the skin every 14 days. Use to check glucose continuously   glipiZIDE (GLUCOTROL XL) 5 MG 24 hr tablet Take 1 tablet (5 mg total) by mouth daily with breakfast.   Insulin Pen Needle (B-D ULTRAFINE III SHORT PEN) 31G X 8 MM MISC Use with insulin qid. E11.65   levonorgestrel (MIRENA, 52 MG,) 20 MCG/24HR IUD Mirena 20 mcg/24 hours (7 yrs) 52 mg intrauterine device   sitaGLIPtin-metformin (JANUMET) 50-1000 MG tablet Take 1 tablet by mouth 2 (two) times daily.   vitamin B-12 (CYANOCOBALAMIN) 100 MCG tablet Take 100 mcg by mouth daily.   vitamin C (ASCORBIC ACID) 500 MG tablet Take 500 mg by mouth daily.   Vitamin D, Ergocalciferol, (DRISDOL) 1.25 MG (50000 UNIT) CAPS capsule Take 50,000 Units by mouth once a week.   zinc gluconate 50 MG tablet Take 50 mg by mouth daily.   [DISCONTINUED] insulin aspart (NOVOLOG FLEXPEN) 100 UNIT/ML FlexPen Inject 2-8 Units into the skin 3 (three) times daily with meals.   [DISCONTINUED] LEVEMIR FLEXPEN 100 UNIT/ML FlexPen Inject 20 Units into the skin at bedtime.   [DISCONTINUED] lisinopril (PRINIVIL,ZESTRIL) 10 MG tablet Take 10 mg by mouth daily.   glucose blood (ONETOUCH VERIO) test strip Use as instructed (Patient not taking: Reported on 12/02/2022)  insulin aspart (NOVOLOG FLEXPEN) 100 UNIT/ML FlexPen Inject 6-12 Units into the skin 3 (three) times daily with meals.   LEVEMIR FLEXPEN 100 UNIT/ML FlexPen Inject 30 Units into the skin at bedtime.   lisinopril (ZESTRIL) 10 MG tablet Take 1 tablet (10 mg total) by mouth daily.   No facility-administered encounter medications on file as of 04/19/2023.    ALLERGIES: No Known Allergies  VACCINATION STATUS: Immunization History  Administered Date(s) Administered   Tdap 01/22/2015    Diabetes She presents for her follow-up diabetic visit. She has type 2 diabetes mellitus. Onset time: She was diagnosed at approximate age of 52 years. Her disease course has been improving. There are no hypoglycemic  associated symptoms. Pertinent negatives for hypoglycemia include no confusion, headaches, pallor or seizures. Associated symptoms include blurred vision, fatigue, polydipsia, polyuria and weight loss. Pertinent negatives for diabetes include no chest pain and no polyphagia. There are no hypoglycemic complications. Symptoms are improving. There are no diabetic complications. Risk factors for coronary artery disease include dyslipidemia, diabetes mellitus, family history, sedentary lifestyle, obesity and hypertension. Current diabetic treatment includes oral agent (triple therapy) and intensive insulin program. She is compliant with treatment most of the time (has forgotten her mealtime insulin quite frequently). Her weight is fluctuating minimally. She is following a generally unhealthy diet. When asked about meal planning, she reported none. She has had a previous visit with a dietitian. She participates in exercise intermittently. Her home blood glucose trend is fluctuating minimally. Her breakfast blood glucose range is generally >200 mg/dl. Her lunch blood glucose range is generally >200 mg/dl. Her dinner blood glucose range is generally >200 mg/dl. Her bedtime blood glucose range is generally >200 mg/dl. Her overall blood glucose range is >200 mg/dl. (She presents today with her CGM showing improving yet still significantly above target glycemic profile.  Her POCT A1c today is 11.9%, improving from last visit of 14.4%.  Analysis of her CGM shows TIR 0%,TAR 100%, TBR 0% with a GMI of 11.9%. She notes she has missed several doses of her meal time insulin due to her busy schedule.  ) An ACE inhibitor/angiotensin II receptor blocker is being taken. She does not see a podiatrist.Eye exam is current.  Hypertension This is a chronic problem. The current episode started more than 1 year ago. The problem has been resolved since onset. The problem is controlled. Associated symptoms include blurred vision. Pertinent  negatives include no chest pain, headaches, palpitations or shortness of breath. There are no associated agents to hypertension. Risk factors for coronary artery disease include dyslipidemia, diabetes mellitus and sedentary lifestyle. Past treatments include ACE inhibitors. The current treatment provides moderate improvement. There are no compliance problems.   Hyperlipidemia This is a chronic problem. The current episode started more than 1 year ago. The problem is controlled. Recent lipid tests were reviewed and are normal. Exacerbating diseases include diabetes and obesity. There are no known factors aggravating her hyperlipidemia. Pertinent negatives include no chest pain or shortness of breath. Current antihyperlipidemic treatment includes statins. The current treatment provides moderate improvement of lipids. There are no compliance problems.  Risk factors for coronary artery disease include diabetes mellitus, hypertension, dyslipidemia and obesity.    Review of systems  Constitutional: + minimally fluctuating body weight,  current Body mass index is 25.05 kg/m. , no fatigue, no subjective hyperthermia, no subjective hypothermia Eyes: no blurry vision, no xerophthalmia ENT: no sore throat, no nodules palpated in throat, no dysphagia/odynophagia, no hoarseness Cardiovascular: no chest pain, no  shortness of breath, no palpitations, no leg swelling Respiratory: no cough, no shortness of breath Gastrointestinal: no nausea/vomiting/diarrhea Musculoskeletal: no muscle/joint aches Skin: no rashes, no hyperemia Neurological: no tremors, no numbness, no tingling, no dizziness Psychiatric: no depression, no anxiety    Objective:    BP 115/79 (BP Location: Left Arm, Patient Position: Sitting, Cuff Size: Normal)   Pulse 91   Ht 5' 4.75" (1.645 m)   Wt 149 lb 6.4 oz (67.8 kg)   BMI 25.05 kg/m   Wt Readings from Last 3 Encounters:  04/19/23 149 lb 6.4 oz (67.8 kg)  01/08/23 148 lb 3.2 oz (67.2  kg)  12/02/22 143 lb (64.9 kg)    BP Readings from Last 3 Encounters:  04/19/23 115/79  01/08/23 104/70  12/02/22 107/73     Physical Exam- Limited  Constitutional:  Body mass index is 25.05 kg/m. , not in acute distress, normal state of mind Eyes:  EOMI, no exophthalmos Musculoskeletal: no gross deformities, strength intact in all four extremities, no gross restriction of joint movements Skin:  no rashes, no hyperemia Neurological: no tremor with outstretched hands   Diabetic Foot Exam - Simple   Simple Foot Form Diabetic Foot exam was performed with the following findings: Yes 04/19/2023  9:45 AM  Visual Inspection See comments: Yes Sensation Testing Intact to touch and monofilament testing bilaterally: Yes Pulse Check Posterior Tibialis and Dorsalis pulse intact bilaterally: Yes Comments Left foot second toenail black (from trauma-stubbed toe), calluses bilaterally     CMP ( most recent) CMP     Component Value Date/Time   NA 136 (A) 07/03/2022 0000   K 4.8 07/03/2022 0000   CL 103 07/03/2022 0000   CO2 24 (A) 07/03/2022 0000   GLUCOSE 416 (H) 12/04/2021 0958   GLUCOSE 79 01/14/2015 1720   BUN 11 07/03/2022 0000   CREATININE 0.6 07/03/2022 0000   CREATININE 0.74 12/04/2021 0958   CALCIUM 9.4 07/03/2022 0000   PROT 7.0 12/04/2021 0958   ALBUMIN 4.5 07/03/2022 0000   ALBUMIN 4.7 12/04/2021 0958   AST 11 (A) 07/03/2022 0000   ALT 9 07/03/2022 0000   ALKPHOS 27 07/03/2022 0000   BILITOT 1.2 12/04/2021 0958   GFRNONAA 98 08/01/2020 1024   GFRAA 113 08/01/2020 1024    Diabetic Labs (most recent): Lab Results  Component Value Date   HGBA1C 11.9 (A) 04/19/2023   HGBA1C 14.4 (A) 12/02/2022   HGBA1C 11.0 (A) 06/25/2022   MICROALBUR 30mg /L 04/19/2023   MICROALBUR 10 12/23/2021   MICROALBUR 30 11/12/2020     Lipid Panel ( most recent) Lipid Panel     Component Value Date/Time   CHOL 177 07/03/2022 0000   CHOL 126 08/01/2020 1024   TRIG 111 07/03/2022  0000   HDL 57 07/03/2022 0000   HDL 52 08/01/2020 1024   CHOLHDL 2.4 08/01/2020 1024   LDLCALC 99 07/03/2022 0000   LDLCALC 53 08/01/2020 1024     Assessment & Plan:   1) Uncontrolled type 2 diabetes mellitus with hyperglycemia (HCC)  - Patient has currently uncontrolled symptomatic type 2 DM since 52 years of age.  She presents today with her CGM showing improving yet still significantly above target glycemic profile.  Her POCT A1c today is 11.9%, improving from last visit of 14.4%.  Analysis of her CGM shows TIR 0%,TAR 100%, TBR 0% with a GMI of 11.9%. She notes she has missed several doses of her meal time insulin due to her busy schedule.     -  Recent labs reviewed.   - She does not report any gross complications  from her diabetes, however,  Kelen Bem remains at a high risk for more acute and chronic complications which include CAD, CVA, CKD, retinopathy, and neuropathy. These are all discussed in detail with the patient.  The following Lifestyle Medicine recommendations according to American College of Lifestyle Medicine [facility]) were discussed and offered to patient and she agrees to start the journey:  A. Whole Foods, Plant-based plate comprising of fruits and vegetables, plant-based proteins, whole-grain carbohydrates was discussed in detail with the patient.   A list for source of those nutrients were also provided to the patient.  Patient will use only water or unsweetened tea for hydration. B.  The need to stay away from risky substances including alcohol, smoking; obtaining 7 to 9 hours of restorative sleep, at least 150 minutes of moderate intensity exercise weekly, the importance of healthy social connections,  and stress reduction techniques were discussed. C.  A full color page of  Calorie density of various food groups per pound showing examples of each food groups was provided to the patient.  - Nutritional counseling repeated at each appointment due to patients tendency  to fall back in to old habits.  - The patient admits there is a room for improvement in their diet and drink choices. -  Suggestion is made for the patient to avoid simple carbohydrates from their diet including Cakes, Sweet Desserts / Pastries, Ice Cream, Soda (diet and regular), Sweet Tea, Candies, Chips, Cookies, Sweet Pastries, Store Bought Juices, Alcohol in Excess of 1-2 drinks a day, Artificial Sweeteners, Coffee Creamer, and "Sugar-free" Products. This will help patient to have stable blood glucose profile and potentially avoid unintended weight gain.   - I encouraged the patient to switch to unprocessed or minimally processed complex starch and increased protein intake (animal or plant source), fruits, and vegetables.   - Patient is advised to stick to a routine mealtimes to eat 3 meals a day and avoid unnecessary snacks (to snack only to correct hypoglycemia).  - I have approached her with the following individualized plan to manage diabetes and patient agrees:   -She is advised to increase her Levemir to 30 units SQ nightly, increase her Novolog to 6-12 units TID with meals if glucose is above 90 and eating (Specific instructions on how to titrate insulin dosage based on glucose readings given to patient in writing).  She can continue Glipizide 5 mg XL daily with breakfast and Janumet 50/1000 mg po twice daily with meals.    -She is encouraged to continue using her CGM to monitor blood glucose 4 times daily, before meals and before bed, and to call the clinic if she has readings less than 70 or greater than 300 for 3 tests in a row.    - Patient specific target  A1c;  LDL, HDL, Triglycerides,  were discussed in detail.  2) BP/HTN:  Her blood pressure is controlled to target.  She is advised to continue current medications including Lisinopril 10 mg p.o. daily.    3) Lipids/HPL:  Her most recent lipid panel from 07/03/22 shows controlled LDL at 99.  She is advised to continue Lipitor  10 mg po daily at bedtime.  Side effects and precautions discussed with her.  Will recheck lipid panel prior to next visit.  4)  Weight/Diet:  Her Body mass index is 25.05 kg/m. - she is a candidate for modest weight loss.  CDE Consult has  been  re-initiated , exercise, and detailed carbohydrates information provided.  5) Vitamin D deficiency:  Her last vitamin D level on 12/04/21 was 27.3.  She has completed replenishment with Ergocalciferol.  She is taking Vitamin D3 2000 units daily as maintenance dose.   Will recheck Vitamin D prior to next visit.  6) Chronic Care/Health Maintenance: -she is on ACEI and Statin medications and is encouraged to continue to follow up with Ophthalmology, Dentist,  Podiatrist at least yearly or according to recommendations, and advised to stay away from smoking. I have recommended yearly flu vaccine and pneumonia vaccination at least every 5 years; moderate intensity exercise for up to 150 minutes weekly; and  sleep for at least 7 hours a day.   - I advised patient to maintain close follow up with Craig Staggers, MD for primary care needs.     I spent  46  minutes in the care of the patient today including review of labs from CMP, Lipids, Thyroid Function, Hematology (current and previous including abstractions from other facilities); face-to-face time discussing  her blood glucose readings/logs, discussing hypoglycemia and hyperglycemia episodes and symptoms, medications doses, her options of short and long term treatment based on the latest standards of care / guidelines;  discussion about incorporating lifestyle medicine;  and documenting the encounter. Risk reduction counseling performed per USPSTF guidelines to reduce obesity and cardiovascular risk factors.     Please refer to Patient Instructions for Blood Glucose Monitoring and Insulin/Medications Dosing Guide"  in media tab for additional information. Please  also refer to " Patient Self Inventory" in  the Media  tab for reviewed elements of pertinent patient history.  [name] participated in the discussions, expressed understanding, and voiced agreement with the above plans.  All questions were answered to her satisfaction. she is encouraged to contact clinic should she have any questions or concerns prior to her return visit.    Follow up plan: - Return in about 3 months (around 07/20/2023) for Diabetes F/U with A1c in office, Previsit labs, Bring meter and logs.  Ronny Bacon, [facility] [address] [place], [place] [address] Phone: [phone] Fax: [phone]  04/19/2023, 9:52 AM

## 2023-06-18 ENCOUNTER — Other Ambulatory Visit: Payer: Self-pay | Admitting: Nurse Practitioner

## 2023-07-04 ENCOUNTER — Other Ambulatory Visit: Payer: Self-pay | Admitting: Nurse Practitioner

## 2023-07-17 LAB — VITAMIN D 25 HYDROXY (VIT D DEFICIENCY, FRACTURES): Vit D, 25-Hydroxy: 24.7 ng/mL — ABNORMAL LOW (ref 30.0–100.0)

## 2023-07-17 LAB — COMPREHENSIVE METABOLIC PANEL
ALT: 9 [IU]/L (ref 0–32)
AST: 10 [IU]/L (ref 0–40)
Albumin: 4.5 g/dL (ref 3.9–4.9)
Alkaline Phosphatase: 36 [IU]/L — ABNORMAL LOW (ref 44–121)
BUN/Creatinine Ratio: 12 (ref 9–23)
BUN: 10 mg/dL (ref 6–24)
Bilirubin Total: 1.1 mg/dL (ref 0.0–1.2)
CO2: 20 mmol/L (ref 20–29)
Calcium: 9.4 mg/dL (ref 8.7–10.2)
Chloride: 94 mmol/L — ABNORMAL LOW (ref 96–106)
Creatinine, Ser: 0.85 mg/dL (ref 0.57–1.00)
Globulin, Total: 2.6 g/dL (ref 1.5–4.5)
Glucose: 395 mg/dL — ABNORMAL HIGH (ref 70–99)
Potassium: 4.5 mmol/L (ref 3.5–5.2)
Sodium: 131 mmol/L — ABNORMAL LOW (ref 134–144)
Total Protein: 7.1 g/dL (ref 6.0–8.5)
eGFR: 83 mL/min/{1.73_m2} (ref >59)

## 2023-07-17 LAB — LIPID PANEL
Chol/HDL Ratio: 3.6 {ratio} (ref 0.0–4.4)
Cholesterol, Total: 178 mg/dL (ref 100–199)
HDL: 50 mg/dL (ref >39)
LDL Chol Calc (NIH): 93 mg/dL (ref 0–99)
Triglycerides: 204 mg/dL — ABNORMAL HIGH (ref 0–149)
VLDL Cholesterol Cal: 35 mg/dL (ref 5–40)

## 2023-07-17 LAB — TSH: TSH: 0.9 u[IU]/mL (ref 0.450–4.500)

## 2023-07-17 LAB — T4, FREE: Free T4: 1.26 ng/dL (ref 0.82–1.77)

## 2023-07-23 ENCOUNTER — Encounter: Payer: Self-pay | Admitting: Nurse Practitioner

## 2023-07-23 ENCOUNTER — Ambulatory Visit: Payer: BC Managed Care – PPO | Admitting: Nurse Practitioner

## 2023-07-23 VITALS — BP 108/72 | HR 102 | Ht 64.75 in | Wt 147.4 lb

## 2023-07-23 DIAGNOSIS — E559 Vitamin D deficiency, unspecified: Secondary | ICD-10-CM

## 2023-07-23 DIAGNOSIS — Z794 Long term (current) use of insulin: Secondary | ICD-10-CM | POA: Diagnosis not present

## 2023-07-23 DIAGNOSIS — I10 Essential (primary) hypertension: Secondary | ICD-10-CM | POA: Diagnosis not present

## 2023-07-23 DIAGNOSIS — Z7984 Long term (current) use of oral hypoglycemic drugs: Secondary | ICD-10-CM | POA: Diagnosis not present

## 2023-07-23 DIAGNOSIS — E1165 Type 2 diabetes mellitus with hyperglycemia: Secondary | ICD-10-CM

## 2023-07-23 DIAGNOSIS — E782 Mixed hyperlipidemia: Secondary | ICD-10-CM | POA: Diagnosis not present

## 2023-07-23 LAB — POCT GLYCOSYLATED HEMOGLOBIN (HGB A1C): Hemoglobin A1C: 13.1 % — AB (ref 4.0–5.6)

## 2023-07-23 MED ORDER — BD PEN NEEDLE SHORT U/F 31G X 8 MM MISC: 2 refills | Status: DC

## 2023-07-23 MED ORDER — JANUMET 50-1000 MG PO TABS: 1.0000 | ORAL_TABLET | Freq: Two times a day (BID) | ORAL | 3 refills | Status: DC

## 2023-07-23 MED ORDER — NOVOLOG FLEXPEN 100 UNIT/ML ~~LOC~~ SOPN: 6.0000 [IU] | PEN_INJECTOR | Freq: Three times a day (TID) | SUBCUTANEOUS | 3 refills | Status: DC

## 2023-07-23 MED ORDER — GLIPIZIDE ER 5 MG PO TB24
5.0000 mg | ORAL_TABLET | Freq: Every day | ORAL | 3 refills | Status: DC
Start: 2023-07-23 — End: 2024-08-16

## 2023-07-23 MED ORDER — INSULIN GLARGINE-YFGN 100 UNIT/ML ~~LOC~~ SOPN: 30.0000 [IU] | PEN_INJECTOR | Freq: Every day | SUBCUTANEOUS | 0 refills | Status: DC

## 2023-07-23 MED ORDER — ATORVASTATIN CALCIUM 10 MG PO TABS: 10.0000 mg | ORAL_TABLET | Freq: Every day | ORAL | 3 refills | Status: DC

## 2023-07-23 MED ORDER — FREESTYLE LIBRE 3 SENSOR MISC: 3 refills | Status: DC

## 2023-07-23 NOTE — Progress Notes (Signed)
07/23/2023           Endocrinology follow-up note  Subjective:    Patient ID: [name], female    DOB: 10-Sep-1971.  she is being seen in follow-up for management of currently uncontrolled symptomatic diabetes requested by  Craig Staggers, MD.   Past Medical History:  Diagnosis Date   Diabetes mellitus without complication (HCC)    Infection    bladder infection   Ovarian cyst    Vaginal Pap smear, abnormal    laser removal of abn cells; normal since   Varicose veins during pregnancy, antepartum    Past Surgical History:  Procedure Laterality Date   CERVICAL CERCLAGE N/A 10/12/2014   Procedure: CERCLAGE CERVICAL;  Surgeon: Turner Daniels, MD;  Location: [facility];  Service: Gynecology;  Laterality: N/A;   OOPHORECTOMY     OVARIAN CYST REMOVAL     Social History   Socioeconomic History   Marital status: Married    Spouse name: Not on file   Number of children: Not on file   Years of education: Not on file   Highest education level: Not on file  Occupational History   Not on file  Tobacco Use   Smoking status: Never   Smokeless tobacco: Never  Vaping Use   Vaping status: Never Used  Substance and Sexual Activity   Alcohol use: Yes    Comment: rare, none with pregnancy   Drug use: No   Sexual activity: Not Currently    Birth control/protection: Pill    Comment: on pill when found out pregnant  Other Topics Concern   Not on file  Social History Narrative   Not on file   Social Determinants of Health   Financial Resource Strain: Not on file  Food Insecurity: Not on file  Transportation Needs: Not on file  Physical Activity: Not on file  Stress: Not on file  Social Connections: Not on file   Outpatient Encounter Medications as of 07/23/2023  Medication Sig   Cholecalciferol (VITAMIN D) 50 MCG (2000 UT) tablet Take 2,000 Units by mouth daily.   levonorgestrel (MIRENA, 52 MG,) 20 MCG/24HR IUD Mirena 20 mcg/24 hours (7 yrs) 52 mg intrauterine device    lisinopril (ZESTRIL) 10 MG tablet Take 1 tablet (10 mg total) by mouth daily.   vitamin B-12 (CYANOCOBALAMIN) 100 MCG tablet Take 100 mcg by mouth daily.   vitamin C (ASCORBIC ACID) 500 MG tablet Take 500 mg by mouth daily.   zinc gluconate 50 MG tablet Take 50 mg by mouth daily.   [DISCONTINUED] Continuous Glucose Sensor (FREESTYLE LIBRE 3 SENSOR) MISC Place 1 sensor on the skin every 14 days. Use to check glucose continuously   [DISCONTINUED] glipiZIDE (GLUCOTROL XL) 5 MG 24 hr tablet Take 1 tablet (5 mg total) by mouth daily with breakfast.   [DISCONTINUED] insulin aspart (NOVOLOG FLEXPEN) 100 UNIT/ML FlexPen Inject 6-12 Units into the skin 3 (three) times daily with meals.   [DISCONTINUED] insulin glargine-yfgn (SEMGLEE) 100 UNIT/ML Pen INJECT 30 UNITS INTO THE SKIN AT BEDTIME.   [DISCONTINUED] Insulin Pen Needle (B-D ULTRAFINE III SHORT PEN) 31G X 8 MM MISC Use with insulin qid. E11.65   [DISCONTINUED] sitaGLIPtin-metformin (JANUMET) 50-1000 MG tablet Take 1 tablet by mouth 2 (two) times daily.   atorvastatin (LIPITOR) 10 MG tablet Take 1 tablet (10 mg total) by mouth daily.   Continuous Glucose Sensor (FREESTYLE LIBRE 3 SENSOR) MISC Place 1 sensor on the skin every 14 days. Use to check glucose  continuously   glipiZIDE (GLUCOTROL XL) 5 MG 24 hr tablet Take 1 tablet (5 mg total) by mouth daily with breakfast.   glucose blood (ONETOUCH VERIO) test strip Use as instructed (Patient not taking: Reported on 12/02/2022)   insulin aspart (NOVOLOG FLEXPEN) 100 UNIT/ML FlexPen Inject 6-12 Units into the skin 3 (three) times daily with meals.   insulin glargine-yfgn (SEMGLEE) 100 UNIT/ML Pen Inject 30 Units into the skin at bedtime.   Insulin Pen Needle (B-D ULTRAFINE III SHORT PEN) 31G X 8 MM MISC Use with insulin qid. E11.65   sitaGLIPtin-metformin (JANUMET) 50-1000 MG tablet Take 1 tablet by mouth 2 (two) times daily.   Vitamin D, Ergocalciferol, (DRISDOL) 1.25 MG (50000 UNIT) CAPS capsule Take 50,000  Units by mouth once a week. (Patient not taking: Reported on 07/23/2023)   [DISCONTINUED] atorvastatin (LIPITOR) 10 MG tablet daily. (Patient not taking: Reported on 07/23/2023)   No facility-administered encounter medications on file as of 07/23/2023.    ALLERGIES: No Known Allergies  VACCINATION STATUS: Immunization History  Administered Date(s) Administered   Tdap 01/22/2015    Diabetes She presents for her follow-up diabetic visit. She has type 2 diabetes mellitus. Onset time: She was diagnosed at approximate age of 16 years. Her disease course has been worsening. There are no hypoglycemic associated symptoms. Pertinent negatives for hypoglycemia include no confusion, headaches, pallor or seizures. Associated symptoms include blurred vision, fatigue, polydipsia, polyuria and weight loss. Pertinent negatives for diabetes include no chest pain and no polyphagia. There are no hypoglycemic complications. Symptoms are improving. There are no diabetic complications. Risk factors for coronary artery disease include dyslipidemia, diabetes mellitus, family history, sedentary lifestyle, obesity and hypertension. Current diabetic treatment includes oral agent (triple therapy) and intensive insulin program. She is compliant with treatment none of the time. Her weight is fluctuating minimally. She is following a generally unhealthy diet. When asked about meal planning, she reported none. She has had a previous visit with a dietitian. She participates in exercise intermittently. Her home blood glucose trend is increasing rapidly. Her breakfast blood glucose range is generally >200 mg/dl. Her lunch blood glucose range is generally >200 mg/dl. Her dinner blood glucose range is generally >200 mg/dl. Her bedtime blood glucose range is generally >200 mg/dl. Her overall blood glucose range is >200 mg/dl. (She presents today with her CGM showing gross hyperglycemia overall.  Her POCT A1c today is 13.1%, increasing from  last visit of 11.9%.  She has not worn her CGM in about 2 weeks, says sensor fell off and she has not had a replacement sensor to put on, has plans to go to the pharmacy today to pick them up.) An ACE inhibitor/angiotensin II receptor blocker is being taken. She does not see a podiatrist.Eye exam is current.  Hypertension This is a chronic problem. The current episode started more than 1 year ago. The problem has been resolved since onset. The problem is controlled. Associated symptoms include blurred vision. Pertinent negatives include no chest pain, headaches, palpitations or shortness of breath. There are no associated agents to hypertension. Risk factors for coronary artery disease include dyslipidemia, diabetes mellitus and sedentary lifestyle. Past treatments include ACE inhibitors. The current treatment provides moderate improvement. There are no compliance problems.   Hyperlipidemia This is a chronic problem. The current episode started more than 1 year ago. The problem is controlled. Recent lipid tests were reviewed and are normal. Exacerbating diseases include diabetes and obesity. There are no known factors aggravating her hyperlipidemia. Pertinent negatives  include no chest pain or shortness of breath. Current antihyperlipidemic treatment includes statins. The current treatment provides moderate improvement of lipids. There are no compliance problems.  Risk factors for coronary artery disease include diabetes mellitus, hypertension, dyslipidemia and obesity.    Review of systems  Constitutional: + minimally fluctuating body weight,  current Body mass index is 24.72 kg/m. , no fatigue, no subjective hyperthermia, no subjective hypothermia Eyes: no blurry vision, no xerophthalmia ENT: no sore throat, no nodules palpated in throat, no dysphagia/odynophagia, no hoarseness Cardiovascular: no chest pain, no shortness of breath, no palpitations, no leg swelling Respiratory: no cough, no shortness  of breath Gastrointestinal: no nausea/vomiting/diarrhea Musculoskeletal: no muscle/joint aches Skin: no rashes, no hyperemia Neurological: no tremors, no numbness, no tingling, no dizziness Psychiatric: no depression, no anxiety    Objective:    BP 108/72 (BP Location: Left Arm, Patient Position: Sitting, Cuff Size: Large)   Pulse (!) 102   Ht 5' 4.75" (1.645 m)   Wt 147 lb 6.4 oz (66.9 kg)   BMI 24.72 kg/m   Wt Readings from Last 3 Encounters:  07/23/23 147 lb 6.4 oz (66.9 kg)  04/19/23 149 lb 6.4 oz (67.8 kg)  01/08/23 148 lb 3.2 oz (67.2 kg)    BP Readings from Last 3 Encounters:  07/23/23 108/72  04/19/23 115/79  01/08/23 104/70     Physical Exam- Limited  Constitutional:  Body mass index is 24.72 kg/m. , not in acute distress, normal state of mind Eyes:  EOMI, no exophthalmos Musculoskeletal: no gross deformities, strength intact in all four extremities, no gross restriction of joint movements Skin:  no rashes, no hyperemia Neurological: no tremor with outstretched hands   Diabetic Foot Exam - Simple   No data filed     CMP ( most recent) CMP     Component Value Date/Time   NA 131 (L) 07/16/2023 1314   K 4.5 07/16/2023 1314   CL 94 (L) 07/16/2023 1314   CO2 20 07/16/2023 1314   GLUCOSE 395 (H) 07/16/2023 1314   GLUCOSE 79 01/14/2015 1720   BUN 10 07/16/2023 1314   CREATININE 0.85 07/16/2023 1314   CALCIUM 9.4 07/16/2023 1314   PROT 7.1 07/16/2023 1314   ALBUMIN 4.5 07/16/2023 1314   AST 10 07/16/2023 1314   ALT 9 07/16/2023 1314   ALKPHOS 36 (L) 07/16/2023 1314   BILITOT 1.1 07/16/2023 1314   GFRNONAA 98 08/01/2020 1024   GFRAA 113 08/01/2020 1024    Diabetic Labs (most recent): Lab Results  Component Value Date   HGBA1C 13.1 (A) 07/23/2023   HGBA1C 11.9 (A) 04/19/2023   HGBA1C 14.4 (A) 12/02/2022   MICROALBUR 30mg /L 04/19/2023   MICROALBUR 10 12/23/2021   MICROALBUR 30 11/12/2020     Lipid Panel ( most recent) Lipid Panel      Component Value Date/Time   CHOL 178 07/16/2023 1314   TRIG 204 (H) 07/16/2023 1314   HDL 50 07/16/2023 1314   CHOLHDL 3.6 07/16/2023 1314   LDLCALC 93 07/16/2023 1314     Assessment & Plan:   1) Uncontrolled type 2 diabetes mellitus with hyperglycemia (HCC)  - Patient has currently uncontrolled symptomatic type 2 DM since 52 years of age.  She presents today with her CGM showing gross hyperglycemia overall.  Her POCT A1c today is 13.1%, increasing from last visit of 11.9%.  She has not worn her CGM in about 2 weeks, says sensor fell off and she has not had a replacement sensor to  put on, has plans to go to the pharmacy today to pick them up.    -Recent labs reviewed.   - She does not report any gross complications  from her diabetes, however,  Rozanna Cormany remains at a high risk for more acute and chronic complications which include CAD, CVA, CKD, retinopathy, and neuropathy. These are all discussed in detail with the patient.  The following Lifestyle Medicine recommendations according to American College of Lifestyle Medicine [facility]) were discussed and offered to patient and she agrees to start the journey:  A. Whole Foods, Plant-based plate comprising of fruits and vegetables, plant-based proteins, whole-grain carbohydrates was discussed in detail with the patient.   A list for source of those nutrients were also provided to the patient.  Patient will use only water or unsweetened tea for hydration. B.  The need to stay away from risky substances including alcohol, smoking; obtaining 7 to 9 hours of restorative sleep, at least 150 minutes of moderate intensity exercise weekly, the importance of healthy social connections,  and stress reduction techniques were discussed. C.  A full color page of  Calorie density of various food groups per pound showing examples of each food groups was provided to the patient.  - Nutritional counseling repeated at each appointment due to patients  tendency to fall back in to old habits.  - The patient admits there is a room for improvement in their diet and drink choices. -  Suggestion is made for the patient to avoid simple carbohydrates from their diet including Cakes, Sweet Desserts / Pastries, Ice Cream, Soda (diet and regular), Sweet Tea, Candies, Chips, Cookies, Sweet Pastries, Store Bought Juices, Alcohol in Excess of 1-2 drinks a day, Artificial Sweeteners, Coffee Creamer, and "Sugar-free" Products. This will help patient to have stable blood glucose profile and potentially avoid unintended weight gain.   - I encouraged the patient to switch to unprocessed or minimally processed complex starch and increased protein intake (animal or plant source), fruits, and vegetables.   - Patient is advised to stick to a routine mealtimes to eat 3 meals a day and avoid unnecessary snacks (to snack only to correct hypoglycemia).  - I have approached her with the following individualized plan to manage diabetes and patient agrees:   -She has a tendency to disengage from her healthcare.  She notes she has not been taking her medications consistently.  She reports she last took her Semglee on Tuesday.  She does not have a working CGM sensor on her arm right now.  So she has not had readings since 07/13/23.  -She is advised to restart her Semglee 30 units SQ nightly, Novolog 6-12 units TID with meals if glucose is above 90 and eating (Specific instructions on how to titrate insulin dosage based on glucose readings given to patient in writing), Glipizide 5 mg XL daily with breakfast and Janumet 50/1000 mg po twice daily with meals.    I did encourage her to reach out to her insurance to see if pumps would be affordable for her as it may get rid of the need for some of her other diabetes medications.  This may help her maintain consistency with her insulin injections.  -She is encouraged to continue using her CGM to monitor blood glucose 4 times daily,  before meals and before bed, and to call the clinic if she has readings less than 70 or greater than 300 for 3 tests in a row.    - Patient specific target  A1c;  LDL, HDL, Triglycerides,  were discussed in detail.  2) BP/HTN:  Her blood pressure is controlled to target.  She is advised to continue current medications including Lisinopril 10 mg p.o. daily.    3) Lipids/HPL:  Her most recent lipid panel from 07/03/22 shows controlled LDL at 99.  She is advised to continue Lipitor 10 mg po daily at bedtime.  Side effects and precautions discussed with her.  Will recheck lipid panel prior to next visit.  4)  Weight/Diet:  Her Body mass index is 24.72 kg/m. - she is a candidate for modest weight loss.  CDE Consult has been  re-initiated , exercise, and detailed carbohydrates information provided.  5) Vitamin D deficiency:  Her last vitamin D level on 12/04/21 was 27.3.  She has completed replenishment with Ergocalciferol.  She is taking Vitamin D3 2000 units daily as maintenance dose.   Will recheck Vitamin D prior to next visit.  6) Chronic Care/Health Maintenance: -she is on ACEI and Statin medications and is encouraged to continue to follow up with Ophthalmology, Dentist,  Podiatrist at least yearly or according to recommendations, and advised to stay away from smoking. I have recommended yearly flu vaccine and pneumonia vaccination at least every 5 years; moderate intensity exercise for up to 150 minutes weekly; and  sleep for at least 7 hours a day.   - I advised patient to maintain close follow up with Craig Staggers, MD for primary care needs.     I spent  45  minutes in the care of the patient today including review of labs from CMP, Lipids, Thyroid Function, Hematology (current and previous including abstractions from other facilities); face-to-face time discussing  her blood glucose readings/logs, discussing hypoglycemia and hyperglycemia episodes and symptoms, medications doses,  her options of short and long term treatment based on the latest standards of care / guidelines;  discussion about incorporating lifestyle medicine;  and documenting the encounter. Risk reduction counseling performed per USPSTF guidelines to reduce obesity and cardiovascular risk factors.     Please refer to Patient Instructions for Blood Glucose Monitoring and Insulin/Medications Dosing Guide"  in media tab for additional information. Please  also refer to " Patient Self Inventory" in the Media  tab for reviewed elements of pertinent patient history.  [name] participated in the discussions, expressed understanding, and voiced agreement with the above plans.  All questions were answered to her satisfaction. she is encouraged to contact clinic should she have any questions or concerns prior to her return visit.    Follow up plan: - Return in about 3 months (around 10/23/2023) for Diabetes F/U with A1c in office, No previsit labs, Bring meter and logs.  Ronny Bacon, [facility] [address] [place], [place] [address] Phone: [phone] Fax: [phone]  07/23/2023, 9:35 AM

## 2023-09-23 ENCOUNTER — Encounter: Payer: Self-pay | Admitting: Nurse Practitioner

## 2023-10-23 ENCOUNTER — Other Ambulatory Visit: Payer: Self-pay | Admitting: Nurse Practitioner

## 2023-10-29 ENCOUNTER — Other Ambulatory Visit: Payer: Self-pay | Admitting: Nurse Practitioner

## 2023-10-29 ENCOUNTER — Ambulatory Visit: Payer: BC Managed Care – PPO | Admitting: Nurse Practitioner

## 2023-10-29 ENCOUNTER — Encounter: Payer: Self-pay | Admitting: Nurse Practitioner

## 2023-10-29 VITALS — BP 110/80 | HR 99 | Ht 64.74 in | Wt 141.0 lb

## 2023-10-29 DIAGNOSIS — I10 Essential (primary) hypertension: Secondary | ICD-10-CM | POA: Diagnosis not present

## 2023-10-29 DIAGNOSIS — E559 Vitamin D deficiency, unspecified: Secondary | ICD-10-CM | POA: Diagnosis not present

## 2023-10-29 DIAGNOSIS — E1165 Type 2 diabetes mellitus with hyperglycemia: Secondary | ICD-10-CM

## 2023-10-29 DIAGNOSIS — Z794 Long term (current) use of insulin: Secondary | ICD-10-CM | POA: Diagnosis not present

## 2023-10-29 DIAGNOSIS — E782 Mixed hyperlipidemia: Secondary | ICD-10-CM

## 2023-10-29 DIAGNOSIS — Z7984 Long term (current) use of oral hypoglycemic drugs: Secondary | ICD-10-CM

## 2023-10-29 LAB — POCT GLYCOSYLATED HEMOGLOBIN (HGB A1C): Hemoglobin A1C: 13.6 % — AB (ref 4.0–5.6)

## 2023-10-29 MED ORDER — CEQUR SIMPLICITY INSERTER MISC: 0 refills | Status: DC

## 2023-10-29 MED ORDER — CEQUR SIMPLICITY 2U DEVI: 6 refills | Status: DC

## 2023-10-29 NOTE — Progress Notes (Signed)
 10/29/2023           Endocrinology follow-up note  Subjective:    Patient ID: [name], female    DOB: Oct 19, 1970.  she is being seen in follow-up for management of currently uncontrolled symptomatic diabetes requested by  Luetta Chew, MD.   Past Medical History:  Diagnosis Date   Diabetes mellitus without complication (HCC)    Infection    bladder infection   Ovarian cyst    Vaginal Pap smear, abnormal    laser removal of abn cells; normal since   Varicose veins during pregnancy, antepartum    Past Surgical History:  Procedure Laterality Date   CERVICAL CERCLAGE N/A 10/12/2014   Procedure: CERCLAGE CERVICAL;  Surgeon: Alm JAYSON Cook, MD;  Location: [facility];  Service: Gynecology;  Laterality: N/A;   OOPHORECTOMY     OVARIAN CYST REMOVAL     Social History   Socioeconomic History   Marital status: Married    Spouse name: Not on file   Number of children: Not on file   Years of education: Not on file   Highest education level: Not on file  Occupational History   Not on file  Tobacco Use   Smoking status: Never   Smokeless tobacco: Never  Vaping Use   Vaping status: Never Used  Substance and Sexual Activity   Alcohol use: Yes    Comment: rare, none with pregnancy   Drug use: No   Sexual activity: Not Currently    Birth control/protection: Pill    Comment: on pill when found out pregnant  Other Topics Concern   Not on file  Social History Narrative   Not on file   Social Drivers of Health   Financial Resource Strain: Not on file  Food Insecurity: Not on file  Transportation Needs: Not on file  Physical Activity: Not on file  Stress: Not on file  Social Connections: Not on file   Outpatient Encounter Medications as of 10/29/2023  Medication Sig   atorvastatin  (LIPITOR) 10 MG tablet Take 1 tablet (10 mg total) by mouth daily.   Cholecalciferol (VITAMIN D ) 50 MCG (2000 UT) tablet Take 2,000 Units by mouth daily.   Continuous Glucose Sensor  (FREESTYLE LIBRE 3 SENSOR) MISC Place 1 sensor on the skin every 14 days. Use to check glucose continuously   glipiZIDE  (GLUCOTROL  XL) 5 MG 24 hr tablet Take 1 tablet (5 mg total) by mouth daily with breakfast.   injection device for insulin  (CEQUR SIMPLICITY 2U) DEVI Change every 3-4 days as directed.   Injection Device for Insulin  (CEQUR SIMPLICITY INSERTER) MISC Use as direct to insert Cequr device   insulin  aspart (NOVOLOG  FLEXPEN) 100 UNIT/ML FlexPen Inject 6-12 Units into the skin 3 (three) times daily with meals.   insulin  glargine-yfgn (SEMGLEE ) 100 UNIT/ML Pen Inject 30 Units into the skin at bedtime.   Insulin  Pen Needle (B-D ULTRAFINE III SHORT PEN) 31G X 8 MM MISC Use with insulin  qid. E11.65   levonorgestrel (MIRENA, 52 MG,) 20 MCG/24HR IUD Mirena 20 mcg/24 hours (7 yrs) 52 mg intrauterine device   lisinopril  (ZESTRIL ) 10 MG tablet TAKE 1 TABLET BY MOUTH EVERY DAY   sitaGLIPtin -metformin  (JANUMET ) 50-1000 MG tablet Take 1 tablet by mouth 2 (two) times daily.   vitamin B-12 (CYANOCOBALAMIN) 100 MCG tablet Take 100 mcg by mouth daily.   vitamin C (ASCORBIC ACID) 500 MG tablet Take 500 mg by mouth daily.   zinc gluconate 50 MG tablet Take 50 mg by  mouth daily.   glucose blood (ONETOUCH VERIO) test strip Use as instructed (Patient not taking: Reported on 10/29/2023)   Vitamin D , Ergocalciferol , (DRISDOL ) 1.25 MG (50000 UNIT) CAPS capsule Take 50,000 Units by mouth once a week. (Patient not taking: Reported on 10/29/2023)   No facility-administered encounter medications on file as of 10/29/2023.    ALLERGIES: No Known Allergies  VACCINATION STATUS: Immunization History  Administered Date(s) Administered   Tdap 01/22/2015    Diabetes She presents for her follow-up diabetic visit. She has type 2 diabetes mellitus. Onset time: She was diagnosed at approximate age of 53 years. Her disease course has been worsening. There are no hypoglycemic associated symptoms. Pertinent negatives for  hypoglycemia include no confusion, headaches, pallor or seizures. Associated symptoms include blurred vision, fatigue, polydipsia, polyuria and weight loss. Pertinent negatives for diabetes include no chest pain and no polyphagia. There are no hypoglycemic complications. Symptoms are improving. There are no diabetic complications. Risk factors for coronary artery disease include dyslipidemia, diabetes mellitus, family history, sedentary lifestyle, obesity and hypertension. Current diabetic treatment includes oral agent (triple therapy) and intensive insulin  program. She is compliant with treatment none of the time. Her weight is decreasing steadily. She is following a generally unhealthy diet. When asked about meal planning, she reported none. She has had a previous visit with a dietitian. She participates in exercise intermittently. Her home blood glucose trend is increasing rapidly. Her breakfast blood glucose range is generally >200 mg/dl. Her lunch blood glucose range is generally >200 mg/dl. Her dinner blood glucose range is generally >200 mg/dl. Her bedtime blood glucose range is generally >200 mg/dl. Her overall blood glucose range is >200 mg/dl. (She presents today with her CGM showing gross hyperglycemia overall.  Her POCT A1c today is 13.6%, increasing from last visit of 13.1%.  She still forgets to take her meal time insulin , is compliant with night time insulin  and pills mostly.  Analysis of her CGM shows TIR 0%, TAR 100% (99% in level 2 hyperglycemia range), TBR 0% with a GMI of 12.3%.) An ACE inhibitor/angiotensin II receptor blocker is being taken. She does not see a podiatrist.Eye exam is current.  Hypertension This is a chronic problem. The current episode started more than 1 year ago. The problem has been resolved since onset. The problem is controlled. Associated symptoms include blurred vision. Pertinent negatives include no chest pain, headaches, palpitations or shortness of breath. There are  no associated agents to hypertension. Risk factors for coronary artery disease include dyslipidemia, diabetes mellitus and sedentary lifestyle. Past treatments include ACE inhibitors. The current treatment provides moderate improvement. There are no compliance problems.   Hyperlipidemia This is a chronic problem. The current episode started more than 1 year ago. The problem is controlled. Recent lipid tests were reviewed and are normal. Exacerbating diseases include diabetes and obesity. There are no known factors aggravating her hyperlipidemia. Pertinent negatives include no chest pain or shortness of breath. Current antihyperlipidemic treatment includes statins. The current treatment provides moderate improvement of lipids. There are no compliance problems.  Risk factors for coronary artery disease include diabetes mellitus, hypertension, dyslipidemia and obesity.    Review of systems  Constitutional: + decreasing body weight,  current Body mass index is 23.65 kg/m. , no fatigue, no subjective hyperthermia, no subjective hypothermia Eyes: no blurry vision, no xerophthalmia ENT: no sore throat, no nodules palpated in throat, no dysphagia/odynophagia, no hoarseness Cardiovascular: no chest pain, no shortness of breath, no palpitations, no leg swelling Respiratory: no cough,  no shortness of breath Gastrointestinal: no nausea/vomiting/diarrhea Musculoskeletal: no muscle/joint aches Skin: no rashes, no hyperemia Neurological: no tremors, no numbness, no tingling, no dizziness Psychiatric: no depression, no anxiety    Objective:    BP 110/80 (BP Location: Left Arm, Patient Position: Sitting, Cuff Size: Large)   Pulse 99   Ht 5' 4.74 (1.644 m)   Wt 141 lb (64 kg)   BMI 23.65 kg/m   Wt Readings from Last 3 Encounters:  10/29/23 141 lb (64 kg)  07/23/23 147 lb 6.4 oz (66.9 kg)  04/19/23 149 lb 6.4 oz (67.8 kg)    BP Readings from Last 3 Encounters:  10/29/23 110/80  07/23/23 108/72   04/19/23 115/79     Physical Exam- Limited  Constitutional:  Body mass index is 23.65 kg/m. , not in acute distress, normal state of mind Eyes:  EOMI, no exophthalmos Musculoskeletal: no gross deformities, strength intact in all four extremities, no gross restriction of joint movements Skin:  no rashes, no hyperemia Neurological: no tremor with outstretched hands   Diabetic Foot Exam - Simple   No data filed     CMP ( most recent) CMP     Component Value Date/Time   NA 131 (L) 07/16/2023 1314   K 4.5 07/16/2023 1314   CL 94 (L) 07/16/2023 1314   CO2 20 07/16/2023 1314   GLUCOSE 395 (H) 07/16/2023 1314   GLUCOSE 79 01/14/2015 1720   BUN 10 07/16/2023 1314   CREATININE 0.85 07/16/2023 1314   CALCIUM  9.4 07/16/2023 1314   PROT 7.1 07/16/2023 1314   ALBUMIN 4.5 07/16/2023 1314   AST 10 07/16/2023 1314   ALT 9 07/16/2023 1314   ALKPHOS 36 (L) 07/16/2023 1314   BILITOT 1.1 07/16/2023 1314   GFRNONAA 98 08/01/2020 1024   GFRAA 113 08/01/2020 1024    Diabetic Labs (most recent): Lab Results  Component Value Date   HGBA1C 13.6 (A) 10/29/2023   HGBA1C 13.1 (A) 07/23/2023   HGBA1C 11.9 (A) 04/19/2023   MICROALBUR 30mg /L 04/19/2023   MICROALBUR 10 12/23/2021   MICROALBUR 30 11/12/2020     Lipid Panel ( most recent) Lipid Panel     Component Value Date/Time   CHOL 178 07/16/2023 1314   TRIG 204 (H) 07/16/2023 1314   HDL 50 07/16/2023 1314   CHOLHDL 3.6 07/16/2023 1314   LDLCALC 93 07/16/2023 1314     Assessment & Plan:   1) Uncontrolled type 2 diabetes mellitus with hyperglycemia (HCC)  - Patient has currently uncontrolled symptomatic type 2 DM since 53 years of age.  She presents today with her CGM showing gross hyperglycemia overall.  Her POCT A1c today is 13.6%, increasing from last visit of 13.1%.  She still forgets to take her meal time insulin , is compliant with night time insulin  and pills mostly.  Analysis of her CGM shows TIR 0%, TAR 100% (99% in  level 2 hyperglycemia range), TBR 0% with a GMI of 12.3%.   -Recent labs reviewed.   - She does not report any gross complications  from her diabetes, however,  Yatzary Merriweather remains at a high risk for more acute and chronic complications which include CAD, CVA, CKD, retinopathy, and neuropathy. These are all discussed in detail with the patient.  The following Lifestyle Medicine recommendations according to American College of Lifestyle Medicine [facility]) were discussed and offered to patient and she agrees to start the journey:  A. Whole Foods, Plant-based plate comprising of fruits and vegetables, plant-based proteins, whole-grain  carbohydrates was discussed in detail with the patient.   A list for source of those nutrients were also provided to the patient.  Patient will use only water or unsweetened tea for hydration. B.  The need to stay away from risky substances including alcohol, smoking; obtaining 7 to 9 hours of restorative sleep, at least 150 minutes of moderate intensity exercise weekly, the importance of healthy social connections,  and stress reduction techniques were discussed. C.  A full color page of  Calorie density of various food groups per pound showing examples of each food groups was provided to the patient.  - Nutritional counseling repeated at each appointment due to patients tendency to fall back in to old habits.  - The patient admits there is a room for improvement in their diet and drink choices. -  Suggestion is made for the patient to avoid simple carbohydrates from their diet including Cakes, Sweet Desserts / Pastries, Ice Cream, Soda (diet and regular), Sweet Tea, Candies, Chips, Cookies, Sweet Pastries, Store Bought Juices, Alcohol in Excess of 1-2 drinks a day, Artificial Sweeteners, Coffee Creamer, and Sugar-free Products. This will help patient to have stable blood glucose profile and potentially avoid unintended weight gain.   - I encouraged the patient to  switch to unprocessed or minimally processed complex starch and increased protein intake (animal or plant source), fruits, and vegetables.   - Patient is advised to stick to a routine mealtimes to eat 3 meals a day and avoid unnecessary snacks (to snack only to correct hypoglycemia).  - I have approached her with the following individualized plan to manage diabetes and patient agrees:   -She has a tendency to disengage from her healthcare.  She notes she has not been taking her medications consistently.  She did ask her insurance about pumps, and Omnipod 5 was not covered.  -She is advised to increase her Semglee  to 40 SQ nightly, continue Novolog  6-12 units TID with meals if glucose is above 90 and eating (Specific instructions on how to titrate insulin  dosage based on glucose readings given to patient in writing), Glipizide  5 mg XL daily with breakfast and Janumet  50/1000 mg po twice daily with meals.    I discussed and initiated trial of Cequr device so she can hopefully be more consistent with taking her insulin .  Snacks would take 4 units; small meals 6 units; medium meals 8 units; large meals 10 units.  -She is encouraged to continue using her CGM to monitor blood glucose 4 times daily, before meals and before bed, and to call the clinic if she has readings less than 70 or greater than 300 for 3 tests in a row.    - Patient specific target  A1c;  LDL, HDL, Triglycerides,  were discussed in detail.  2) BP/HTN:  Her blood pressure is controlled to target.  She is advised to continue current medications including Lisinopril  10 mg p.o. daily.    3) Lipids/HPL:  Her most recent lipid panel from 07/03/22 shows controlled LDL at 99.  She is advised to continue Lipitor 10 mg po daily at bedtime.  Side effects and precautions discussed with her.  Will recheck lipid panel prior to next visit.  4)  Weight/Diet:  Her Body mass index is 23.65 kg/m. - she is a candidate for modest weight loss.  CDE  Consult has been  re-initiated , exercise, and detailed carbohydrates information provided.  5) Vitamin D  deficiency:  Her last vitamin D  level on 12/04/21 was  27.3.  She has completed replenishment with Ergocalciferol .  She is taking Vitamin D3 2000 units daily as maintenance dose.   Will recheck Vitamin D  prior to next visit.  6) Chronic Care/Health Maintenance: -she is on ACEI and Statin medications and is encouraged to continue to follow up with Ophthalmology, Dentist,  Podiatrist at least yearly or according to recommendations, and advised to stay away from smoking. I have recommended yearly flu vaccine and pneumonia vaccination at least every 5 years; moderate intensity exercise for up to 150 minutes weekly; and  sleep for at least 7 hours a day.   - I advised patient to maintain close follow up with Vasireddy, Sabitha, MD for primary care needs.     I spent  45  minutes in the care of the patient today including review of labs from CMP, Lipids, Thyroid Function, Hematology (current and previous including abstractions from other facilities); face-to-face time discussing  her blood glucose readings/logs, discussing hypoglycemia and hyperglycemia episodes and symptoms, medications doses, her options of short and long term treatment based on the latest standards of care / guidelines;  discussion about incorporating lifestyle medicine;  and documenting the encounter. Risk reduction counseling performed per USPSTF guidelines to reduce obesity and cardiovascular risk factors.     Please refer to Patient Instructions for Blood Glucose Monitoring and Insulin /Medications Dosing Guide  in media tab for additional information. Please  also refer to  Patient Self Inventory in the Media  tab for reviewed elements of pertinent patient history.  Sari Finner participated in the discussions, expressed understanding, and voiced agreement with the above plans.  All questions were answered to her  satisfaction. she is encouraged to contact clinic should she have any questions or concerns prior to her return visit.    Follow up plan: - Return in about 3 months (around 01/26/2024) for Diabetes F/U with A1c in office, No previsit labs, Bring meter and logs.  Benton Rio, [facility] [address] [place], [place] [address] Phone: [phone] Fax: [phone]  10/29/2023, 10:23 AM

## 2023-11-01 NOTE — Telephone Encounter (Signed)
 I guess we just need to let the patient know Cequr isnt covered under her insurance plan.

## 2023-11-02 NOTE — Telephone Encounter (Signed)
She will unfortunately just have to stick with carrying her insulin pen with her.  She can try calling insurance and see why they wont cover it, maybe they will explain a bit more.

## 2023-11-02 NOTE — Telephone Encounter (Signed)
Left a message requesting pt return call to the office.

## 2023-11-02 NOTE — Telephone Encounter (Signed)
Pt made aware. Please advise of any changes.

## 2024-01-31 ENCOUNTER — Other Ambulatory Visit: Payer: Self-pay | Admitting: Nurse Practitioner

## 2024-02-02 ENCOUNTER — Encounter: Payer: Self-pay | Admitting: Nurse Practitioner

## 2024-02-02 ENCOUNTER — Other Ambulatory Visit: Payer: Self-pay | Admitting: Nurse Practitioner

## 2024-02-02 ENCOUNTER — Ambulatory Visit: Payer: BC Managed Care – PPO | Admitting: Nurse Practitioner

## 2024-02-02 VITALS — BP 104/72 | HR 94 | Ht 67.74 in | Wt 135.8 lb

## 2024-02-02 DIAGNOSIS — Z794 Long term (current) use of insulin: Secondary | ICD-10-CM

## 2024-02-02 DIAGNOSIS — E559 Vitamin D deficiency, unspecified: Secondary | ICD-10-CM

## 2024-02-02 DIAGNOSIS — E1165 Type 2 diabetes mellitus with hyperglycemia: Secondary | ICD-10-CM | POA: Diagnosis not present

## 2024-02-02 DIAGNOSIS — I10 Essential (primary) hypertension: Secondary | ICD-10-CM | POA: Diagnosis not present

## 2024-02-02 DIAGNOSIS — Z7984 Long term (current) use of oral hypoglycemic drugs: Secondary | ICD-10-CM | POA: Diagnosis not present

## 2024-02-02 DIAGNOSIS — E782 Mixed hyperlipidemia: Secondary | ICD-10-CM | POA: Diagnosis not present

## 2024-02-02 LAB — POCT GLYCOSYLATED HEMOGLOBIN (HGB A1C): Hemoglobin A1C: 15 % — AB (ref 4.0–5.6)

## 2024-02-02 MED ORDER — INSULIN GLARGINE-YFGN 100 UNIT/ML ~~LOC~~ SOPN: 80.0000 [IU] | PEN_INJECTOR | Freq: Every day | SUBCUTANEOUS | 3 refills | Status: DC

## 2024-02-02 NOTE — Progress Notes (Signed)
 02/02/2024           Endocrinology follow-up note  Subjective:    Patient ID: [name], female    DOB: Apr 13, 1971.  she is being seen in follow-up for management of currently uncontrolled symptomatic diabetes requested by  Leandro Proffer, MD.   Past Medical History:  Diagnosis Date   Diabetes mellitus without complication (HCC)    Infection    bladder infection   Ovarian cyst    Vaginal Pap smear, abnormal    laser removal of abn cells; normal since   Varicose veins during pregnancy, antepartum    Past Surgical History:  Procedure Laterality Date   CERVICAL CERCLAGE N/A 10/12/2014   Procedure: CERCLAGE CERVICAL;  Surgeon: Denette Finner, MD;  Location: [facility];  Service: Gynecology;  Laterality: N/A;   OOPHORECTOMY     OVARIAN CYST REMOVAL     Social History   Socioeconomic History   Marital status: Married    Spouse name: Not on file   Number of children: Not on file   Years of education: Not on file   Highest education level: Not on file  Occupational History   Not on file  Tobacco Use   Smoking status: Never   Smokeless tobacco: Never  Vaping Use   Vaping status: Never Used  Substance and Sexual Activity   Alcohol use: Yes    Comment: rare, none with pregnancy   Drug use: No   Sexual activity: Not Currently    Birth control/protection: Pill    Comment: on pill when found out pregnant  Other Topics Concern   Not on file  Social History Narrative   Not on file   Social Drivers of Health   Financial Resource Strain: Not on file  Food Insecurity: Not on file  Transportation Needs: Not on file  Physical Activity: Not on file  Stress: Not on file  Social Connections: Not on file   Outpatient Encounter Medications as of 02/02/2024  Medication Sig   atorvastatin  (LIPITOR) 10 MG tablet Take 1 tablet (10 mg total) by mouth daily.   Cholecalciferol (VITAMIN D ) 50 MCG (2000 UT) tablet Take 2,000 Units by mouth daily.   Continuous Glucose Sensor  (FREESTYLE LIBRE 3 SENSOR) MISC Place 1 sensor on the skin every 14 days. Use to check glucose continuously   glipiZIDE  (GLUCOTROL  XL) 5 MG 24 hr tablet Take 1 tablet (5 mg total) by mouth daily with breakfast.   glucose blood (ONETOUCH VERIO) test strip Use as instructed   insulin  aspart (NOVOLOG  FLEXPEN) 100 UNIT/ML FlexPen Inject 6-12 Units into the skin 3 (three) times daily with meals.   Insulin  Pen Needle (B-D ULTRAFINE III SHORT PEN) 31G X 8 MM MISC Use with insulin  qid. E11.65   levonorgestrel (MIRENA, 52 MG,) 20 MCG/24HR IUD Mirena 20 mcg/24 hours (7 yrs) 52 mg intrauterine device   lisinopril  (ZESTRIL ) 10 MG tablet TAKE 1 TABLET BY MOUTH EVERY DAY   sitaGLIPtin -metformin  (JANUMET ) 50-1000 MG tablet Take 1 tablet by mouth 2 (two) times daily.   vitamin B-12 (CYANOCOBALAMIN) 100 MCG tablet Take 100 mcg by mouth daily.   vitamin C (ASCORBIC ACID) 500 MG tablet Take 500 mg by mouth daily.   Vitamin D , Ergocalciferol , (DRISDOL ) 1.25 MG (50000 UNIT) CAPS capsule Take 50,000 Units by mouth once a week.   zinc gluconate 50 MG tablet Take 50 mg by mouth daily.   [DISCONTINUED] insulin  glargine-yfgn (SEMGLEE ) 100 UNIT/ML Pen Inject 30 Units into the skin at  bedtime.   insulin  glargine-yfgn (SEMGLEE ) 100 UNIT/ML Pen Inject 80 Units into the skin at bedtime.   [DISCONTINUED] injection device for insulin  (CEQUR SIMPLICITY 2U) DEVI Change every 3-4 days as directed. (Patient not taking: Reported on 02/02/2024)   [DISCONTINUED] Injection Device for Insulin  (CEQUR SIMPLICITY INSERTER) MISC Use as direct to insert Cequr device (Patient not taking: Reported on 02/02/2024)   No facility-administered encounter medications on file as of 02/02/2024.    ALLERGIES: No Known Allergies  VACCINATION STATUS: Immunization History  Administered Date(s) Administered   Tdap 01/22/2015    Diabetes She presents for her follow-up diabetic visit. She has type 2 diabetes mellitus. Onset time: She was diagnosed at  approximate age of 53 years. Her disease course has been worsening. There are no hypoglycemic associated symptoms. Pertinent negatives for hypoglycemia include no confusion, headaches, pallor or seizures. Associated symptoms include blurred vision, fatigue, polydipsia, polyuria and weight loss. Pertinent negatives for diabetes include no chest pain and no polyphagia. There are no hypoglycemic complications. Symptoms are worsening. There are no diabetic complications. Risk factors for coronary artery disease include dyslipidemia, diabetes mellitus, family history, sedentary lifestyle, obesity and hypertension. Current diabetic treatment includes oral agent (triple therapy) and intensive insulin  program. She is compliant with treatment none of the time. Her weight is decreasing steadily. She is following a generally unhealthy diet. When asked about meal planning, she reported none. She has had a previous visit with a dietitian. She participates in exercise intermittently. Her home blood glucose trend is increasing rapidly. Her breakfast blood glucose range is generally >200 mg/dl. Her lunch blood glucose range is generally >200 mg/dl. Her dinner blood glucose range is generally >200 mg/dl. Her bedtime blood glucose range is generally >200 mg/dl. Her overall blood glucose range is >200 mg/dl. (She presents today with her CGM showing gross hyperglycemia overall.  Her POCT A1c today is >15%, increasing from last visit of 13.6%.  She still forgets to take her meal time insulin , is compliant with night time insulin  and pills mostly.  Analysis of her CGM shows TIR 0%, TAR 100% (100% in level 2 hyperglycemia range), TBR 0% with a GMI of 12.8%.  She notes her insurance did not provide optimal coverage for Cequr device.  She has reduced her soda intake and is drinking more water, but also flavors her water with crystal light packets.) An ACE inhibitor/angiotensin II receptor blocker is being taken. She does not see a  podiatrist.Eye exam is current.  Hypertension This is a chronic problem. The current episode started more than 1 year ago. The problem has been resolved since onset. The problem is controlled. Associated symptoms include blurred vision. Pertinent negatives include no chest pain, headaches, palpitations or shortness of breath. There are no associated agents to hypertension. Risk factors for coronary artery disease include dyslipidemia, diabetes mellitus and sedentary lifestyle. Past treatments include ACE inhibitors. The current treatment provides moderate improvement. There are no compliance problems.   Hyperlipidemia This is a chronic problem. The current episode started more than 1 year ago. The problem is controlled. Recent lipid tests were reviewed and are normal. Exacerbating diseases include diabetes and obesity. There are no known factors aggravating her hyperlipidemia. Pertinent negatives include no chest pain or shortness of breath. Current antihyperlipidemic treatment includes statins. The current treatment provides moderate improvement of lipids. There are no compliance problems.  Risk factors for coronary artery disease include diabetes mellitus, hypertension, dyslipidemia and obesity.    Review of systems  Constitutional: + decreasing body  weight,  current Body mass index is 20.81 kg/m. , no fatigue, no subjective hyperthermia, no subjective hypothermia, + polyuria and polydipsia Eyes: no blurry vision, no xerophthalmia ENT: no sore throat, no nodules palpated in throat, no dysphagia/odynophagia, no hoarseness Cardiovascular: no chest pain, no shortness of breath, no palpitations, no leg swelling Respiratory: no cough, no shortness of breath Gastrointestinal: no nausea/vomiting/diarrhea Musculoskeletal: no muscle/joint aches Skin: no rashes, no hyperemia Neurological: no tremors, no numbness, no tingling, no dizziness Psychiatric: no depression, no anxiety    Objective:    BP  104/72 (BP Location: Left Arm, Patient Position: Sitting, Cuff Size: Large)   Pulse 94   Ht 5' 7.74" (1.721 m)   Wt 135 lb 12.8 oz (61.6 kg)   BMI 20.81 kg/m   Wt Readings from Last 3 Encounters:  02/02/24 135 lb 12.8 oz (61.6 kg)  10/29/23 141 lb (64 kg)  07/23/23 147 lb 6.4 oz (66.9 kg)    BP Readings from Last 3 Encounters:  02/02/24 104/72  10/29/23 110/80  07/23/23 108/72     Physical Exam- Limited  Constitutional:  Body mass index is 20.81 kg/m. , not in acute distress, normal state of mind Eyes:  EOMI, no exophthalmos Musculoskeletal: no gross deformities, strength intact in all four extremities, no gross restriction of joint movements Skin:  no rashes, no hyperemia Neurological: no tremor with outstretched hands   Diabetic Foot Exam - Simple   No data filed     CMP ( most recent) CMP     Component Value Date/Time   NA 131 (L) 07/16/2023 1314   K 4.5 07/16/2023 1314   CL 94 (L) 07/16/2023 1314   CO2 20 07/16/2023 1314   GLUCOSE 395 (H) 07/16/2023 1314   GLUCOSE 79 01/14/2015 1720   BUN 10 07/16/2023 1314   CREATININE 0.85 07/16/2023 1314   CALCIUM  9.4 07/16/2023 1314   PROT 7.1 07/16/2023 1314   ALBUMIN 4.5 07/16/2023 1314   AST 10 07/16/2023 1314   ALT 9 07/16/2023 1314   ALKPHOS 36 (L) 07/16/2023 1314   BILITOT 1.1 07/16/2023 1314   GFRNONAA 98 08/01/2020 1024   GFRAA 113 08/01/2020 1024    Diabetic Labs (most recent): Lab Results  Component Value Date   HGBA1C 15.0 (A) 02/02/2024   HGBA1C 13.6 (A) 10/29/2023   HGBA1C 13.1 (A) 07/23/2023   MICROALBUR 30mg /L 04/19/2023   MICROALBUR 10 12/23/2021   MICROALBUR 30 11/12/2020     Lipid Panel ( most recent) Lipid Panel     Component Value Date/Time   CHOL 178 07/16/2023 1314   TRIG 204 (H) 07/16/2023 1314   HDL 50 07/16/2023 1314   CHOLHDL 3.6 07/16/2023 1314   LDLCALC 93 07/16/2023 1314     Assessment & Plan:   1) Uncontrolled type 2 diabetes mellitus with hyperglycemia (HCC)  -  Patient has currently uncontrolled symptomatic type 2 DM since 53 years of age.  She presents today with her CGM showing gross hyperglycemia overall.  Her POCT A1c today is >15%, increasing from last visit of 13.6%.  She still forgets to take her meal time insulin , is compliant with night time insulin  and pills mostly.  Analysis of her CGM shows TIR 0%, TAR 100% (100% in level 2 hyperglycemia range), TBR 0% with a GMI of 12.8%.  She notes her insurance did not provide optimal coverage for Cequr device.  She has reduced her soda intake and is drinking more water, but also flavors her water with crystal light  packets.   -Recent labs reviewed.   - She does not report any gross complications  from her diabetes, however,  Nayleen Cowing remains at a high risk for more acute and chronic complications which include CAD, CVA, CKD, retinopathy, and neuropathy. These are all discussed in detail with the patient.  The following Lifestyle Medicine recommendations according to American College of Lifestyle Medicine [facility]) were discussed and offered to patient and she agrees to start the journey:  A. Whole Foods, Plant-based plate comprising of fruits and vegetables, plant-based proteins, whole-grain carbohydrates was discussed in detail with the patient.   A list for source of those nutrients were also provided to the patient.  Patient will use only water or unsweetened tea for hydration. B.  The need to stay away from risky substances including alcohol, smoking; obtaining 7 to 9 hours of restorative sleep, at least 150 minutes of moderate intensity exercise weekly, the importance of healthy social connections,  and stress reduction techniques were discussed. C.  A full color page of  Calorie density of various food groups per pound showing examples of each food groups was provided to the patient.  - Nutritional counseling repeated at each appointment due to patients tendency to fall back in to old habits.  - The  patient admits there is a room for improvement in their diet and drink choices. -  Suggestion is made for the patient to avoid simple carbohydrates from their diet including Cakes, Sweet Desserts / Pastries, Ice Cream, Soda (diet and regular), Sweet Tea, Candies, Chips, Cookies, Sweet Pastries, Store Bought Juices, Alcohol in Excess of 1-2 drinks a day, Artificial Sweeteners, Coffee Creamer, and "Sugar-free" Products. This will help patient to have stable blood glucose profile and potentially avoid unintended weight gain.   - I encouraged the patient to switch to unprocessed or minimally processed complex starch and increased protein intake (animal or plant source), fruits, and vegetables.   - Patient is advised to stick to a routine mealtimes to eat 3 meals a day and avoid unnecessary snacks (to snack only to correct hypoglycemia).  - I have approached her with the following individualized plan to manage diabetes and patient agrees:   -She has a tendency to disengage from her healthcare.  She notes she has not been taking her medications consistently.  She did ask her insurance about pumps, and Omnipod 5 was not covered.  -She is advised to increase her Semglee  to 80 SQ nightly, continue Novolog  6-12 units TID with meals if glucose is above 90 and eating (Specific instructions on how to titrate insulin  dosage based on glucose readings given to patient in writing)----BUT TO BE MORE CONSISTENT WITH TAKING IT, Glipizide  5 mg XL daily with breakfast and Janumet  50/1000 mg po twice daily with meals.    Unfortunately, insurance did not provide optimal coverage for Cequr device.  I encouraged her to reach out and see if Omnipod coverage has changed now that it has been FDA approved for management in type 2 diabetes.  -She is encouraged to continue using her CGM to monitor blood glucose 4 times daily, before meals and before bed, and to call the clinic if she has readings less than 70 or greater than 300 for  3 tests in a row.    - Patient specific target  A1c;  LDL, HDL, Triglycerides,  were discussed in detail.  2) BP/HTN:  Her blood pressure is controlled to target.  She is advised to continue current medications including Lisinopril  10  mg p.o. daily.    3) Lipids/HPL:  Her most recent lipid panel from 07/03/22 shows controlled LDL at 99.  She is advised to continue Lipitor 10 mg po daily at bedtime.  Side effects and precautions discussed with her.    4)  Weight/Diet:  Her Body mass index is 20.81 kg/m. - she is a candidate for modest weight loss.  CDE Consult has been  re-initiated , exercise, and detailed carbohydrates information provided.  5) Vitamin D  deficiency:  Her last vitamin D  level on 12/04/21 was 27.3.  She has completed replenishment with Ergocalciferol .  She is taking Vitamin D3 2000 units daily as maintenance dose.     6) Chronic Care/Health Maintenance: -she is on ACEI and Statin medications and is encouraged to continue to follow up with Ophthalmology, Dentist,  Podiatrist at least yearly or according to recommendations, and advised to stay away from smoking. I have recommended yearly flu vaccine and pneumonia vaccination at least every 5 years; moderate intensity exercise for up to 150 minutes weekly; and  sleep for at least 7 hours a day.   - I advised patient to maintain close follow up with Vasireddy, Sabitha, MD for primary care needs.     I spent  41  minutes in the care of the patient today including review of labs from CMP, Lipids, Thyroid Function, Hematology (current and previous including abstractions from other facilities); face-to-face time discussing  her blood glucose readings/logs, discussing hypoglycemia and hyperglycemia episodes and symptoms, medications doses, her options of short and long term treatment based on the latest standards of care / guidelines;  discussion about incorporating lifestyle medicine;  and documenting the encounter. Risk reduction  counseling performed per USPSTF guidelines to reduce obesity and cardiovascular risk factors.     Please refer to Patient Instructions for Blood Glucose Monitoring and Insulin /Medications Dosing Guide"  in media tab for additional information. Please  also refer to " Patient Self Inventory" in the Media  tab for reviewed elements of pertinent patient history.  Jesslyn Moro participated in the discussions, expressed understanding, and voiced agreement with the above plans.  All questions were answered to her satisfaction. she is encouraged to contact clinic should she have any questions or concerns prior to her return visit.    Follow up plan: - Return in about 3 months (around 05/04/2024) for Diabetes F/U with A1c in office, No previsit labs, Bring meter and logs.  Hulon Magic, [facility] [address] [place], [place] [address] Phone: [phone] Fax: [phone]  02/02/2024, 10:39 AM

## 2024-02-15 ENCOUNTER — Encounter: Payer: Self-pay | Admitting: Nurse Practitioner

## 2024-02-23 ENCOUNTER — Other Ambulatory Visit: Payer: Self-pay | Admitting: Nurse Practitioner

## 2024-02-23 MED ORDER — CEQUR SIMPLICITY INSERTER MISC: 0 refills | Status: AC

## 2024-02-23 MED ORDER — CEQUR SIMPLICITY 2U DEVI: 11 refills | Status: DC

## 2024-02-24 ENCOUNTER — Other Ambulatory Visit: Payer: Self-pay | Admitting: Nurse Practitioner

## 2024-02-24 ENCOUNTER — Encounter: Payer: Self-pay | Admitting: Nurse Practitioner

## 2024-02-24 MED ORDER — INSULIN ASPART 100 UNIT/ML IJ SOLN: INTRAMUSCULAR | 3 refills | Status: DC

## 2024-04-26 ENCOUNTER — Other Ambulatory Visit: Payer: Self-pay | Admitting: Nurse Practitioner

## 2024-05-10 ENCOUNTER — Ambulatory Visit: Admitting: Nurse Practitioner

## 2024-05-10 ENCOUNTER — Encounter: Payer: Self-pay | Admitting: Nurse Practitioner

## 2024-05-10 ENCOUNTER — Other Ambulatory Visit: Payer: Self-pay | Admitting: Nurse Practitioner

## 2024-05-10 VITALS — BP 110/70 | HR 103 | Ht 64.5 in | Wt 152.8 lb

## 2024-05-10 DIAGNOSIS — I10 Essential (primary) hypertension: Secondary | ICD-10-CM

## 2024-05-10 DIAGNOSIS — E782 Mixed hyperlipidemia: Secondary | ICD-10-CM

## 2024-05-10 DIAGNOSIS — E559 Vitamin D deficiency, unspecified: Secondary | ICD-10-CM | POA: Diagnosis not present

## 2024-05-10 DIAGNOSIS — E1165 Type 2 diabetes mellitus with hyperglycemia: Secondary | ICD-10-CM

## 2024-05-10 DIAGNOSIS — Z794 Long term (current) use of insulin: Secondary | ICD-10-CM

## 2024-05-10 DIAGNOSIS — Z7984 Long term (current) use of oral hypoglycemic drugs: Secondary | ICD-10-CM

## 2024-05-10 LAB — POCT GLYCOSYLATED HEMOGLOBIN (HGB A1C): Hemoglobin A1C: 9 % — AB (ref 4.0–5.6)

## 2024-05-10 MED ORDER — PEN NEEDLES 31G X 5 MM MISC
3 refills | Status: AC
Start: 2024-05-10 — End: ?

## 2024-05-10 MED ORDER — CEQUR SIMPLICITY 2U DEVI: 11 refills | Status: DC

## 2024-05-10 MED ORDER — INSULIN GLARGINE-YFGN 100 UNIT/ML ~~LOC~~ SOPN: 40.0000 [IU] | PEN_INJECTOR | Freq: Every day | SUBCUTANEOUS | 3 refills | Status: AC

## 2024-05-10 MED ORDER — FREESTYLE LIBRE 3 PLUS SENSOR MISC: 3 refills | Status: AC

## 2024-05-10 MED ORDER — JANUMET 50-1000 MG PO TABS: 1.0000 | ORAL_TABLET | Freq: Two times a day (BID) | ORAL | 3 refills | Status: AC

## 2024-05-10 NOTE — Telephone Encounter (Signed)
 I think she has been paying for this out of pocket? Maybe with a copay card?  I can't remember the specifics.

## 2024-05-10 NOTE — Progress Notes (Signed)
 05/10/2024           Endocrinology follow-up note  Subjective:    Patient ID: [name], female    DOB: 10-20-1970.  she is being seen in follow-up for management of currently uncontrolled symptomatic diabetes requested by  Luetta Chew, MD.   Past Medical History:  Diagnosis Date   Diabetes mellitus without complication (HCC)    Infection    bladder infection   Ovarian cyst    Vaginal Pap smear, abnormal    laser removal of abn cells; normal since   Varicose veins during pregnancy, antepartum    Past Surgical History:  Procedure Laterality Date   CERVICAL CERCLAGE N/A 10/12/2014   Procedure: CERCLAGE CERVICAL;  Surgeon: Alm JAYSON Cook, MD;  Location: [facility];  Service: Gynecology;  Laterality: N/A;   OOPHORECTOMY     OVARIAN CYST REMOVAL     Social History   Socioeconomic History   Marital status: Married    Spouse name: Not on file   Number of children: Not on file   Years of education: Not on file   Highest education level: Not on file  Occupational History   Not on file  Tobacco Use   Smoking status: Never   Smokeless tobacco: Never  Vaping Use   Vaping status: Never Used  Substance and Sexual Activity   Alcohol use: Yes    Comment: rare, none with pregnancy   Drug use: No   Sexual activity: Not Currently    Birth control/protection: Pill    Comment: on pill when found out pregnant  Other Topics Concern   Not on file  Social History Narrative   Not on file   Social Drivers of Health   Financial Resource Strain: Not on file  Food Insecurity: Not on file  Transportation Needs: Not on file  Physical Activity: Not on file  Stress: Not on file  Social Connections: Not on file   Outpatient Encounter Medications as of 05/10/2024  Medication Sig   atorvastatin  (LIPITOR) 10 MG tablet Take 1 tablet (10 mg total) by mouth daily.   Cholecalciferol (VITAMIN D ) 50 MCG (2000 UT) tablet Take 2,000 Units by mouth daily.   Continuous Glucose Sensor  (FREESTYLE LIBRE 3 PLUS SENSOR) MISC Change sensor every 15 days.   glucose blood (ONETOUCH VERIO) test strip Use as instructed   Injection Device for Insulin  (CEQUR SIMPLICITY INSERTER) MISC Use to apply Cequr device   insulin  aspart (NOVOLOG ) 100 UNIT/ML injection Use with Cequr device for TDD around 40 units daily   Insulin  Pen Needle (PEN NEEDLES) 31G X 5 MM MISC Use to inject insulin  once daily   levonorgestrel (MIRENA, 52 MG,) 20 MCG/24HR IUD Mirena 20 mcg/24 hours (7 yrs) 52 mg intrauterine device   lisinopril  (ZESTRIL ) 10 MG tablet TAKE 1 TABLET BY MOUTH EVERY DAY   vitamin B-12 (CYANOCOBALAMIN) 100 MCG tablet Take 100 mcg by mouth daily.   vitamin C (ASCORBIC ACID) 500 MG tablet Take 500 mg by mouth daily.   Vitamin D , Ergocalciferol , (DRISDOL ) 1.25 MG (50000 UNIT) CAPS capsule Take 50,000 Units by mouth once a week.   zinc gluconate 50 MG tablet Take 50 mg by mouth daily.   [DISCONTINUED] Continuous Glucose Sensor (FREESTYLE LIBRE 3 SENSOR) MISC Place 1 sensor on the skin every 14 days. Use to check glucose continuously   [DISCONTINUED] injection device for insulin  (CEQUR SIMPLICITY 2U) DEVI Change device every 72 hours   [DISCONTINUED] insulin  glargine-yfgn (SEMGLEE ) 100 UNIT/ML Pen INJECT  80 UNITS INTO THE SKIN AT BEDTIME. (Patient taking differently: Inject 35 Units into the skin at bedtime.)   [DISCONTINUED] Insulin  Pen Needle (B-D ULTRAFINE III SHORT PEN) 31G X 8 MM MISC Use with insulin  qid. E11.65   [DISCONTINUED] sitaGLIPtin -metformin  (JANUMET ) 50-1000 MG tablet Take 1 tablet by mouth 2 (two) times daily.   glipiZIDE  (GLUCOTROL  XL) 5 MG 24 hr tablet Take 1 tablet (5 mg total) by mouth daily with breakfast. (Patient not taking: Reported on 05/10/2024)   injection device for insulin  (CEQUR SIMPLICITY 2U) DEVI Change device every 72 hours   insulin  glargine-yfgn (SEMGLEE ) 100 UNIT/ML Pen Inject 40 Units into the skin at bedtime.   sitaGLIPtin -metformin  (JANUMET ) 50-1000 MG tablet  Take 1 tablet by mouth 2 (two) times daily.   No facility-administered encounter medications on file as of 05/10/2024.    ALLERGIES: No Known Allergies  VACCINATION STATUS: Immunization History  Administered Date(s) Administered   Tdap 01/22/2015    Diabetes She presents for her follow-up diabetic visit. She has type 2 diabetes mellitus. Onset time: She was diagnosed at approximate age of 53 years. Her disease course has been improving. There are no hypoglycemic associated symptoms. Pertinent negatives for hypoglycemia include no confusion, headaches, pallor or seizures. Associated symptoms include blurred vision, fatigue and polydipsia. Pertinent negatives for diabetes include no chest pain, no polyphagia, no polyuria and no weight loss. There are no hypoglycemic complications. Symptoms are improving. There are no diabetic complications. Risk factors for coronary artery disease include dyslipidemia, diabetes mellitus, family history, sedentary lifestyle, obesity and hypertension. Current diabetic treatment includes oral agent (triple therapy) and intensive insulin  program. She is compliant with treatment none of the time. Her weight is decreasing steadily. She is following a generally unhealthy diet. When asked about meal planning, she reported none. She has had a previous visit with a dietitian. She participates in exercise intermittently. Her home blood glucose trend is decreasing steadily. Her overall blood glucose range is >200 mg/dl. (She presents today with her CGM showing drastically improved glycemic profile overall.  Her POCT A1c today is 9%, improving from last visit of 15%.  Analysis of her CGM shows TIR 20%, TAR 80%, TBR 0% with a GMI of 8.6%.  She did call between visits for low sugars and her regimen was adjusted.  She is loving the Cequr, has helped her consistently bolus prior to eating.  Sometimes she forgets to do it before her meal and waits until the spike to dose, causing a steep  drop right after.) An ACE inhibitor/angiotensin II receptor blocker is being taken. She does not see a podiatrist.Eye exam is current.  Hypertension This is a chronic problem. The current episode started more than 1 year ago. The problem has been resolved since onset. The problem is controlled. Associated symptoms include blurred vision. Pertinent negatives include no chest pain, headaches, palpitations or shortness of breath. There are no associated agents to hypertension. Risk factors for coronary artery disease include dyslipidemia, diabetes mellitus and sedentary lifestyle. Past treatments include ACE inhibitors. The current treatment provides moderate improvement. There are no compliance problems.   Hyperlipidemia This is a chronic problem. The current episode started more than 1 year ago. The problem is controlled. Recent lipid tests were reviewed and are normal. Exacerbating diseases include diabetes and obesity. There are no known factors aggravating her hyperlipidemia. Pertinent negatives include no chest pain or shortness of breath. Current antihyperlipidemic treatment includes statins. The current treatment provides moderate improvement of lipids. There are no compliance  problems.  Risk factors for coronary artery disease include diabetes mellitus, hypertension, dyslipidemia and obesity.    Review of systems  Constitutional: + increasing body weight,  current Body mass index is 25.82 kg/m. , no fatigue, no subjective hyperthermia, no subjective hypothermia, + polyuria and polydipsia-improving Eyes: no blurry vision, no xerophthalmia ENT: no sore throat, no nodules palpated in throat, no dysphagia/odynophagia, no hoarseness Cardiovascular: no chest pain, no shortness of breath, no palpitations, no leg swelling Respiratory: no cough, no shortness of breath Gastrointestinal: no nausea/vomiting/diarrhea Musculoskeletal: no muscle/joint aches Skin: no rashes, no hyperemia Neurological: no  tremors, no numbness, no tingling, no dizziness Psychiatric: no depression, no anxiety    Objective:    BP 110/70 (BP Location: Right Arm, Patient Position: Sitting, Cuff Size: Large)   Pulse (!) 103   Ht 5' 4.5 (1.638 m)   Wt 152 lb 12.8 oz (69.3 kg)   BMI 25.82 kg/m   Wt Readings from Last 3 Encounters:  05/10/24 152 lb 12.8 oz (69.3 kg)  02/02/24 135 lb 12.8 oz (61.6 kg)  10/29/23 141 lb (64 kg)    BP Readings from Last 3 Encounters:  05/10/24 110/70  02/02/24 104/72  10/29/23 110/80      Physical Exam- Limited  Constitutional:  Body mass index is 25.82 kg/m. , not in acute distress, normal state of mind Eyes:  EOMI, no exophthalmos Musculoskeletal: no gross deformities, strength intact in all four extremities, no gross restriction of joint movements Skin:  no rashes, no hyperemia Neurological: no tremor with outstretched hands   Diabetic Foot Exam - Simple   Simple Foot Form Diabetic Foot exam was performed with the following findings: Yes 05/10/2024  9:43 AM  Visual Inspection No deformities, no ulcerations, no other skin breakdown bilaterally: Yes Sensation Testing Intact to touch and monofilament testing bilaterally: Yes Pulse Check Posterior Tibialis and Dorsalis pulse intact bilaterally: Yes Comments Redness notes to right foot primarily- blanches     CMP ( most recent) CMP     Component Value Date/Time   NA 131 (L) 07/16/2023 1314   K 4.5 07/16/2023 1314   CL 94 (L) 07/16/2023 1314   CO2 20 07/16/2023 1314   GLUCOSE 395 (H) 07/16/2023 1314   GLUCOSE 79 01/14/2015 1720   BUN 10 07/16/2023 1314   CREATININE 0.85 07/16/2023 1314   CALCIUM  9.4 07/16/2023 1314   PROT 7.1 07/16/2023 1314   ALBUMIN 4.5 07/16/2023 1314   AST 10 07/16/2023 1314   ALT 9 07/16/2023 1314   ALKPHOS 36 (L) 07/16/2023 1314   BILITOT 1.1 07/16/2023 1314   GFRNONAA 98 08/01/2020 1024   GFRAA 113 08/01/2020 1024    Diabetic Labs (most recent): Lab Results  Component  Value Date   HGBA1C 9.0 (A) 05/10/2024   HGBA1C 15.0 (A) 02/02/2024   HGBA1C 13.6 (A) 10/29/2023   MICROALBUR 30mg /L 04/19/2023   MICROALBUR 10 12/23/2021   MICROALBUR 30 11/12/2020     Lipid Panel ( most recent) Lipid Panel     Component Value Date/Time   CHOL 178 07/16/2023 1314   TRIG 204 (H) 07/16/2023 1314   HDL 50 07/16/2023 1314   CHOLHDL 3.6 07/16/2023 1314   LDLCALC 93 07/16/2023 1314     Assessment & Plan:   1) Uncontrolled type 2 diabetes mellitus with hyperglycemia (HCC)  - Patient has currently uncontrolled symptomatic type 2 DM since 53 years of age.  She presents today with her CGM showing drastically improved glycemic profile overall.  Her POCT  A1c today is 9%, improving from last visit of 15%.  Analysis of her CGM shows TIR 20%, TAR 80%, TBR 0% with a GMI of 8.6%.  She did call between visits for low sugars and her regimen was adjusted.  She is loving the Cequr, has helped her consistently bolus prior to eating.  Sometimes she forgets to do it before her meal and waits until the spike to dose, causing a steep drop right after.   -Recent labs reviewed.   - She does not report any gross complications  from her diabetes, however,  [name] remains at a high risk for more acute and chronic complications which include CAD, CVA, CKD, retinopathy, and neuropathy. These are all discussed in detail with the patient.  The following Lifestyle Medicine recommendations according to American College of Lifestyle Medicine [facility]) were discussed and offered to patient and she agrees to start the journey:  A. Whole Foods, Plant-based plate comprising of fruits and vegetables, plant-based proteins, whole-grain carbohydrates was discussed in detail with the patient.   A list for source of those nutrients were also provided to the patient.  Patient will use only water or unsweetened tea for hydration. B.  The need to stay away from risky substances including alcohol, smoking;  obtaining 7 to 9 hours of restorative sleep, at least 150 minutes of moderate intensity exercise weekly, the importance of healthy social connections,  and stress reduction techniques were discussed. C.  A full color page of  Calorie density of various food groups per pound showing examples of each food groups was provided to the patient.  - Nutritional counseling repeated at each appointment due to patients tendency to fall back in to old habits.  - The patient admits there is a room for improvement in their diet and drink choices. -  Suggestion is made for the patient to avoid simple carbohydrates from their diet including Cakes, Sweet Desserts / Pastries, Ice Cream, Soda (diet and regular), Sweet Tea, Candies, Chips, Cookies, Sweet Pastries, Store Bought Juices, Alcohol in Excess of 1-2 drinks a day, Artificial Sweeteners, Coffee Creamer, and Sugar-free Products. This will help patient to have stable blood glucose profile and potentially avoid unintended weight gain.   - I encouraged the patient to switch to unprocessed or minimally processed complex starch and increased protein intake (animal or plant source), fruits, and vegetables.   - Patient is advised to stick to a routine mealtimes to eat 3 meals a day and avoid unnecessary snacks (to snack only to correct hypoglycemia).  - I have approached her with the following individualized plan to manage diabetes and patient agrees:   -She is advised to increase her Semglee  to 40 SQ nightly, continue Novolog  4-10 units TID with meals if glucose is above 90 and eating (Specific instructions on how to titrate insulin  dosage based on glucose readings given to patient in writing) via the Cequr.  She can stay off the Glipizide  for now.  Continue Janumet  50/1000 mg po twice daily.  -She is encouraged to continue using her CGM to monitor blood glucose 4 times daily, before meals and before bed, and to call the clinic if she has readings less than 70 or  greater than 300 for 3 tests in a row.    - Patient specific target  A1c;  LDL, HDL, Triglycerides,  were discussed in detail.  2) BP/HTN:  Her blood pressure is controlled to target.  She is advised to continue current medications including Lisinopril  10 mg  p.o. daily.    3) Lipids/HPL:  Her most recent lipid panel from 02/09/24 shows controlled LDL at 38.  She is advised to continue Lipitor 10 mg po daily at bedtime.  Side effects and precautions discussed with her.    4)  Weight/Diet:  Her Body mass index is 25.82 kg/m. - she is a candidate for modest weight loss.  CDE Consult has been  re-initiated , exercise, and detailed carbohydrates information provided.  5) Vitamin D  deficiency:  Her last vitamin D  level on 12/04/21 was 27.3.  She has completed replenishment with Ergocalciferol .  She is taking Vitamin D3 2000 units daily as maintenance dose.     6) Chronic Care/Health Maintenance: -she is on ACEI and Statin medications and is encouraged to continue to follow up with Ophthalmology, Dentist,  Podiatrist at least yearly or according to recommendations, and advised to stay away from smoking. I have recommended yearly flu vaccine and pneumonia vaccination at least every 5 years; moderate intensity exercise for up to 150 minutes weekly; and  sleep for at least 7 hours a day.   - I advised patient to maintain close follow up with Vasireddy, Sabitha, MD for primary care needs.     I spent  51  minutes in the care of the patient today including review of labs from CMP, Lipids, Thyroid Function, Hematology (current and previous including abstractions from other facilities); face-to-face time discussing  her blood glucose readings/logs, discussing hypoglycemia and hyperglycemia episodes and symptoms, medications doses, her options of short and long term treatment based on the latest standards of care / guidelines;  discussion about incorporating lifestyle medicine;  and documenting the  encounter. Risk reduction counseling performed per USPSTF guidelines to reduce obesity and cardiovascular risk factors.     Please refer to Patient Instructions for Blood Glucose Monitoring and Insulin /Medications Dosing Guide  in media tab for additional information. Please  also refer to  Patient Self Inventory in the Media  tab for reviewed elements of pertinent patient history.  [name] participated in the discussions, expressed understanding, and voiced agreement with the above plans.  All questions were answered to her satisfaction. she is encouraged to contact clinic should she have any questions or concerns prior to her return visit.    Follow up plan: - Return in about 3 months (around 08/10/2024) for Diabetes F/U with A1c in office, No previsit labs, Bring meter and logs.  Benton Rio, [facility] [address] [place], [place] [address] Phone: [phone] Fax: [phone]  05/10/2024, 9:55 AM

## 2024-06-09 NOTE — Telephone Encounter (Signed)
 I think so, perhaps with a copay card or something

## 2024-07-23 ENCOUNTER — Other Ambulatory Visit: Payer: Self-pay | Admitting: Nurse Practitioner

## 2024-08-16 ENCOUNTER — Ambulatory Visit: Admitting: Nurse Practitioner

## 2024-08-16 ENCOUNTER — Encounter: Payer: Self-pay | Admitting: Nurse Practitioner

## 2024-08-16 VITALS — BP 106/70 | HR 104 | Ht 64.5 in | Wt 146.4 lb

## 2024-08-16 DIAGNOSIS — I10 Essential (primary) hypertension: Secondary | ICD-10-CM | POA: Diagnosis not present

## 2024-08-16 DIAGNOSIS — Z794 Long term (current) use of insulin: Secondary | ICD-10-CM

## 2024-08-16 DIAGNOSIS — E559 Vitamin D deficiency, unspecified: Secondary | ICD-10-CM | POA: Diagnosis not present

## 2024-08-16 DIAGNOSIS — E782 Mixed hyperlipidemia: Secondary | ICD-10-CM | POA: Diagnosis not present

## 2024-08-16 DIAGNOSIS — E1165 Type 2 diabetes mellitus with hyperglycemia: Secondary | ICD-10-CM

## 2024-08-16 DIAGNOSIS — Z7984 Long term (current) use of oral hypoglycemic drugs: Secondary | ICD-10-CM

## 2024-08-16 MED ORDER — CEQUR SIMPLICITY 2U DEVI: 11 refills | Status: AC

## 2024-08-16 MED ORDER — INSULIN ASPART 100 UNIT/ML IJ SOLN: INTRAMUSCULAR | 3 refills | Status: AC

## 2024-08-16 NOTE — Progress Notes (Signed)
 08/16/2024           Endocrinology follow-up note  Subjective:    Patient ID: [name], female    DOB: 01-Feb-1971.  she is being seen in follow-up for management of currently uncontrolled symptomatic diabetes requested by  Luetta Chew, MD.   Past Medical History:  Diagnosis Date   Diabetes mellitus without complication (HCC)    Infection    bladder infection   Ovarian cyst    Vaginal Pap smear, abnormal    laser removal of abn cells; normal since   Varicose veins during pregnancy, antepartum    Past Surgical History:  Procedure Laterality Date   CERVICAL CERCLAGE N/A 10/12/2014   Procedure: CERCLAGE CERVICAL;  Surgeon: Alm JAYSON Cook, MD;  Location: [facility];  Service: Gynecology;  Laterality: N/A;   OOPHORECTOMY     OVARIAN CYST REMOVAL     Social History   Socioeconomic History   Marital status: Married    Spouse name: Not on file   Number of children: Not on file   Years of education: Not on file   Highest education level: Not on file  Occupational History   Not on file  Tobacco Use   Smoking status: Never   Smokeless tobacco: Never  Vaping Use   Vaping status: Never Used  Substance and Sexual Activity   Alcohol use: Yes    Comment: rare, none with pregnancy   Drug use: No   Sexual activity: Not Currently    Birth control/protection: Pill    Comment: on pill when found out pregnant  Other Topics Concern   Not on file  Social History Narrative   Not on file   Social Drivers of Health   Financial Resource Strain: Not on file  Food Insecurity: Not on file  Transportation Needs: Not on file  Physical Activity: Not on file  Stress: Not on file  Social Connections: Not on file   Outpatient Encounter Medications as of 08/16/2024  Medication Sig   atorvastatin  (LIPITOR) 10 MG tablet TAKE 1 TABLET BY MOUTH EVERY DAY   Cholecalciferol (VITAMIN D ) 50 MCG (2000 UT) tablet Take 2,000 Units by mouth daily.   Continuous Glucose Sensor (FREESTYLE  LIBRE 3 PLUS SENSOR) MISC Change sensor every 15 days.   glucose blood (ONETOUCH VERIO) test strip Use as instructed   Injection Device for Insulin  (CEQUR SIMPLICITY INSERTER) MISC Use to apply Cequr device   insulin  glargine-yfgn (SEMGLEE ) 100 UNIT/ML Pen Inject 40 Units into the skin at bedtime.   Insulin  Pen Needle (PEN NEEDLES) 31G X 5 MM MISC Use to inject insulin  once daily   levonorgestrel (MIRENA, 52 MG,) 20 MCG/24HR IUD Mirena 20 mcg/24 hours (7 yrs) 52 mg intrauterine device   lisinopril  (ZESTRIL ) 10 MG tablet TAKE 1 TABLET BY MOUTH EVERY DAY   sitaGLIPtin -metformin  (JANUMET ) 50-1000 MG tablet Take 1 tablet by mouth 2 (two) times daily.   vitamin B-12 (CYANOCOBALAMIN) 100 MCG tablet Take 100 mcg by mouth daily.   vitamin C (ASCORBIC ACID) 500 MG tablet Take 500 mg by mouth daily.   Vitamin D , Ergocalciferol , (DRISDOL ) 1.25 MG (50000 UNIT) CAPS capsule Take 50,000 Units by mouth once a week.   zinc gluconate 50 MG tablet Take 50 mg by mouth daily.   [DISCONTINUED] injection device for insulin  (CEQUR SIMPLICITY 2U) DEVI Change device every 72 hours   [DISCONTINUED] insulin  aspart (NOVOLOG ) 100 UNIT/ML injection Use with Cequr device for TDD around 40 units daily (Patient taking differently: Inject  4-10 Units into the skin. Use with Cequr device for TDD around 40 units daily)   injection device for insulin  (CEQUR SIMPLICITY 2U) DEVI Change device every 72 hours   insulin  aspart (NOVOLOG ) 100 UNIT/ML injection Use with Cequr device for TDD around 40 units daily   [DISCONTINUED] glipiZIDE  (GLUCOTROL  XL) 5 MG 24 hr tablet Take 1 tablet (5 mg total) by mouth daily with breakfast. (Patient not taking: Reported on 08/16/2024)   No facility-administered encounter medications on file as of 08/16/2024.    ALLERGIES: No Known Allergies  VACCINATION STATUS: Immunization History  Administered Date(s) Administered   Tdap 01/22/2015    Diabetes She presents for her follow-up diabetic visit.  She has type 2 diabetes mellitus. Onset time: She was diagnosed at approximate age of 53 years. Her disease course has been worsening. There are no hypoglycemic associated symptoms. Pertinent negatives for hypoglycemia include no confusion, headaches, pallor or seizures. Associated symptoms include blurred vision, fatigue and polydipsia. Pertinent negatives for diabetes include no chest pain, no polyphagia, no polyuria and no weight loss. There are no hypoglycemic complications. Symptoms are improving. There are no diabetic complications. Risk factors for coronary artery disease include dyslipidemia, diabetes mellitus, family history, sedentary lifestyle, obesity and hypertension. Current diabetic treatment includes oral agent (triple therapy) and intensive insulin  program. She is compliant with treatment none of the time. Her weight is decreasing steadily. She is following a generally unhealthy diet. When asked about meal planning, she reported none. She has had a previous visit with a dietitian. She participates in exercise intermittently. Her home blood glucose trend is increasing rapidly. Her breakfast blood glucose range is generally >200 mg/dl. Her lunch blood glucose range is generally >200 mg/dl. Her dinner blood glucose range is generally >200 mg/dl. Her bedtime blood glucose range is generally >200 mg/dl. Her overall blood glucose range is >200 mg/dl. (She presents today with her CGM showing drastically WORSE glycemic profile overall.  Her POCT A1c today is 11.6%, increasing from last visit of 9%.  Analysis of her CGM shows TIR 8%, TAR 92%, TBR 0% with a GMI of 10%.  She admits she has been forgetting her night time insulin  recently and she has been more stressed recently as well.) An ACE inhibitor/angiotensin II receptor blocker is being taken. She does not see a podiatrist.Eye exam is current.  Hypertension This is a chronic problem. The current episode started more than 1 year ago. The problem has  been resolved since onset. The problem is controlled. Associated symptoms include blurred vision. Pertinent negatives include no chest pain, headaches, palpitations or shortness of breath. There are no associated agents to hypertension. Risk factors for coronary artery disease include dyslipidemia, diabetes mellitus and sedentary lifestyle. Past treatments include ACE inhibitors. The current treatment provides moderate improvement. There are no compliance problems.   Hyperlipidemia This is a chronic problem. The current episode started more than 1 year ago. The problem is controlled. Recent lipid tests were reviewed and are normal. Exacerbating diseases include diabetes and obesity. There are no known factors aggravating her hyperlipidemia. Pertinent negatives include no chest pain or shortness of breath. Current antihyperlipidemic treatment includes statins. The current treatment provides moderate improvement of lipids. There are no compliance problems.  Risk factors for coronary artery disease include diabetes mellitus, hypertension, dyslipidemia and obesity.    Review of systems  Constitutional: + decreasing body weight,  current Body mass index is 24.74 kg/m. , no fatigue, no subjective hyperthermia, no subjective hypothermia, + polyuria and polydipsia-improving  Eyes: no blurry vision, no xerophthalmia ENT: no sore throat, no nodules palpated in throat, no dysphagia/odynophagia, no hoarseness Cardiovascular: no chest pain, no shortness of breath, no palpitations, no leg swelling Respiratory: no cough, no shortness of breath Gastrointestinal: no nausea/vomiting/diarrhea Musculoskeletal: no muscle/joint aches Skin: no rashes, no hyperemia Neurological: no tremors, no numbness, no tingling, no dizziness Psychiatric: no depression, no anxiety    Objective:    BP 106/70 (BP Location: Left Arm, Patient Position: Sitting, Cuff Size: Large)   Pulse (!) 104   Ht 5' 4.5 (1.638 m)   Wt 146 lb 6.4  oz (66.4 kg)   BMI 24.74 kg/m   Wt Readings from Last 3 Encounters:  08/16/24 146 lb 6.4 oz (66.4 kg)  05/10/24 152 lb 12.8 oz (69.3 kg)  02/02/24 135 lb 12.8 oz (61.6 kg)    BP Readings from Last 3 Encounters:  08/16/24 106/70  05/10/24 110/70  02/02/24 104/72     Physical Exam- Limited  Constitutional:  Body mass index is 24.74 kg/m. , not in acute distress, normal state of mind Eyes:  EOMI, no exophthalmos Musculoskeletal: no gross deformities, strength intact in all four extremities, no gross restriction of joint movements Skin:  no rashes, no hyperemia Neurological: no tremor with outstretched hands   Diabetic Foot Exam - Simple   No data filed     CMP ( most recent) CMP     Component Value Date/Time   NA 131 (L) 07/16/2023 1314   K 4.5 07/16/2023 1314   CL 94 (L) 07/16/2023 1314   CO2 20 07/16/2023 1314   GLUCOSE 395 (H) 07/16/2023 1314   GLUCOSE 79 01/14/2015 1720   BUN 10 07/16/2023 1314   CREATININE 0.85 07/16/2023 1314   CALCIUM  9.4 07/16/2023 1314   PROT 7.1 07/16/2023 1314   ALBUMIN 4.5 07/16/2023 1314   AST 10 07/16/2023 1314   ALT 9 07/16/2023 1314   ALKPHOS 36 (L) 07/16/2023 1314   BILITOT 1.1 07/16/2023 1314   GFRNONAA 98 08/01/2020 1024   GFRAA 113 08/01/2020 1024    Diabetic Labs (most recent): Lab Results  Component Value Date   HGBA1C 9.0 (A) 05/10/2024   HGBA1C 15.0 (A) 02/02/2024   HGBA1C 13.6 (A) 10/29/2023   MICROALBUR 30mg /L 04/19/2023   MICROALBUR 10 12/23/2021   MICROALBUR 30 11/12/2020     Lipid Panel ( most recent) Lipid Panel     Component Value Date/Time   CHOL 178 07/16/2023 1314   TRIG 204 (H) 07/16/2023 1314   HDL 50 07/16/2023 1314   CHOLHDL 3.6 07/16/2023 1314   LDLCALC 93 07/16/2023 1314     Assessment & Plan:   1) Uncontrolled type 2 diabetes mellitus with hyperglycemia (HCC)  - Patient has currently uncontrolled symptomatic type 2 DM since 52 years of age.  She presents today with her CGM showing  drastically WORSE glycemic profile overall.  Her POCT A1c today is 11.6%, increasing from last visit of 9%.  Analysis of her CGM shows TIR 8%, TAR 92%, TBR 0% with a GMI of 10%.  She admits she has been forgetting her night time insulin  recently and she has been more stressed recently as well.   -Recent labs reviewed.   - She does not report any gross complications  from her diabetes, however,  Monserrate Blaschke remains at a high risk for more acute and chronic complications which include CAD, CVA, CKD, retinopathy, and neuropathy. These are all discussed in detail with the patient.  The following  Lifestyle Medicine recommendations according to American College of Lifestyle Medicine [facility]) were discussed and offered to patient and she agrees to start the journey:  A. Whole Foods, Plant-based plate comprising of fruits and vegetables, plant-based proteins, whole-grain carbohydrates was discussed in detail with the patient.   A list for source of those nutrients were also provided to the patient.  Patient will use only water or unsweetened tea for hydration. B.  The need to stay away from risky substances including alcohol, smoking; obtaining 7 to 9 hours of restorative sleep, at least 150 minutes of moderate intensity exercise weekly, the importance of healthy social connections,  and stress reduction techniques were discussed. C.  A full color page of  Calorie density of various food groups per pound showing examples of each food groups was provided to the patient.  - Nutritional counseling repeated/built upon at each appointment.  - The patient admits there is a room for improvement in their diet and drink choices. -  Suggestion is made for the patient to avoid simple carbohydrates from their diet including Cakes, Sweet Desserts / Pastries, Ice Cream, Soda (diet and regular), Sweet Tea, Candies, Chips, Cookies, Sweet Pastries, Store Bought Juices, Alcohol in Excess of 1-2 drinks a day, Artificial  Sweeteners, Coffee Creamer, and Sugar-free Products. This will help patient to have stable blood glucose profile and potentially avoid unintended weight gain.   - I encouraged the patient to switch to unprocessed or minimally processed complex starch and increased protein intake (animal or plant source), fruits, and vegetables.   - Patient is advised to stick to a routine mealtimes to eat 3 meals a day and avoid unnecessary snacks (to snack only to correct hypoglycemia).  - I have approached her with the following individualized plan to manage diabetes and patient agrees:   -She is advised to continue her Semglee  40 SQ nightly (BUT TO BE CONSISTENT TAKING IT), continue Novolog  4-10 units TID with meals if glucose is above 90 and eating (Specific instructions on how to titrate insulin  dosage based on glucose readings given to patient in writing) via the Cequr.  She can stay off the Glipizide  for now.  Continue Janumet  50/1000 mg po twice daily.  -She is encouraged to continue using her CGM to monitor blood glucose 4 times daily, before meals and before bed, and to call the clinic if she has readings less than 70 or greater than 300 for 3 tests in a row.    - Patient specific target  A1c;  LDL, HDL, Triglycerides,  were discussed in detail.  2) BP/HTN:  Her blood pressure is controlled to target.  She is advised to continue current medications including Lisinopril  10 mg p.o. daily.    3) Lipids/HPL:  Her most recent lipid panel from 08/05/24 shows controlled LDL at 44.  She is advised to continue Lipitor 10 mg po daily at bedtime.  Side effects and precautions discussed with her.    4)  Weight/Diet:  Her Body mass index is 24.74 kg/m. - she is a candidate for modest weight loss.  CDE Consult has been  re-initiated , exercise, and detailed carbohydrates information provided.  5) Vitamin D  deficiency:  Her last vitamin D  level on 12/04/21 was 27.3.  She has completed replenishment with  Ergocalciferol .  She is taking Vitamin D3 2000 units daily as maintenance dose.     6) Chronic Care/Health Maintenance: -she is on ACEI and Statin medications and is encouraged to continue to follow up with Ophthalmology,  Dentist,  Podiatrist at least yearly or according to recommendations, and advised to stay away from smoking. I have recommended yearly flu vaccine and pneumonia vaccination at least every 5 years; moderate intensity exercise for up to 150 minutes weekly; and  sleep for at least 7 hours a day.   - I advised patient to maintain close follow up with Vasireddy, Sabitha, MD for primary care needs.     I spent  36  minutes in the care of the patient today including review of labs from CMP, Lipids, Thyroid Function, Hematology (current and previous including abstractions from other facilities); face-to-face time discussing  her blood glucose readings/logs, discussing hypoglycemia and hyperglycemia episodes and symptoms, medications doses, her options of short and long term treatment based on the latest standards of care / guidelines;  discussion about incorporating lifestyle medicine;  and documenting the encounter. Risk reduction counseling performed per USPSTF guidelines to reduce obesity and cardiovascular risk factors.     Please refer to Patient Instructions for Blood Glucose Monitoring and Insulin /Medications Dosing Guide  in media tab for additional information. Please  also refer to  Patient Self Inventory in the Media  tab for reviewed elements of pertinent patient history.  Sari Finner participated in the discussions, expressed understanding, and voiced agreement with the above plans.  All questions were answered to her satisfaction. she is encouraged to contact clinic should she have any questions or concerns prior to her return visit.    Follow up plan: - Return in about 4 months (around 12/14/2024) for Diabetes F/U with A1c in office, No previsit labs, Bring meter and  logs.  Benton Rio, [facility] [address] [place], [place] [address] Phone: [phone] Fax: [phone]  08/16/2024, 9:51 AM

## 2024-12-19 ENCOUNTER — Ambulatory Visit: Admitting: Nurse Practitioner
